# Patient Record
Sex: Female | Born: 1980 | Race: White | Hispanic: No | Marital: Married | Smoking: Former smoker
Health system: Southern US, Community
[De-identification: ages and names within clinical notes are randomized; demographics above are authoritative.]

## PROBLEM LIST (undated history)

## (undated) DIAGNOSIS — K219 Gastro-esophageal reflux disease without esophagitis: Secondary | ICD-10-CM

## (undated) DIAGNOSIS — R0989 Other specified symptoms and signs involving the circulatory and respiratory systems: Secondary | ICD-10-CM

## (undated) DIAGNOSIS — T7840XA Allergy, unspecified, initial encounter: Secondary | ICD-10-CM

## (undated) DIAGNOSIS — G43909 Migraine, unspecified, not intractable, without status migrainosus: Secondary | ICD-10-CM

## (undated) DIAGNOSIS — E282 Polycystic ovarian syndrome: Secondary | ICD-10-CM

## (undated) DIAGNOSIS — E119 Type 2 diabetes mellitus without complications: Secondary | ICD-10-CM

## (undated) DIAGNOSIS — J45909 Unspecified asthma, uncomplicated: Secondary | ICD-10-CM

## (undated) HISTORY — DX: Polycystic ovarian syndrome: E28.2

## (undated) HISTORY — DX: Gastro-esophageal reflux disease without esophagitis: K21.9

## (undated) HISTORY — DX: Other specified symptoms and signs involving the circulatory and respiratory systems: R09.89

## (undated) HISTORY — DX: Unspecified asthma, uncomplicated: J45.909

## (undated) HISTORY — DX: Allergy, unspecified, initial encounter: T78.40XA

## (undated) HISTORY — PX: CHOLECYSTECTOMY: SHX55

## (undated) HISTORY — DX: Migraine, unspecified, not intractable, without status migrainosus: G43.909

---
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
## 2000-09-28 HISTORY — PX: OVARIAN CYST REMOVAL: SHX89

## 2006-05-26 ENCOUNTER — Ambulatory Visit: Payer: Self-pay | Admitting: Unknown Physician Specialty

## 2006-07-29 ENCOUNTER — Emergency Department: Payer: Self-pay | Admitting: General Practice

## 2008-03-27 ENCOUNTER — Emergency Department: Payer: Self-pay | Admitting: Emergency Medicine

## 2008-03-28 ENCOUNTER — Other Ambulatory Visit: Payer: Self-pay

## 2008-08-26 ENCOUNTER — Emergency Department: Payer: Self-pay | Admitting: Internal Medicine

## 2012-08-08 ENCOUNTER — Ambulatory Visit: Payer: Self-pay | Admitting: Family Medicine

## 2012-09-04 ENCOUNTER — Emergency Department: Payer: Self-pay | Admitting: Emergency Medicine

## 2012-09-04 LAB — URINALYSIS, COMPLETE
Bilirubin,UR: NEGATIVE
Blood: NEGATIVE
Glucose,UR: NEGATIVE mg/dL (ref 0–75)
Ketone: NEGATIVE
Nitrite: NEGATIVE
Protein: NEGATIVE
RBC,UR: NONE SEEN /HPF (ref 0–5)
Specific Gravity: 1.003 (ref 1.003–1.030)

## 2012-09-06 LAB — URINE CULTURE

## 2012-09-27 ENCOUNTER — Ambulatory Visit: Payer: Self-pay | Admitting: Family Medicine

## 2012-10-20 ENCOUNTER — Ambulatory Visit: Payer: Self-pay

## 2012-10-27 ENCOUNTER — Ambulatory Visit: Payer: Self-pay | Admitting: Emergency Medicine

## 2012-11-07 ENCOUNTER — Ambulatory Visit: Payer: Self-pay | Admitting: Emergency Medicine

## 2012-11-07 LAB — HCG, QUANTITATIVE, PREGNANCY: Beta Hcg, Quant.: <1 m[IU]/mL — ABNORMAL LOW

## 2012-11-29 HISTORY — PX: UPPER GI ENDOSCOPY: SHX6162

## 2013-01-25 ENCOUNTER — Ambulatory Visit: Payer: Self-pay | Admitting: Oncology

## 2013-01-25 LAB — CBC CANCER CENTER
Basophil #: 0.1 x10 3/mm (ref 0.0–0.1)
Basophil %: 0.8 %
Eosinophil #: 0 x10 3/mm (ref 0.0–0.7)
HCT: 38.9 % (ref 35.0–47.0)
Lymphocyte #: 2.2 x10 3/mm (ref 1.0–3.6)
Lymphocyte %: 27.9 %
MCHC: 33.6 g/dL (ref 32.0–36.0)
MCV: 84 fL (ref 80–100)
Monocyte #: 0.5 x10 3/mm (ref 0.2–0.9)
Neutrophil #: 5.2 x10 3/mm (ref 1.4–6.5)
Platelet: 478 x10 3/mm — ABNORMAL HIGH (ref 150–440)
RDW: 12.6 % (ref 11.5–14.5)

## 2013-01-25 LAB — LACTATE DEHYDROGENASE: LDH: 146 U/L (ref 81–246)

## 2013-01-26 ENCOUNTER — Ambulatory Visit: Payer: Self-pay | Admitting: Oncology

## 2013-04-26 ENCOUNTER — Ambulatory Visit: Payer: Self-pay | Admitting: Oncology

## 2013-04-26 LAB — CBC CANCER CENTER
Basophil %: 0.5 %
Eosinophil %: 0.5 %
HGB: 13.6 g/dL (ref 12.0–16.0)
Lymphocyte #: 2.7 x10 3/mm (ref 1.0–3.6)
Lymphocyte %: 22 %
MCHC: 35.1 g/dL (ref 32.0–36.0)
MCV: 86 fL (ref 80–100)
Monocyte #: 0.8 x10 3/mm (ref 0.2–0.9)
Neutrophil #: 8.8 x10 3/mm — ABNORMAL HIGH (ref 1.4–6.5)
Neutrophil %: 70.9 %
RDW: 13.1 % (ref 11.5–14.5)

## 2013-04-28 ENCOUNTER — Ambulatory Visit: Payer: Self-pay | Admitting: Oncology

## 2013-05-18 ENCOUNTER — Ambulatory Visit (INDEPENDENT_AMBULATORY_CARE_PROVIDER_SITE_OTHER): Payer: BC Managed Care – PPO | Admitting: General Surgery

## 2013-05-18 ENCOUNTER — Encounter: Payer: Self-pay | Admitting: General Surgery

## 2013-05-18 VITALS — BP 98/58 | HR 92 | Resp 12 | Ht 67.0 in | Wt 187.0 lb

## 2013-05-18 DIAGNOSIS — K811 Chronic cholecystitis: Secondary | ICD-10-CM

## 2013-05-18 NOTE — Patient Instructions (Addendum)
Laparoscopic Cholecystectomy Laparoscopic cholecystectomy is surgery to remove the gallbladder. The gallbladder is located slightly to the right of center in the abdomen, behind the liver. It is a concentrating and storage sac for the bile produced in the liver. Bile aids in the digestion and absorption of fats. Gallbladder disease (cholecystitis) is an inflammation of your gallbladder. This condition is usually caused by a buildup of gallstones (cholelithiasis) in your gallbladder. Gallstones can block the flow of bile, resulting in inflammation and pain. In severe cases, emergency surgery may be required. When emergency surgery is not required, you will have time to prepare for the procedure. Laparoscopic surgery is an alternative to open surgery. Laparoscopic surgery usually has a shorter recovery time. Your common bile duct may also need to be examined and explored. Your caregiver will discuss this with you if he or she feels this should be done. If stones are found in the common bile duct, they may be removed. LET YOUR CAREGIVER KNOW ABOUT:  Allergies to food or medicine.  Medicines taken, including vitamins, herbs, eyedrops, over-the-counter medicines, and creams.  Use of steroids (by mouth or creams).  Previous problems with anesthetics or numbing medicines.  History of bleeding problems or blood clots.  Previous surgery.  Other health problems, including diabetes and kidney problems.  Possibility of pregnancy, if this applies. RISKS AND COMPLICATIONS All surgery is associated with risks. Some problems that may occur following this procedure include:  Infection.  Damage to the common bile duct, nerves, arteries, veins, or other internal organs such as the stomach or intestines.  Bleeding.  A stone may remain in the common bile duct. BEFORE THE PROCEDURE  Do not take aspirin for 3 days prior to surgery or blood thinners for 1 week prior to surgery.  Do not eat or drink  anything after midnight the night before surgery.  Let your caregiver know if you develop a cold or other infectious problem prior to surgery.  You should be present 60 minutes before the procedure or as directed. PROCEDURE  You will be given medicine that makes you sleep (general anesthetic). When you are asleep, your surgeon will make several small cuts (incisions) in your abdomen. One of these incisions is used to insert a small, lighted scope (laparoscope) into the abdomen. The laparoscope helps the surgeon see into your abdomen. Carbon dioxide gas will be pumped into your abdomen. The gas allows more room for the surgeon to perform your surgery. Other operating instruments are inserted through the other incisions. Laparoscopic procedures may not be appropriate when:  There is major scarring from previous surgery.  The gallbladder is extremely inflamed.  There are bleeding disorders or unexpected cirrhosis of the liver.  A pregnancy is near term.  Other conditions make the laparoscopic procedure impossible. If your surgeon feels it is not safe to continue with a laparoscopic procedure, he or she will perform an open abdominal procedure. In this case, the surgeon will make an incision to open the abdomen. This gives the surgeon a larger view and field to work within. This may allow the surgeon to perform procedures that sometimes cannot be performed with a laparoscope alone. Open surgery has a longer recovery time. AFTER THE PROCEDURE  You will be taken to the recovery area where a nurse will watch and check your progress.  You may be allowed to go home the same day.  Do not resume physical activities until directed by your caregiver.  You may resume a normal diet and   activities as directed. Document Released: 09/14/2005 Document Revised: 12/07/2011 Document Reviewed: 02/27/2011 [facility] Patient Information 2014 [place], [place].   Laparoscopic Cholecystectomy with Intraoperative  Cholangiogram. The procedure, including it's potential risks and complications (including but not limited to infection, bleeding, injury to intra-abdominal organs or bile ducts, bile leak, poor cosmetic result, sepsis and death) were discussed with the patient in detail. Non-operative options, including their inherent risks (acute calculous cholecystitis with possible choledocholithiasis or gallstone pancreatitis, with the risk of ascending cholangitis, sepsis, and death) were discussed as well. The patient expressed and understanding of what we discussed and wishes to proceed with laparoscopic cholecystectomy. The patient further understands that if it is technically not possible, or it is unsafe to proceed laparoscopically, that I will convert to an open cholecystectomy.

## 2013-05-18 NOTE — Progress Notes (Signed)
Patient ID: LILLIANNE EICK, female   DOB: 02-20-1981, 32 y.o.   MRN: [id number]  Chief Complaint  Patient presents with  . Other    chronic cholecystitis    HPI ALAENA STRADER is a 32 y.o. female who presents for an evaluation of the gallbladder referred by Owens Shark NP. States that for about 1 year on/off she has been having right abdominal pain and "gallbladder attacks".  States she has severe pain RUQ and to the back with nausea, vomiting and diarrhea with gas and pressure.  Seems to be triggered by all foods. Symptoms usually last for about one week.  States weight has been up and down.  Diagnosed with GERD during this time and Protonix was increased to twice a day.  She had a HIDA scan CT scan and ultrasound done.  Records from the GI service were reviewed. The case was discussed with Owens Shark, NP from GI. HPI  Past Medical History  Diagnosis Date  . GERD (gastroesophageal reflux disease)   . PCOS (polycystic ovarian syndrome)   . Allergy   . Sinus complaint     Past Surgical History  Procedure Laterality Date  . Ovarian cyst removal Bilateral 2002  . Upper gi endoscopy  11-29-12    Dr Bluford Kaufmann    Family History  Problem Relation Age of Onset  . Diabetes Mother   . Diabetes Father     Social History History  Substance Use Topics  . Smoking status: Former Smoker -- 12 years  . Smokeless tobacco: Not on file  . Alcohol Use: Yes    No Known Allergies  Current Outpatient Prescriptions  Medication Sig Dispense Refill  . acetaminophen (TYLENOL) 500 MG tablet Take 500 mg by mouth every 6 (six) hours as needed for pain.      Marland Kitchen albuterol (PROVENTIL HFA;VENTOLIN HFA) 108 (90 BASE) MCG/ACT inhaler Inhale 2 puffs into the lungs every 6 (six) hours as needed for wheezing.      Marland Kitchen aspirin 81 MG tablet Take 81 mg by mouth daily.      . cetirizine (ZYRTEC) 10 MG tablet Take 10 mg by mouth daily.      . citalopram (CELEXA) 20 MG tablet Take 20 mg by mouth daily.      .  diphenhydrAMINE (BENADRYL) 25 mg capsule Take 25 mg by mouth every 6 (six) hours as needed for itching.      . fluticasone (FLONASE) 50 MCG/ACT nasal spray Place 2 sprays into the nose daily.      Marland Kitchen levonorgestrel-ethinyl estradiol (NORDETTE) 0.15-30 MG-MCG tablet Take 1 tablet by mouth daily.      . Melatonin 3 MG TABS Take 1 tablet by mouth at bedtime.      . metFORMIN (GLUCOPHAGE) 500 MG tablet Take 500 mg by mouth 2 (two) times daily with a meal.      . montelukast (SINGULAIR) 10 MG tablet Take 10 mg by mouth at bedtime.      . ondansetron (ZOFRAN) 8 MG tablet Take 8 mg by mouth every 8 (eight) hours as needed for nausea.      . pantoprazole (PROTONIX) 20 MG tablet Take 20 mg by mouth 2 (two) times daily.      . phentermine 37.5 MG capsule Take 37.5 mg by mouth daily.      . Simethicone (GAS RELIEF PO) Take 1 capsule by mouth daily.      . SUMAtriptan (IMITREX) 100 MG tablet Take 100 mg by mouth daily as  needed for migraine.       No current facility-administered medications for this visit.    Review of Systems Review of Systems  Constitutional: Negative.   Respiratory: Negative.   Cardiovascular: Negative.     Blood pressure 98/58, pulse 92, resp. rate 12, height 5\' 7"  (1.702 m), weight 187 lb (84.823 kg), last menstrual period 05/03/2013.  Physical Exam Physical Exam  Constitutional: She is oriented to person, place, and time. She appears well-developed and well-nourished.  Eyes: Conjunctivae are normal.  Neck: Neck supple.  Cardiovascular: Normal rate and regular rhythm.   Pulmonary/Chest: Effort normal and breath sounds normal.  Abdominal: Bowel sounds are normal. There is tenderness in the right upper quadrant.  Lymphadenopathy:    She has no cervical adenopathy.  Neurological: She is alert and oriented to person, place, and time.  Skin: Skin is warm and dry.    Data Reviewed The patient's previous HIDA scan, abdominal ultrasound and CT scans were reviewed. The upper  endoscopy report was reviewed. Biopsies from her upper endoscopy showed evidence of active chronic inflammation of gastric mucosa.  Laboratory studies dated November 22, 2012 showed a hemoglobin A1c of 5.8 Loprox is upper limit of normal 5.6. Normal basic metabolic panel. Normal TSH. Gallbladder wall thickness 2.2 mm, upper limit of normal. No gallstones. The appendix ejection fraction 50% with reproduction of symptoms. CT scan of the abdomen and pelvis showed no acute abnormality.  Assessment    Chronic right upper quadrant pain, possible chronic cholecystitis.     Plan    The patient has no lower GI symptoms to suggest colonic spasm as the source of her pain. She has had a complete and thorough diagnostic workup with the only finding being reproduction of her symptoms with CCK injection and a slightly thickened gallbladder wall.  The possibility of relief of her symptoms with lateral cholecystectomy is estimated at 50-75%. A good deal of time was spent taken today to the patient that there is no guarantee that cholecystectomy will relieve all of her symptoms.  Special emphasis was placed on the risks of the procedure, not so much as to scare her away from elective surgery, to be sure that she was not grasping at a last straw.  Uncomfortable she is aware of the risks of the procedure and understands that no guarantee as been made regarding relief of symptoms.       Earline Mayotte 05/19/2013, 9:11 PM

## 2013-05-19 ENCOUNTER — Other Ambulatory Visit: Payer: Self-pay | Admitting: General Surgery

## 2013-05-19 ENCOUNTER — Encounter: Payer: Self-pay | Admitting: General Surgery

## 2013-05-19 DIAGNOSIS — K811 Chronic cholecystitis: Secondary | ICD-10-CM

## 2013-05-19 HISTORY — DX: Chronic cholecystitis: K81.1

## 2013-06-09 ENCOUNTER — Ambulatory Visit: Payer: Self-pay | Admitting: General Surgery

## 2013-06-09 DIAGNOSIS — K811 Chronic cholecystitis: Secondary | ICD-10-CM

## 2013-06-09 HISTORY — PX: CHOLECYSTECTOMY: SHX55

## 2013-06-12 ENCOUNTER — Encounter: Payer: Self-pay | Admitting: General Surgery

## 2013-06-12 LAB — PATHOLOGY REPORT

## 2013-06-13 ENCOUNTER — Encounter: Payer: Self-pay | Admitting: General Surgery

## 2013-06-14 ENCOUNTER — Encounter: Payer: Self-pay | Admitting: General Surgery

## 2013-06-21 ENCOUNTER — Ambulatory Visit: Payer: BC Managed Care – PPO | Admitting: General Surgery

## 2013-06-29 ENCOUNTER — Encounter: Payer: Self-pay | Admitting: General Surgery

## 2013-06-29 ENCOUNTER — Ambulatory Visit (INDEPENDENT_AMBULATORY_CARE_PROVIDER_SITE_OTHER): Payer: BC Managed Care – PPO | Admitting: General Surgery

## 2013-06-29 VITALS — BP 130/90 | HR 76 | Resp 16 | Ht 67.0 in | Wt 192.0 lb

## 2013-06-29 DIAGNOSIS — K811 Chronic cholecystitis: Secondary | ICD-10-CM

## 2013-06-29 NOTE — Progress Notes (Signed)
Patient ID: Ashley Kidd, female   DOB: 03-26-1981, 32 y.o.   MRN: [id number]  Chief Complaint  Patient presents with  . Routine Post Op    HPI Ashley Kidd is a 32 y.o. female.  Postoperative visit laparoscopy cholecystectomy done 06-09-13. States she is so glad she had surgery and feels so much better now and is able to eat. The adhesives irritated her skin some but it is better now.  HPI  Past Medical History  Diagnosis Date  . GERD (gastroesophageal reflux disease)   . PCOS (polycystic ovarian syndrome)   . Allergy   . Sinus complaint     Past Surgical History  Procedure Laterality Date  . Ovarian cyst removal Bilateral 2002  . Upper gi endoscopy  11-29-12    Dr Bluford Kaufmann  . Cholecystectomy  06-09-13    Family History  Problem Relation Age of Onset  . Diabetes Mother   . Diabetes Father     Social History History  Substance Use Topics  . Smoking status: Former Smoker -- 12 years  . Smokeless tobacco: Not on file  . Alcohol Use: Yes    No Known Allergies  Current Outpatient Prescriptions  Medication Sig Dispense Refill  . acetaminophen (TYLENOL) 500 MG tablet Take 500 mg by mouth every 6 (six) hours as needed for pain.      Marland Kitchen albuterol (PROVENTIL HFA;VENTOLIN HFA) 108 (90 BASE) MCG/ACT inhaler Inhale 2 puffs into the lungs every 6 (six) hours as needed for wheezing.      Marland Kitchen aspirin 81 MG tablet Take 81 mg by mouth daily.      . cetirizine (ZYRTEC) 10 MG tablet Take 10 mg by mouth daily.      . citalopram (CELEXA) 20 MG tablet Take 20 mg by mouth daily.      . diphenhydrAMINE (BENADRYL) 25 mg capsule Take 25 mg by mouth every 6 (six) hours as needed for itching.      . fluticasone (FLONASE) 50 MCG/ACT nasal spray Place 2 sprays into the nose daily.      Marland Kitchen levonorgestrel-ethinyl estradiol (NORDETTE) 0.15-30 MG-MCG tablet Take 1 tablet by mouth daily.      . Melatonin 3 MG TABS Take 1 tablet by mouth at bedtime.      . metFORMIN (GLUCOPHAGE) 500 MG tablet Take 500 mg by  mouth 2 (two) times daily with a meal.      . montelukast (SINGULAIR) 10 MG tablet Take 10 mg by mouth at bedtime.      . pantoprazole (PROTONIX) 20 MG tablet Take 20 mg by mouth 2 (two) times daily.      . phentermine 37.5 MG capsule Take 37.5 mg by mouth daily.      . SUMAtriptan (IMITREX) 100 MG tablet Take 100 mg by mouth daily as needed for migraine.       No current facility-administered medications for this visit.    Review of Systems Review of Systems  Blood pressure 130/90, pulse 76, resp. rate 16, height 5\' 7"  (1.702 m), weight 192 lb (87.091 kg), last menstrual period 06/29/2013.  Physical Exam Physical Exam  Constitutional: She is oriented to person, place, and time. She appears well-developed and well-nourished.  Pulmonary/Chest: Effort normal and breath sounds normal.  Abdominal: Soft. Normal appearance.  All incisions are healing well.  Neurological: She is alert and oriented to person, place, and time.  Skin: Skin is warm and dry.   The skin inferior to the medial lateral incision shows  a small less than 1 cm square area of depigmentation.  Data Reviewed Pathology showed chronic cholecystitis and cholelithiasis. (Previous ultrasound had not shown evidence of gallstones). Assessment    Doing well status post cholecystectomy.    Plan    Care with resumption of her exercise program was emphasized.       Earline Mayotte 06/29/2013, 12:17 PM

## 2013-06-29 NOTE — Patient Instructions (Signed)
The patient is aware to call back for any questions or concerns. Use one arm or one leg at a time for weight lifting.

## 2013-07-26 ENCOUNTER — Ambulatory Visit: Payer: Self-pay | Admitting: Oncology

## 2013-07-26 LAB — CBC CANCER CENTER
Basophil %: 0.6 %
Eosinophil #: 0 x10 3/mm (ref 0.0–0.7)
HCT: 41.5 % (ref 35.0–47.0)
Lymphocyte %: 29.4 %
MCH: 29.1 pg (ref 26.0–34.0)
MCHC: 33.4 g/dL (ref 32.0–36.0)
MCV: 87 fL (ref 80–100)
Neutrophil %: 62.3 %
RBC: 4.75 10*6/uL (ref 3.80–5.20)
WBC: 8 x10 3/mm (ref 3.6–11.0)

## 2013-07-29 ENCOUNTER — Ambulatory Visit: Payer: Self-pay | Admitting: Oncology

## 2013-10-25 ENCOUNTER — Ambulatory Visit: Payer: Self-pay | Admitting: Oncology

## 2013-10-25 LAB — CBC CANCER CENTER
Basophil #: 0.1 x10 3/mm (ref 0.0–0.1)
Basophil %: 0.6 %
EOS ABS: 0.1 x10 3/mm (ref 0.0–0.7)
EOS PCT: 0.7 %
HCT: 40.8 % (ref 35.0–47.0)
HGB: 13.8 g/dL (ref 12.0–16.0)
LYMPHS ABS: 2.7 x10 3/mm (ref 1.0–3.6)
LYMPHS PCT: 28.2 %
MCH: 29.2 pg (ref 26.0–34.0)
MCHC: 33.7 g/dL (ref 32.0–36.0)
MCV: 86 fL (ref 80–100)
MONO ABS: 0.6 x10 3/mm (ref 0.2–0.9)
MONOS PCT: 6.4 %
NEUTROS ABS: 6.2 x10 3/mm (ref 1.4–6.5)
NEUTROS PCT: 64.1 %
PLATELETS: 484 x10 3/mm — AB (ref 150–440)
RBC: 4.72 10*6/uL (ref 3.80–5.20)
RDW: 12.8 % (ref 11.5–14.5)
WBC: 9.7 x10 3/mm (ref 3.6–11.0)

## 2013-10-29 ENCOUNTER — Ambulatory Visit: Payer: Self-pay | Admitting: Oncology

## 2014-01-24 ENCOUNTER — Ambulatory Visit: Payer: Self-pay | Admitting: Oncology

## 2014-01-24 LAB — CBC CANCER CENTER
BASOS ABS: 0.1 x10 3/mm (ref 0.0–0.1)
Basophil %: 0.6 %
Eosinophil #: 0.1 x10 3/mm (ref 0.0–0.7)
Eosinophil %: 1.1 %
HCT: 41 % (ref 35.0–47.0)
HGB: 13.5 g/dL (ref 12.0–16.0)
Lymphocyte #: 2.7 x10 3/mm (ref 1.0–3.6)
Lymphocyte %: 28.2 %
MCH: 28.6 pg (ref 26.0–34.0)
MCHC: 33 g/dL (ref 32.0–36.0)
MCV: 87 fL (ref 80–100)
MONO ABS: 0.7 x10 3/mm (ref 0.2–0.9)
Monocyte %: 7.4 %
Neutrophil #: 6 x10 3/mm (ref 1.4–6.5)
Neutrophil %: 62.7 %
PLATELETS: 466 x10 3/mm — AB (ref 150–440)
RBC: 4.73 10*6/uL (ref 3.80–5.20)
RDW: 13.1 % (ref 11.5–14.5)
WBC: 9.5 x10 3/mm (ref 3.6–11.0)

## 2014-01-26 ENCOUNTER — Ambulatory Visit: Payer: Self-pay | Admitting: Oncology

## 2014-04-25 ENCOUNTER — Ambulatory Visit: Payer: Self-pay | Admitting: Oncology

## 2014-04-25 LAB — CBC CANCER CENTER
BASOS ABS: 0 x10 3/mm (ref 0.0–0.1)
Basophil %: 0.5 %
EOS ABS: 0.1 x10 3/mm (ref 0.0–0.7)
Eosinophil %: 0.9 %
HCT: 39.5 % (ref 35.0–47.0)
HGB: 13.3 g/dL (ref 12.0–16.0)
Lymphocyte #: 2 x10 3/mm (ref 1.0–3.6)
Lymphocyte %: 21.3 %
MCH: 28.9 pg (ref 26.0–34.0)
MCHC: 33.6 g/dL (ref 32.0–36.0)
MCV: 86 fL (ref 80–100)
Monocyte #: 0.5 x10 3/mm (ref 0.2–0.9)
Monocyte %: 5 %
NEUTROS PCT: 72.3 %
Neutrophil #: 6.8 x10 3/mm — ABNORMAL HIGH (ref 1.4–6.5)
PLATELETS: 475 x10 3/mm — AB (ref 150–440)
RBC: 4.6 10*6/uL (ref 3.80–5.20)
RDW: 13.3 % (ref 11.5–14.5)
WBC: 9.5 x10 3/mm (ref 3.6–11.0)

## 2014-04-28 ENCOUNTER — Ambulatory Visit: Payer: Self-pay | Admitting: Oncology

## 2014-07-30 ENCOUNTER — Encounter: Payer: Self-pay | Admitting: General Surgery

## 2014-11-23 ENCOUNTER — Ambulatory Visit: Payer: Self-pay

## 2015-01-03 ENCOUNTER — Ambulatory Visit: Payer: Self-pay | Admitting: Podiatry

## 2015-01-18 NOTE — Op Note (Signed)
PATIENT NAME:  Ashley Kidd, Ashley Kidd MR#:  [id number] DATE OF BIRTH:  Nov 08, 1980  DATE OF PROCEDURE:  06/09/2013  PREOPERATIVE DIAGNOSIS:  Chronic right upper quadrant abdominal pain.   POSTOPERATIVE DIAGNOSIS:  Chronic right upper quadrant abdominal pain.  OPERATIVE PROCEDURE:  Laparoscopic cholecystectomy with intraoperative cholangiogram.   SURGEON:  Hervey Ard, M.D.   ANESTHESIA:  General endotracheal under Dr. Myra Gianotti.   ESTIMATED BLOOD LOSS:  Minimal.   CLINICAL NOTE:  This 34 year old woman has had a significant history of postprandial pain confined to the right upper quadrant.  No particular dietary intolerance had been noted.  Upper endoscopy, CT and ultrasound were unremarkable.  While her gallbladder ejection fraction was 50%, there was reproduction of her symptoms with the CCK injection and she was felt to be a candidate for elective cholecystectomy.   OPERATIVE NOTE:  With the patient under adequate general anesthesia, the abdomen was prepped with ChloraPrep and draped.  A Veress needle was placed through a transumbilical incision after ensuring intra-abdominal location with the hanging drop test, the abdomen was insufflated with CO2 at 10 mmHg pressure.  Inspection after placement of a 10 mm step port showed no evidence of injury from initial port placement.  The anterior abdominal wall was unremarkable.  The patient was placed into reverse Trendelenburg position and rolled to the left.  An 11 mm Xcel port was placed in the epigastrium and two 5 mm step ports placed laterally.  The gallbladder was placed on a cephalad traction.  There was noted to be significant adhesions between the second portion of the duodenum, just past the duodenal bulb and the neck of the gallbladder.  This was carefully taken down with blunt and cautery dissection.  Inspection showed no evidence of a fistula between the duodenum and the gallbladder.  The area of the duodenum was re-examined at the end of the  procedure and was found to be normal.  The scar tissue around the neck of the gallbladder was cleared and fluoroscopic cholangiograms completed using 8 mL of one-half strength Conray 60.  This showed prompt filling of the right and left hepatic ducts and free flow into the distal common bile duct and into the duodenum.  No evidence of stones.  The cystic duct and multiple small branches of the cystic artery were clipped adjacent to the gallbladder.  It was then removed from the liver bed making use of hook cautery dissection.  It was delivered through the umbilical port site without incident.  Inspection from the epigastric site showed no evidence of injury from initial port placement.  The right upper quadrant was irrigated as a modest amount of bile had been spilled during aspiration of the moderately distended gallbladder at the beginning of the procedure.  Hemostasis was excellent.  The abdomen was then desufflated and ports removed under direct vision.  Skin incisions were closed with 4-0 Vicryl subcuticular sutures.  Benzoin, Steri-Strips, Telfa and Tegaderm dressings were applied.    The patient tolerated the procedure well and was taken to the recovery room in stable condition.    ____________________________ Robert Bellow, MD jwb:ea D: 06/09/2013 16:27:06 ET T: 06/09/2013 23:30:51 ET JOB#: [id number]  cc: Robert Bellow, MD, <Dictator> Payton Emerald, NP Outside Physician Guadalupe Maple, MD Robert Bellow, MD Santosha Jividen Amedeo Kinsman MD ELECTRONICALLY SIGNED 06/11/2013 10:20

## 2015-03-26 ENCOUNTER — Ambulatory Visit (INDEPENDENT_AMBULATORY_CARE_PROVIDER_SITE_OTHER): Payer: Managed Care, Other (non HMO) | Admitting: Family Medicine

## 2015-03-26 ENCOUNTER — Encounter: Payer: Self-pay | Admitting: Family Medicine

## 2015-03-26 VITALS — BP 137/89 | HR 123 | Temp 98.1°F | Wt 221.4 lb

## 2015-03-26 DIAGNOSIS — M545 Low back pain, unspecified: Secondary | ICD-10-CM | POA: Insufficient documentation

## 2015-03-26 MED ORDER — IBUPROFEN 600 MG PO TABS: 600.0000 mg | ORAL_TABLET | Freq: Three times a day (TID) | ORAL | Status: DC | PRN

## 2015-03-26 MED ORDER — CYCLOBENZAPRINE HCL 10 MG PO TABS
10.0000 mg | ORAL_TABLET | Freq: Three times a day (TID) | ORAL | Status: DC | PRN
Start: 2015-03-26 — End: 2015-09-03

## 2015-03-26 NOTE — Progress Notes (Signed)
BP 137/89 mmHg  Pulse 123  Temp(Src) 98.1 F (36.7 C) (Oral)  Wt 221 lb 6.4 oz (100.426 kg)  SpO2 98%  LMP 03/08/2015   Subjective:    Patient ID: Ashley Kidd, female    DOB: [birth year], 34 y.o.   MRN: [id number]  HPI: Ashley Kidd is a 34 y.o. female  Chief Complaint  Patient presents with  . Back Pain    Spine hurt toward middle of back   BACK PAIN Duration: 5 days Mechanism of injury: no trauma Location: middle of her back and bilateral Onset: sudden Severity: severe Quality: sharp and dull Frequency: constant Radiation: none Aggravating factors: bending and prolonged sitting Alleviating factors: nothing Status: stable Treatments attempted: rest, ice, heat, APAP, ibuprofen and aleve  Relief with NSAIDs?: no Nighttime pain:  no Paresthesias / decreased sensation:  no Bowel / bladder incontinence:  no Fevers:  no Dysuria / urinary frequency:  no  Relevant past medical, surgical, family and social history reviewed and updated as indicated. Interim medical history since our last visit reviewed. Allergies and medications reviewed and updated.  Review of Systems  HENT: Negative.   Respiratory: Negative.   Cardiovascular: Negative.   Musculoskeletal: Positive for myalgias, back pain and gait problem. Negative for joint swelling, arthralgias, neck pain and neck stiffness.  Neurological: Negative.   Psychiatric/Behavioral: Negative.     Per HPI unless specifically indicated above     Objective:    BP 137/89 mmHg  Pulse 123  Temp(Src) 98.1 F (36.7 C) (Oral)  Wt 221 lb 6.4 oz (100.426 kg)  SpO2 98%  LMP 03/08/2015  Wt Readings from Last 3 Encounters:  03/26/15 221 lb 6.4 oz (100.426 kg)  06/29/13 192 lb (87.091 kg)  05/18/13 187 lb (84.823 kg)    Physical Exam  Constitutional: She is oriented to person, place, and time. She appears well-developed and well-nourished. No distress.  HENT:  Head: Normocephalic and atraumatic.  Right Ear: Hearing  normal.  Left Ear: Hearing normal.  Nose: Nose normal.  Eyes: Conjunctivae and lids are normal. Right eye exhibits no discharge. Left eye exhibits no discharge. No scleral icterus.  Pulmonary/Chest: Effort normal. No respiratory distress.  Neurological: She is alert and oriented to person, place, and time.  Skin: Skin is warm, dry and intact. No rash noted. No erythema. No pallor.  Psychiatric: She has a normal mood and affect. Her speech is normal and behavior is normal. Judgment and thought content normal. Cognition and memory are normal.  Back Exam:    Inspection:  Normal spinal curvature.  No deformity, ecchymosis, erythema, or lesions paraspinal muscle spasm T10-L4 bilaterally R.>L    Palpation:     Midline spinal tenderness: no      Paralumbar tenderness: yesR>L     Parathoracic tenderness: yesR>L     Buttocks tenderness: no     Range of Motion:      Flexion: Fingers to Knees     Extension:Decreased     Lateral bending:Decreased    Rotation:Decreased    Neuro Exam:Lower extremity DTRs normal & symmetric.  Strength and sensation intact.    Special Tests:      Straight leg raise:negative     Assessment & Plan:   Problem List Items Addressed This Visit      Other   Low back pain - Primary    Patient seems to be in an acute exacerbation of her back pain. Information about stretching given to patient. Will start her on 600mg   of ibuprofen TID and cyclobenzaprine TID when she doesn't have to operate heavy machinery or be responsible for anything. Work on stretching. Don't sit for too long. Follow up in 1 week if not doing better.        Relevant Medications   ibuprofen (ADVIL,MOTRIN) 600 MG tablet   cyclobenzaprine (FLEXERIL) 10 MG tablet       Follow up plan: Return in about 1 week (around 04/02/2015).

## 2015-03-26 NOTE — Patient Instructions (Signed)
Back Exercises These exercises may help you when beginning to rehabilitate your injury. Your symptoms may resolve with or without further involvement from your physician, physical therapist or athletic trainer. While completing these exercises, remember:   Restoring tissue flexibility helps normal motion to return to the joints. This allows healthier, less painful movement and activity.  An effective stretch should be held for at least 30 seconds.  A stretch should never be painful. You should only feel a gentle lengthening or release in the stretched tissue. STRETCH - Extension, Prone on Elbows   Lie on your stomach on the floor, a bed will be too soft. Place your palms about shoulder width apart and at the height of your head.  Place your elbows under your shoulders. If this is too painful, stack pillows under your chest.  Allow your body to relax so that your hips drop lower and make contact more completely with the floor.  Hold this position for __________ seconds.  Slowly return to lying flat on the floor. Repeat __________ times. Complete this exercise __________ times per day.  RANGE OF MOTION - Extension, Prone Press Ups   Lie on your stomach on the floor, a bed will be too soft. Place your palms about shoulder width apart and at the height of your head.  Keeping your back as relaxed as possible, slowly straighten your elbows while keeping your hips on the floor. You may adjust the placement of your hands to maximize your comfort. As you gain motion, your hands will come more underneath your shoulders.  Hold this position __________ seconds.  Slowly return to lying flat on the floor. Repeat __________ times. Complete this exercise __________ times per day.  RANGE OF MOTION- Quadruped, Neutral Spine   Assume a hands and knees position on a firm surface. Keep your hands under your shoulders and your knees under your hips. You may place padding under your knees for  comfort.  Drop your head and point your tail bone toward the ground below you. This will round out your low back like an angry cat. Hold this position for __________ seconds.  Slowly lift your head and release your tail bone so that your back sags into a large arch, like an old horse.  Hold this position for __________ seconds.  Repeat this until you feel limber in your low back.  Now, find your "sweet spot." This will be the most comfortable position somewhere between the two previous positions. This is your neutral spine. Once you have found this position, tense your stomach muscles to support your low back.  Hold this position for __________ seconds. Repeat __________ times. Complete this exercise __________ times per day.  STRETCH - Flexion, Single Knee to Chest   Lie on a firm bed or floor with both legs extended in front of you.  Keeping one leg in contact with the floor, bring your opposite knee to your chest. Hold your leg in place by either grabbing behind your thigh or at your knee.  Pull until you feel a gentle stretch in your low back. Hold __________ seconds.  Slowly release your grasp and repeat the exercise with the opposite side. Repeat __________ times. Complete this exercise __________ times per day.  STRETCH - Hamstrings, Standing  Stand or sit and extend your right / left leg, placing your foot on a chair or foot stool  Keeping a slight arch in your low back and your hips straight forward.  Lead with your chest and   lean forward at the waist until you feel a gentle stretch in the back of your right / left knee or thigh. (When done correctly, this exercise requires leaning only a small distance.)  Hold this position for __________ seconds. Repeat __________ times. Complete this stretch __________ times per day. STRENGTHENING - Deep Abdominals, Pelvic Tilt   Lie on a firm bed or floor. Keeping your legs in front of you, bend your knees so they are both pointed  toward the ceiling and your feet are flat on the floor.  Tense your lower abdominal muscles to press your low back into the floor. This motion will rotate your pelvis so that your tail bone is scooping upwards rather than pointing at your feet or into the floor.  With a gentle tension and even breathing, hold this position for __________ seconds. Repeat __________ times. Complete this exercise __________ times per day.  STRENGTHENING - Abdominals, Crunches   Lie on a firm bed or floor. Keeping your legs in front of you, bend your knees so they are both pointed toward the ceiling and your feet are flat on the floor. Cross your arms over your chest.  Slightly tip your chin down without bending your neck.  Tense your abdominals and slowly lift your trunk high enough to just clear your shoulder blades. Lifting higher can put excessive stress on the low back and does not further strengthen your abdominal muscles.  Control your return to the starting position. Repeat __________ times. Complete this exercise __________ times per day.  STRENGTHENING - Quadruped, Opposite UE/LE Lift   Assume a hands and knees position on a firm surface. Keep your hands under your shoulders and your knees under your hips. You may place padding under your knees for comfort.  Find your neutral spine and gently tense your abdominal muscles so that you can maintain this position. Your shoulders and hips should form a rectangle that is parallel with the floor and is not twisted.  Keeping your trunk steady, lift your right hand no higher than your shoulder and then your left leg no higher than your hip. Make sure you are not holding your breath. Hold this position __________ seconds.  Continuing to keep your abdominal muscles tense and your back steady, slowly return to your starting position. Repeat with the opposite arm and leg. Repeat __________ times. Complete this exercise __________ times per day. Document Released:  10/02/2005 Document Revised: 12/07/2011 Document Reviewed: 12/27/2008 [facility] Patient Information 2015 [place], [place]. This information is not intended to replace advice given to you by your health care provider. Make sure you discuss any questions you have with your health care provider. Back Pain, Adult Low back pain is very common. About 1 in 5 people have back pain.The cause of low back pain is rarely dangerous. The pain often gets better over time.About half of people with a sudden onset of back pain feel better in just 2 weeks. About 8 in 10 people feel better by 6 weeks.  CAUSES Some common causes of back pain include:  Strain of the muscles or ligaments supporting the spine.  Wear and tear (degeneration) of the spinal discs.  Arthritis.  Direct injury to the back. DIAGNOSIS Most of the time, the direct cause of low back pain is not known.However, back pain can be treated effectively even when the exact cause of the pain is unknown.Answering your caregiver's questions about your overall health and symptoms is one of the most accurate ways to make sure  the cause of your pain is not dangerous. If your caregiver needs more information, he or she may order lab work or imaging tests (X-rays or MRIs).However, even if imaging tests show changes in your back, this usually does not require surgery. HOME CARE INSTRUCTIONS For many people, back pain returns.Since low back pain is rarely dangerous, it is often a condition that people can learn to [facility] their own.   Remain active. It is stressful on the back to sit or stand in one place. Do not sit, drive, or stand in one place for more than 30 minutes at a time. Take short walks on level surfaces as soon as pain allows.Try to increase the length of time you walk each day.  Do not stay in bed.Resting more than 1 or 2 days can delay your recovery.  Do not avoid exercise or work.Your body is made to move.It is not dangerous to be  active, even though your back may hurt.Your back will likely heal faster if you return to being active before your pain is gone.  Pay attention to your body when you bend and lift. Many people have less discomfortwhen lifting if they bend their knees, keep the load close to their bodies,and avoid twisting. Often, the most comfortable positions are those that put less stress on your recovering back.  Find a comfortable position to sleep. Use a firm mattress and lie on your side with your knees slightly bent. If you lie on your back, put a pillow under your knees.  Only take over-the-counter or prescription medicines as directed by your caregiver. Over-the-counter medicines to reduce pain and inflammation are often the most helpful.Your caregiver may prescribe muscle relaxant drugs.These medicines help dull your pain so you can more quickly return to your normal activities and healthy exercise.  Put ice on the injured area.  Put ice in a plastic bag.  Place a towel between your skin and the bag.  Leave the ice on for 15-20 minutes, 03-04 times a day for the first 2 to 3 days. After that, ice and heat may be alternated to reduce pain and spasms.  Ask your caregiver about trying back exercises and gentle massage. This may be of some benefit.  Avoid feeling anxious or stressed.Stress increases muscle tension and can worsen back pain.It is important to recognize when you are anxious or stressed and learn ways to manage it.Exercise is a great option. SEEK MEDICAL CARE IF:  You have pain that is not relieved with rest or medicine.  You have pain that does not improve in 1 week.  You have new symptoms.  You are generally not feeling well. SEEK IMMEDIATE MEDICAL CARE IF:   You have pain that radiates from your back into your legs.  You develop new bowel or bladder control problems.  You have unusual weakness or numbness in your arms or legs.  You develop nausea or vomiting.  You  develop abdominal pain.  You feel faint. Document Released: 09/14/2005 Document Revised: 03/15/2012 Document Reviewed: 01/16/2014 [facility] Patient Information 2015 [place], [place]. This information is not intended to replace advice given to you by your health care provider. Make sure you discuss any questions you have with your health care provider.

## 2015-03-26 NOTE — Assessment & Plan Note (Signed)
Patient seems to be in an acute exacerbation of her back pain. Information about stretching given to patient. Will start her on 600mg  of ibuprofen TID and cyclobenzaprine TID when she doesn't have to operate heavy machinery or be responsible for anything. Work on stretching. Don't sit for too long. Follow up in 1 week if not doing better.

## 2015-04-04 DIAGNOSIS — T7840XA Allergy, unspecified, initial encounter: Secondary | ICD-10-CM | POA: Insufficient documentation

## 2015-04-04 DIAGNOSIS — G43909 Migraine, unspecified, not intractable, without status migrainosus: Secondary | ICD-10-CM | POA: Insufficient documentation

## 2015-04-04 DIAGNOSIS — J45909 Unspecified asthma, uncomplicated: Secondary | ICD-10-CM | POA: Insufficient documentation

## 2015-04-05 ENCOUNTER — Encounter: Payer: Self-pay | Admitting: Family Medicine

## 2015-04-05 ENCOUNTER — Ambulatory Visit (INDEPENDENT_AMBULATORY_CARE_PROVIDER_SITE_OTHER): Payer: Managed Care, Other (non HMO) | Admitting: Family Medicine

## 2015-04-05 VITALS — BP 133/85 | HR 97 | Temp 98.5°F | Ht 66.7 in | Wt 223.6 lb

## 2015-04-05 DIAGNOSIS — M545 Low back pain, unspecified: Secondary | ICD-10-CM

## 2015-04-05 MED ORDER — IBUPROFEN 600 MG PO TABS: 600.0000 mg | ORAL_TABLET | Freq: Three times a day (TID) | ORAL | Status: DC | PRN

## 2015-04-05 NOTE — Progress Notes (Addendum)
BP 133/85 mmHg  Pulse 97  Temp(Src) 98.5 F (36.9 C)  Ht 5' 6.7" (1.694 m)  Wt 223 lb 9.6 oz (101.424 kg)  BMI 35.34 kg/m2  SpO2 97%  LMP 04/04/2015   Subjective:    Patient ID: Ashley Kidd, female    DOB: [birth year], 34 y.o.   MRN: [id number]  HPI: Ashley Kidd is a 34 y.o. female  Chief Complaint  Patient presents with  . Back Pain    pt was seen for back spasms and stated they have gotten better but they are still there   BACK PAIN- doing a bit better, but still in a lot of spasm. Can only take the cyclobenzaprine on nights that her husband is home because it makes her so sleepy she can't get up with the kids.  Duration: 2 weeks Mechanism of injury: unknown Location: R>L and low back Onset: sudden Severity: severe Quality: sharp, dull and pulling Frequency: constant Radiation: none Aggravating factors: lifting, movement, walking, laying and bending Alleviating factors: rest, ice, heat, NSAIDs and muscle relaxer Status: better Treatments attempted: rest, ice, heat and ibuprofen  Relief with NSAIDs?: moderate Nighttime pain:  no Paresthesias / decreased sensation:  no Bowel / bladder incontinence:  no Fevers:  no Dysuria / urinary frequency:  no  Relevant past medical, surgical, family and social history reviewed and updated as indicated. Interim medical history since our last visit reviewed. Allergies and medications reviewed and updated.  Review of Systems  Constitutional: Negative.   Respiratory: Negative.   Cardiovascular: Negative.   Gastrointestinal: Negative.   Musculoskeletal: Negative.   Psychiatric/Behavioral: Negative.     Per HPI unless specifically indicated above     Objective:    BP 133/85 mmHg  Pulse 97  Temp(Src) 98.5 F (36.9 C)  Ht 5' 6.7" (1.694 m)  Wt 223 lb 9.6 oz (101.424 kg)  BMI 35.34 kg/m2  SpO2 97%  LMP 04/04/2015  Wt Readings from Last 3 Encounters:  04/05/15 223 lb 9.6 oz (101.424 kg)  12/10/14 213 lb (96.616  kg)  03/26/15 221 lb 6.4 oz (100.426 kg)    Physical Exam  Constitutional: She is oriented to person, place, and time. She appears well-developed and well-nourished. No distress.  HENT:  Head: Normocephalic and atraumatic.  Right Ear: Hearing normal.  Left Ear: Hearing normal.  Nose: Nose normal.  Eyes: Conjunctivae and lids are normal. Right eye exhibits no discharge. Left eye exhibits no discharge. No scleral icterus.  Cardiovascular: Normal rate, regular rhythm and normal heart sounds.  Exam reveals no gallop and no friction rub.   No murmur heard. Pulmonary/Chest: Effort normal. No respiratory distress. She has no wheezes. She has no rales. She exhibits no tenderness.  Neurological: She is alert and oriented to person, place, and time.  Skin: Skin is intact. No rash noted.  Psychiatric: She has a normal mood and affect. Her speech is normal and behavior is normal. Judgment and thought content normal. Cognition and memory are normal.  Nursing note and vitals reviewed. Back Exam:    Inspection:  Normal spinal curvature.  No deformity, ecchymosis, erythema, or lesions, paraspinal spasm bilaterally L>R  Palpation:     Midline spinal tenderness: no   Paralumbar tenderness: yesL>R     Parathoracic tenderness: no     Buttocks tenderness: yesL>R     Range of Motion:      Flexion: Fingers to Knees     Extension:Decreased     Lateral bending:Decreased    Rotation:Decreased  Neuro Exam:Lower extremity DTRs normal & symmetric.  Strength and sensation intact.    Special Tests:      Straight leg raise:negative   Results for orders placed or performed in visit on 04/25/14  [facility]  Result Value Ref Range   WBC 9.5 3.6-11.0 x10 3/mm    RBC 4.60 3.80-5.20 x10 6/mm    HGB 13.3 12.0-16.0 g/dL   HCT 39.5 35.0-47.0 %   MCV 86 80-100 fL   MCH 28.9 26.0-34.0 pg   MCHC 33.6 32.0-36.0 g/dL   RDW 13.3 11.5-14.5 %   Platelet 475 (H) 150-440 x10 3/mm    Neutrophil % 72.3 %    Lymphocyte % 21.3 %   Monocyte % 5.0 %   Eosinophil % 0.9 %   Basophil % 0.5 %   Neutrophil # 6.8 (H) 1.4-6.5 x10 3/mm    Lymphocyte # 2.0 1.0-3.6 x10 3/mm    Monocyte # 0.5 0.2-0.9 x10 3/mm    Eosinophil # 0.1 0.0-0.7 x10 3/mm    Basophil # 0.0 0.0-0.1 x10 3/mm       Assessment & Plan:   Problem List Items Addressed This Visit      Other   Low back pain - Primary    Not significantly better. Can't tolerate increased muscle relaxer. Continue NSAID. Continue to monitor. Will send to PT. Referral to [facility] generated today and order given to patient. Follow up in 3 weeks or sooner if not getting better.       Relevant Medications   ibuprofen (ADVIL,MOTRIN) 600 MG tablet       Follow up plan: Return in about 3 weeks (around 04/26/2015).

## 2015-04-05 NOTE — Assessment & Plan Note (Signed)
Not significantly better. Can't tolerate increased muscle relaxer. Continue NSAID. Continue to monitor. Will send to PT. Referral to [facility] generated today and order given to patient. Follow up in 3 weeks or sooner if not getting better.

## 2015-04-29 ENCOUNTER — Ambulatory Visit: Payer: Managed Care, Other (non HMO) | Admitting: Family Medicine

## 2015-06-01 ENCOUNTER — Other Ambulatory Visit: Payer: Self-pay | Admitting: Unknown Physician Specialty

## 2015-08-06 ENCOUNTER — Ambulatory Visit: Payer: Managed Care, Other (non HMO) | Admitting: Unknown Physician Specialty

## 2015-09-03 ENCOUNTER — Emergency Department
Admission: EM | Admit: 2015-09-03 | Discharge: 2015-09-03 | Disposition: A | Discharge status: Home / Self Care | Payer: No Typology Code available for payment source | Attending: Emergency Medicine | Admitting: Emergency Medicine

## 2015-09-03 ENCOUNTER — Emergency Department: Payer: No Typology Code available for payment source

## 2015-09-03 ENCOUNTER — Encounter: Payer: Self-pay | Admitting: *Deleted

## 2015-09-03 DIAGNOSIS — Y9389 Activity, other specified: Secondary | ICD-10-CM | POA: Insufficient documentation

## 2015-09-03 DIAGNOSIS — S161XXA Strain of muscle, fascia and tendon at neck level, initial encounter: Secondary | ICD-10-CM | POA: Insufficient documentation

## 2015-09-03 DIAGNOSIS — S0990XA Unspecified injury of head, initial encounter: Secondary | ICD-10-CM | POA: Insufficient documentation

## 2015-09-03 DIAGNOSIS — Z87891 Personal history of nicotine dependence: Secondary | ICD-10-CM | POA: Insufficient documentation

## 2015-09-03 DIAGNOSIS — Z79899 Other long term (current) drug therapy: Secondary | ICD-10-CM | POA: Insufficient documentation

## 2015-09-03 DIAGNOSIS — S0093XA Contusion of unspecified part of head, initial encounter: Secondary | ICD-10-CM

## 2015-09-03 DIAGNOSIS — Y998 Other external cause status: Secondary | ICD-10-CM | POA: Insufficient documentation

## 2015-09-03 DIAGNOSIS — Z7982 Long term (current) use of aspirin: Secondary | ICD-10-CM | POA: Insufficient documentation

## 2015-09-03 DIAGNOSIS — Z7951 Long term (current) use of inhaled steroids: Secondary | ICD-10-CM | POA: Insufficient documentation

## 2015-09-03 DIAGNOSIS — Y92410 Unspecified street and highway as the place of occurrence of the external cause: Secondary | ICD-10-CM | POA: Insufficient documentation

## 2015-09-03 DIAGNOSIS — Z793 Long term (current) use of hormonal contraceptives: Secondary | ICD-10-CM | POA: Insufficient documentation

## 2015-09-03 DIAGNOSIS — S0083XA Contusion of other part of head, initial encounter: Secondary | ICD-10-CM | POA: Insufficient documentation

## 2015-09-03 DIAGNOSIS — Z7984 Long term (current) use of oral hypoglycemic drugs: Secondary | ICD-10-CM | POA: Insufficient documentation

## 2015-09-03 MED ORDER — CYCLOBENZAPRINE HCL 10 MG PO TABS: 10.0000 mg | ORAL_TABLET | Freq: Three times a day (TID) | ORAL | Status: DC | PRN

## 2015-09-03 MED ORDER — CYCLOBENZAPRINE HCL 10 MG PO TABS
10.0000 mg | ORAL_TABLET | Freq: Once | ORAL | Status: AC
  Administered 2015-09-03: 10 mg via ORAL
  Filled 2015-09-03: qty 1

## 2015-09-03 MED ORDER — IBUPROFEN 800 MG PO TABS
800.0000 mg | ORAL_TABLET | Freq: Once | ORAL | Status: AC
  Administered 2015-09-03: 800 mg via ORAL
  Filled 2015-09-03: qty 1

## 2015-09-03 MED ORDER — IBUPROFEN 800 MG PO TABS: 800.0000 mg | ORAL_TABLET | Freq: Three times a day (TID) | ORAL | Status: DC | PRN
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
## 2015-09-10 ENCOUNTER — Encounter: Payer: Self-pay | Admitting: Family Medicine

## 2015-09-10 ENCOUNTER — Ambulatory Visit (INDEPENDENT_AMBULATORY_CARE_PROVIDER_SITE_OTHER): Payer: Managed Care, Other (non HMO) | Admitting: Family Medicine

## 2015-09-10 VITALS — BP 126/88 | HR 108 | Temp 98.1°F | Ht 66.2 in | Wt 211.0 lb

## 2015-09-10 DIAGNOSIS — M9903 Segmental and somatic dysfunction of lumbar region: Secondary | ICD-10-CM

## 2015-09-10 DIAGNOSIS — M545 Low back pain, unspecified: Secondary | ICD-10-CM

## 2015-09-10 DIAGNOSIS — M9905 Segmental and somatic dysfunction of pelvic region: Secondary | ICD-10-CM | POA: Diagnosis not present

## 2015-09-10 DIAGNOSIS — M9908 Segmental and somatic dysfunction of rib cage: Secondary | ICD-10-CM

## 2015-09-10 DIAGNOSIS — M9904 Segmental and somatic dysfunction of sacral region: Secondary | ICD-10-CM

## 2015-09-10 DIAGNOSIS — M9902 Segmental and somatic dysfunction of thoracic region: Secondary | ICD-10-CM
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
## 2015-09-29 ENCOUNTER — Telehealth: Payer: Self-pay | Admitting: Family

## 2015-09-29 DIAGNOSIS — R05 Cough: Secondary | ICD-10-CM

## 2015-09-29 DIAGNOSIS — R059 Cough, unspecified: Secondary | ICD-10-CM

## 2015-09-29 MED ORDER — BENZONATATE 100 MG PO CAPS: 100.0000 mg | ORAL_CAPSULE | Freq: Three times a day (TID) | ORAL | Status: DC | PRN

## 2015-09-29 NOTE — Progress Notes (Signed)
We are sorry that you are not feeling well.  Here is how we plan to help!  Based on what you have shared with me it looks like you have upper respiratory tract inflammation that has resulted in a significant cough.  Inflammation and infection in the upper respiratory tract is commonly called bronchitis and has four common causes:  Allergies, Viral Infections, Acid Reflux and Bacterial Infections.  Allergies, viruses and acid reflux are treated by controlling symptoms or eliminating the cause. An example might be a cough caused by taking certain blood pressure medications. You stop the cough by changing the medication. Another example might be a cough caused by acid reflux. Controlling the reflux helps control the cough.  Based on your presentation I believe you most likely have A cough due to a virus.  This is called viral bronchitis and is best treated by rest, plenty of fluids and control of the cough.  You may use Ibuprofen or Tylenol as directed to help your symptoms.    In addition you may use A non-prescription cough medication called Mucinex DM: take 2 tablets every 12 hours. and A prescription cough medication called Tessalon Perles 100mg. You may take 1-2 capsules every 8 hours as needed for your cough.    HOME CARE . Only take medications as instructed by your medical team. . Complete the entire course of an antibiotic. . Drink plenty of fluids and get plenty of rest. . Avoid close contacts especially the very young and the elderly . Cover your mouth if you cough or cough into your sleeve. . Always remember to wash your hands . A steam or ultrasonic humidifier can help congestion.    GET HELP RIGHT AWAY IF: . You develop worsening fever. . You become short of breath . You cough up blood. . Your symptoms persist after you have completed your treatment plan MAKE SURE YOU   Understand these instructions.  Will watch your condition.  Will get help right away if you are not doing  well or get worse.  Your e-visit answers were reviewed by a board certified advanced clinical practitioner to complete your personal care plan.  Depending on the condition, your plan could have included both over the counter or prescription medications. If there is a problem please reply  once you have received a response from your provider. Your safety is important to us.  If you have drug allergies check your prescription carefully.    You can use MyChart to ask questions about today's visit, request a non-urgent call back, or ask for a work or school excuse for 24 hours related to this e-Visit. If it has been greater than 24 hours you will need to follow up with your provider, or enter a new e-Visit to address those concerns. You will get an e-mail in the next two days asking about your experience.  I hope that your e-visit has been valuable and will speed your recovery. Thank you for using e-visits.   

## 2015-10-28 ENCOUNTER — Telehealth: Payer: Self-pay

## 2015-10-28 ENCOUNTER — Ambulatory Visit (INDEPENDENT_AMBULATORY_CARE_PROVIDER_SITE_OTHER): Payer: Managed Care, Other (non HMO) | Admitting: Unknown Physician Specialty

## 2015-10-28 ENCOUNTER — Encounter: Payer: Self-pay | Admitting: Unknown Physician Specialty

## 2015-10-28 VITALS — BP 124/88 | HR 116 | Temp 98.3°F | Ht 66.7 in | Wt 214.2 lb

## 2015-10-28 DIAGNOSIS — R05 Cough: Secondary | ICD-10-CM

## 2015-10-28 DIAGNOSIS — E785 Hyperlipidemia, unspecified: Secondary | ICD-10-CM | POA: Diagnosis not present

## 2015-10-28 DIAGNOSIS — J069 Acute upper respiratory infection, unspecified: Secondary | ICD-10-CM

## 2015-10-28 DIAGNOSIS — K529 Noninfective gastroenteritis and colitis, unspecified: Secondary | ICD-10-CM | POA: Diagnosis not present

## 2015-10-28 DIAGNOSIS — R1031 Right lower quadrant pain: Secondary | ICD-10-CM | POA: Diagnosis not present

## 2015-10-28 DIAGNOSIS — R059 Cough, unspecified: Secondary | ICD-10-CM

## 2015-10-28 LAB — CBC WITH DIFFERENTIAL/PLATELET
HEMATOCRIT: 35.9 % (ref 34.0–46.6)
HEMOGLOBIN: 12.3 g/dL (ref 11.1–15.9)
LYMPHS ABS: 2.8 10*3/uL (ref 0.7–3.1)
Lymphs: 25 %
MCH: 28 pg (ref 26.6–33.0)
MCHC: 34.3 g/dL (ref 31.5–35.7)
MCV: 82 fL (ref 79–97)
MID (Absolute): 0.7 10*3/uL (ref 0.1–1.6)
MID: 7 %
NEUTROS ABS: 7.4 10*3/uL — AB (ref 1.4–7.0)
NEUTROS PCT: 68 %
Platelets: 521 10*3/uL — ABNORMAL HIGH (ref 150–379)
RBC: 4.39 x10E6/uL (ref 3.77–5.28)
RDW: 14.9 % (ref 12.3–15.4)
WBC: 10.9 10*3/uL — AB (ref 3.4–10.8)

## 2015-10-28 LAB — INFLUENZA A AND B
INFLUENZA A AG, EIA: NEGATIVE
INFLUENZA B AG, EIA: NEGATIVE

## 2015-10-28 LAB — PLEASE NOTE:

## 2015-10-28 MED ORDER — PROMETHAZINE HCL 25 MG PO TABS: 25.0000 mg | ORAL_TABLET | Freq: Four times a day (QID) | ORAL | Status: DC | PRN

## 2015-10-28 NOTE — Progress Notes (Signed)
BP 124/88 mmHg  Pulse 116  Temp(Src) 98.3 F (36.8 C)  Ht 5' 6.7" (1.694 m)  Wt 214 lb 3.2 oz (97.16 kg)  BMI 33.86 kg/m2  SpO2 97%  LMP 10/18/2015 (Exact Date)   Subjective:    Patient ID: Ashley Kidd, female    DOB: 08-Sep-1981, 35 y.o.   MRN: [id number]  HPI: Ashley Kidd is a 35 y.o. female  Chief Complaint  Patient presents with  . URI    pt states she has nausea, vomitting, diarrhea, nasla congestion, headache, sinus pressure, and a cough. States symptoms started Thursday.   URI  This is a new problem. Episode onset: started 4 days ago. The problem has been waxing and waning. Associated symptoms include abdominal pain, congestion, coughing, diarrhea, headaches, nausea, rhinorrhea, sinus pain, a sore throat and vomiting. Associated symptoms comments: Aches all over.    Relevant past medical, surgical, family and social history reviewed and updated as indicated. Interim medical history since our last visit reviewed. Allergies and medications reviewed and updated.  Review of Systems  HENT: Positive for congestion, rhinorrhea and sore throat.   Respiratory: Positive for cough.   Gastrointestinal: Positive for nausea, vomiting, abdominal pain and diarrhea.  Neurological: Positive for headaches.     Per HPI unless specifically indicated above     Objective:    BP 124/88 mmHg  Pulse 116  Temp(Src) 98.3 F (36.8 C)  Ht 5' 6.7" (1.694 m)  Wt 214 lb 3.2 oz (97.16 kg)  BMI 33.86 kg/m2  SpO2 97%  LMP 10/18/2015 (Exact Date)  Wt Readings from Last 3 Encounters:  10/28/15 214 lb 3.2 oz (97.16 kg)  09/10/15 211 lb (95.709 kg)  09/03/15 211 lb (95.709 kg)    Physical Exam  Constitutional: She is oriented to person, place, and time. She appears well-developed and well-nourished. No distress.  HENT:  Head: Normocephalic and atraumatic.  Right Ear: Tympanic membrane and ear canal normal.  Left Ear: Tympanic membrane and ear canal normal.  Nose: Rhinorrhea  present. Right sinus exhibits no maxillary sinus tenderness and no frontal sinus tenderness. Left sinus exhibits no maxillary sinus tenderness and no frontal sinus tenderness.  Mouth/Throat: Mucous membranes are normal. Posterior oropharyngeal erythema present.  Eyes: Conjunctivae and lids are normal. Right eye exhibits no discharge. Left eye exhibits no discharge. No scleral icterus.  Cardiovascular: Normal rate and regular rhythm.   Pulmonary/Chest: Effort normal and breath sounds normal. No respiratory distress.  Abdominal: Soft. Normal appearance. There is no splenomegaly or hepatomegaly. There is tenderness in the right lower quadrant. There is no guarding, no tenderness at McBurney's point and negative Murphy's sign.  CBC was normal  Musculoskeletal: Normal range of motion.  Neurological: She is alert and oriented to person, place, and time.  Skin: Skin is intact. No rash noted. No pallor.  Psychiatric: She has a normal mood and affect. Her behavior is normal. Judgment and thought content normal.    Results for orders placed or performed in visit on 04/25/14  [facility]  Result Value Ref Range   WBC 9.5 3.6-11.0 x10 3/mm    RBC 4.60 3.80-5.20 x10 6/mm    HGB 13.3 12.0-16.0 g/dL   HCT 39.5 35.0-47.0 %   MCV 86 80-100 fL   MCH 28.9 26.0-34.0 pg   MCHC 33.6 32.0-36.0 g/dL   RDW 13.3 11.5-14.5 %   Platelet 475 (H) 150-440 x10 3/mm    Neutrophil % 72.3 %   Lymphocyte % 21.3 %  Monocyte % 5.0 %   Eosinophil % 0.9 %   Basophil % 0.5 %   Neutrophil # 6.8 (H) 1.4-6.5 x10 3/mm    Lymphocyte # 2.0 1.0-3.6 x10 3/mm    Monocyte # 0.5 0.2-0.9 x10 3/mm    Eosinophil # 0.1 0.0-0.7 x10 3/mm    Basophil # 0.0 0.0-0.1 x10 3/mm       Assessment & Plan:   Problem List Items Addressed This Visit    None    Visit Diagnoses    Cough    -  Primary    Relevant Orders    Influenza a and b    Right lower quadrant abdominal pain        Relevant Orders    CBC With  Differential/Platelet    Hyperlipidemia        Upper respiratory infection        Gastroenteritis           Rx for Phenergan.  Drink fluids as much as possible.  Liberal use of Albuterol.    Follow up plan: Return if symptoms worsen or fail to improve.

## 2015-10-28 NOTE — Telephone Encounter (Signed)
Patient stated she got her flu shot at the pharmacy so I called them and they told me she got it 07/10/15.

## 2015-12-12 ENCOUNTER — Telehealth: Payer: No Typology Code available for payment source | Admitting: Physician Assistant

## 2015-12-12 DIAGNOSIS — B9689 Other specified bacterial agents as the cause of diseases classified elsewhere: Secondary | ICD-10-CM

## 2015-12-12 DIAGNOSIS — J208 Acute bronchitis due to other specified organisms: Principal | ICD-10-CM

## 2015-12-12 DIAGNOSIS — J00 Acute nasopharyngitis [common cold]: Secondary | ICD-10-CM

## 2015-12-12 MED ORDER — DOXYCYCLINE HYCLATE 100 MG PO CAPS: 100.0000 mg | ORAL_CAPSULE | Freq: Two times a day (BID) | ORAL | Status: DC

## 2015-12-12 NOTE — Progress Notes (Signed)
We are sorry that you are not feeling well.  Here is how we plan to help!  Based on what you have shared with me it looks like you have upper respiratory tract inflammation that has resulted in a significant cough.  Inflammation and infection in the upper respiratory tract is commonly called bronchitis and has four common causes:  Allergies, Viral Infections, Acid Reflux and Bacterial Infections.  Allergies, viruses and acid reflux are treated by controlling symptoms or eliminating the cause. An example might be a cough caused by taking certain blood pressure medications. You stop the cough by changing the medication. Another example might be a cough caused by acid reflux. Controlling the reflux helps control the cough.  Based on your presentation I believe you most likely have A cough due to bacteria.  When patients have a fever and a productive cough with a change in color or increased sputum production, we are concerned about bacterial bronchitis.  If left untreated it can progress to pneumonia.  If your symptoms do not improve with your treatment plan it is important that you contact your provider.   I hve prescribed Doxycycline 100 mg twice a day for 7 days    In addition you may use A prescription cough medication called Tessalon Perles 100mg . You may take 1-2 capsules every 8 hours as needed for your cough.      HOME CARE . Only take medications as instructed by your medical team. . Complete the entire course of an antibiotic. . Drink plenty of fluids and get plenty of rest. . Avoid close contacts especially the very young and the elderly . Cover your mouth if you cough or cough into your sleeve. . Always remember to wash your hands . A steam or ultrasonic humidifier can help congestion.    GET HELP RIGHT AWAY IF: . You develop worsening fever. . You become short of breath . You cough up blood. . Your symptoms persist after you have completed your treatment plan MAKE SURE YOU    Understand these instructions.  Will watch your condition.  Will get help right away if you are not doing well or get worse.  Your e-visit answers were reviewed by a board certified advanced clinical practitioner to complete your personal care plan.  Depending on the condition, your plan could have included both over the counter or prescription medications. If there is a problem please reply  once you have received a response from your provider. Your safety is important to [place].  If you have drug allergies check your prescription carefully.    You can use MyChart to ask questions about today's visit, request a non-urgent call back, or ask for a work or school excuse for 24 hours related to this e-Visit. If it has been greater than 24 hours you will need to follow up with your provider, or enter a new e-Visit to address those concerns. You will get an e-mail in the next two days asking about your experience.  I hope that your e-visit has been valuable and will speed your recovery. Thank you for using e-visits.

## 2015-12-19 ENCOUNTER — Encounter: Payer: Self-pay | Admitting: Family Medicine

## 2015-12-19 ENCOUNTER — Ambulatory Visit (INDEPENDENT_AMBULATORY_CARE_PROVIDER_SITE_OTHER): Payer: Managed Care, Other (non HMO) | Admitting: Family Medicine

## 2015-12-19 VITALS — BP 126/87 | HR 125 | Temp 97.5°F | Ht 66.8 in | Wt 209.0 lb

## 2015-12-19 DIAGNOSIS — R300 Dysuria: Secondary | ICD-10-CM

## 2015-12-19 DIAGNOSIS — N3001 Acute cystitis with hematuria: Secondary | ICD-10-CM

## 2015-12-19 LAB — UA/M W/RFLX CULTURE, ROUTINE
Bilirubin, UA: NEGATIVE
GLUCOSE, UA: NEGATIVE
KETONES UA: NEGATIVE
Leukocytes, UA: NEGATIVE
NITRITE UA: NEGATIVE
PH UA: 6.5 (ref 5.0–7.5)
Protein, UA: NEGATIVE
Specific Gravity, UA: 1.01 (ref 1.005–1.030)
UUROB: 0.2 mg/dL (ref 0.2–1.0)

## 2015-12-19 LAB — MICROSCOPIC EXAMINATION: WBC UA: NONE SEEN /HPF (ref 0–?)

## 2015-12-19 MED ORDER — CIPROFLOXACIN HCL 250 MG PO TABS: 250.0000 mg | ORAL_TABLET | Freq: Two times a day (BID) | ORAL | Status: DC

## 2015-12-19 NOTE — Progress Notes (Signed)
BP 126/87 mmHg  Pulse 125  Temp(Src) 97.5 F (36.4 C)  Ht 5' 6.8" (1.697 m)  Wt 209 lb (94.802 kg)  BMI 32.92 kg/m2  SpO2 99%  LMP 12/12/2015 (Exact Date)   Subjective:    Patient ID: Ashley Kidd, female    DOB: [birth year], 35 y.o.   MRN: [id number]  HPI: Ashley Kidd is a 35 y.o. female  Chief Complaint  Patient presents with  . Urinary Tract Infection    pain and burning, kidney pain   URINARY SYMPTOMS- on doxycycline for bronchitis, taking last dose tonight Duration: 2 days Dysuria: yes Urinary frequency: no, hard to get it out Urgency: no Small volume voids: yes Symptom severity: severe Urinary incontinence: no Foul odor: no Hematuria: no Abdominal pain: no Back pain: yes Suprapubic pain/pressure: no Flank pain: no Fever:  No, but sweats Vomiting: yes Relief with cranberry juice: no Relief with pyridium: no Status: worse Previous urinary tract infection: yes Recurrent urinary tract infection: no Sexual activity: monogomous History of sexually transmitted disease: no Vaginal discharge: no Treatments attempted: antibiotics, pyridium, cranberry and increasing fluids   Relevant past medical, surgical, family and social history reviewed and updated as indicated. Interim medical history since our last visit reviewed. Allergies and medications reviewed and updated.  Review of Systems  Constitutional: Negative.   Respiratory: Negative.   Cardiovascular: Negative.   Gastrointestinal: Positive for abdominal pain. Negative for nausea, vomiting, diarrhea, constipation, blood in stool, abdominal distention, anal bleeding and rectal pain.  Genitourinary: Positive for dysuria and frequency. Negative for urgency, hematuria, flank pain, decreased urine volume, vaginal bleeding, vaginal discharge, enuresis, difficulty urinating, genital sores, vaginal pain, menstrual problem, pelvic pain and dyspareunia.  Psychiatric/Behavioral: Negative.     Per HPI unless  specifically indicated above     Objective:    BP 126/87 mmHg  Pulse 125  Temp(Src) 97.5 F (36.4 C)  Ht 5' 6.8" (1.697 m)  Wt 209 lb (94.802 kg)  BMI 32.92 kg/m2  SpO2 99%  LMP 12/12/2015 (Exact Date)  Wt Readings from Last 3 Encounters:  12/19/15 209 lb (94.802 kg)  10/28/15 214 lb 3.2 oz (97.16 kg)  09/10/15 211 lb (95.709 kg)    Physical Exam  Constitutional: She is oriented to person, place, and time. She appears well-developed and well-nourished. No distress.  HENT:  Head: Normocephalic and atraumatic.  Right Ear: Hearing normal.  Left Ear: Hearing normal.  Nose: Nose normal.  Eyes: Conjunctivae and lids are normal. Right eye exhibits no discharge. Left eye exhibits no discharge. No scleral icterus.  Cardiovascular: Normal rate, regular rhythm, normal heart sounds and intact distal pulses.  Exam reveals no gallop and no friction rub.   No murmur heard. Pulmonary/Chest: Effort normal and breath sounds normal. No respiratory distress. She has no wheezes. She has no rales. She exhibits no tenderness.  Abdominal: Soft. Bowel sounds are normal. She exhibits no distension and no mass. There is tenderness in the suprapubic area. There is CVA tenderness. There is no rebound and no guarding.  Musculoskeletal: Normal range of motion.  Neurological: She is alert and oriented to person, place, and time.  Skin: Skin is warm, dry and intact. No rash noted. No erythema. No pallor.  Psychiatric: She has a normal mood and affect. Her speech is normal and behavior is normal. Judgment and thought content normal. Cognition and memory are normal.  Nursing note and vitals reviewed.   Results for orders placed or performed in visit on 12/19/15  Microscopic  Examination  Result Value Ref Range   WBC, UA None seen 0 -  5 /hpf   RBC, UA 0-2 0 -  2 /hpf   Epithelial Cells (non renal) 0-10 0 - 10 /hpf   Bacteria, UA Few None seen/Few  UA/M w/rflx Culture, Routine  Result Value Ref Range    Specific Gravity, UA 1.010 1.005 - 1.030   pH, UA 6.5 5.0 - 7.5   Color, UA Yellow Yellow   Appearance Ur Clear Clear   Leukocytes, UA Negative Negative   Protein, UA Negative Negative/Trace   Glucose, UA Negative Negative   Ketones, UA Negative Negative   RBC, UA Trace (A) Negative   Bilirubin, UA Negative Negative   Urobilinogen, Ur 0.2 0.2 - 1.0 mg/dL   Nitrite, UA Negative Negative   Microscopic Examination See below:       Assessment & Plan:   Problem List Items Addressed This Visit    None    Visit Diagnoses    Acute cystitis with hematuria    -  Primary    UA negative, but likely partially treated with doxy. Will treat with cipro. She will call if not getting better or getting worse.     Dysuria        UA negtive, but on abx.    Relevant Orders    UA/M w/rflx Culture, Routine (Completed)        Follow up plan: Return if symptoms worsen or fail to improve.

## 2015-12-22 ENCOUNTER — Emergency Department: Payer: Managed Care, Other (non HMO)

## 2015-12-22 ENCOUNTER — Emergency Department
Admission: EM | Admit: 2015-12-22 | Discharge: 2015-12-22 | Disposition: A | Discharge status: Home / Self Care | Payer: Managed Care, Other (non HMO) | Attending: Emergency Medicine | Admitting: Emergency Medicine

## 2015-12-22 ENCOUNTER — Encounter: Payer: Self-pay | Admitting: Emergency Medicine

## 2015-12-22 DIAGNOSIS — R6883 Chills (without fever): Secondary | ICD-10-CM | POA: Insufficient documentation

## 2015-12-22 DIAGNOSIS — Z87891 Personal history of nicotine dependence: Secondary | ICD-10-CM | POA: Insufficient documentation

## 2015-12-22 DIAGNOSIS — R112 Nausea with vomiting, unspecified: Secondary | ICD-10-CM | POA: Diagnosis not present

## 2015-12-22 DIAGNOSIS — Z3202 Encounter for pregnancy test, result negative: Secondary | ICD-10-CM | POA: Insufficient documentation

## 2015-12-22 DIAGNOSIS — R61 Generalized hyperhidrosis: Secondary | ICD-10-CM | POA: Diagnosis not present

## 2015-12-22 DIAGNOSIS — R197 Diarrhea, unspecified: Secondary | ICD-10-CM | POA: Diagnosis not present

## 2015-12-22 DIAGNOSIS — Z7984 Long term (current) use of oral hypoglycemic drugs: Secondary | ICD-10-CM | POA: Insufficient documentation

## 2015-12-22 DIAGNOSIS — Z87440 Personal history of urinary (tract) infections: Secondary | ICD-10-CM | POA: Diagnosis not present

## 2015-12-22 DIAGNOSIS — R109 Unspecified abdominal pain: Secondary | ICD-10-CM

## 2015-12-22 DIAGNOSIS — R10A0 Flank pain, unspecified side: Secondary | ICD-10-CM

## 2015-12-22 DIAGNOSIS — Z7951 Long term (current) use of inhaled steroids: Secondary | ICD-10-CM | POA: Insufficient documentation

## 2015-12-22 DIAGNOSIS — R1030 Lower abdominal pain, unspecified: Secondary | ICD-10-CM | POA: Insufficient documentation

## 2015-12-22 DIAGNOSIS — R1031 Right lower quadrant pain: Secondary | ICD-10-CM | POA: Diagnosis not present

## 2015-12-22 DIAGNOSIS — Z7982 Long term (current) use of aspirin: Secondary | ICD-10-CM | POA: Diagnosis not present

## 2015-12-22 DIAGNOSIS — Z793 Long term (current) use of hormonal contraceptives: Secondary | ICD-10-CM | POA: Diagnosis not present

## 2015-12-22 DIAGNOSIS — Z79899 Other long term (current) drug therapy: Secondary | ICD-10-CM | POA: Insufficient documentation

## 2015-12-22 DIAGNOSIS — R5383 Other fatigue: Secondary | ICD-10-CM | POA: Diagnosis not present

## 2015-12-22 DIAGNOSIS — R000 Tachycardia, unspecified: Secondary | ICD-10-CM | POA: Insufficient documentation

## 2015-12-22 LAB — BASIC METABOLIC PANEL
Anion gap: 10 (ref 5–15)
BUN: 8 mg/dL (ref 6–20)
CALCIUM: 8.6 mg/dL — AB (ref 8.9–10.3)
CO2: 19 mmol/L — AB (ref 22–32)
CREATININE: 0.73 mg/dL (ref 0.44–1.00)
Chloride: 104 mmol/L (ref 101–111)
GFR calc non Af Amer: >60 mL/min (ref >60)
GLUCOSE: 114 mg/dL — AB (ref 65–99)
Potassium: 3.3 mmol/L — ABNORMAL LOW (ref 3.5–5.1)
Sodium: 133 mmol/L — ABNORMAL LOW (ref 135–145)

## 2015-12-22 LAB — CBC
HCT: 38 % (ref 35.0–47.0)
Hemoglobin: 12.8 g/dL (ref 12.0–16.0)
MCH: 26.7 pg (ref 26.0–34.0)
MCHC: 33.6 g/dL (ref 32.0–36.0)
MCV: 79.4 fL — ABNORMAL LOW (ref 80.0–100.0)
PLATELETS: 449 10*3/uL — AB (ref 150–440)
RBC: 4.79 MIL/uL (ref 3.80–5.20)
RDW: 15.3 % — ABNORMAL HIGH (ref 11.5–14.5)
WBC: 8.4 10*3/uL (ref 3.6–11.0)

## 2015-12-22 LAB — URINALYSIS COMPLETE WITH MICROSCOPIC (ARMC ONLY)
BILIRUBIN URINE: NEGATIVE
Glucose, UA: NEGATIVE mg/dL
Hgb urine dipstick: NEGATIVE
KETONES UR: NEGATIVE mg/dL
Leukocytes, UA: NEGATIVE
Nitrite: NEGATIVE
Protein, ur: 30 mg/dL — AB
Specific Gravity, Urine: 1.031 — ABNORMAL HIGH (ref 1.005–1.030)
pH: 5 (ref 5.0–8.0)

## 2015-12-22 LAB — POCT PREGNANCY, URINE: Preg Test, Ur: NEGATIVE

## 2015-12-22 MED ORDER — ONDANSETRON HCL 4 MG/2ML IJ SOLN
4.0000 mg | Freq: Once | INTRAMUSCULAR | Status: AC
  Administered 2015-12-22: 4 mg via INTRAVENOUS
  Filled 2015-12-22: qty 2

## 2015-12-22 MED ORDER — METRONIDAZOLE 500 MG PO TABS: 500.0000 mg | ORAL_TABLET | Freq: Three times a day (TID) | ORAL | Status: AC

## 2015-12-22 MED ORDER — SODIUM CHLORIDE 0.9 % IV BOLUS (SEPSIS)
1000.0000 mL | Freq: Once | INTRAVENOUS | Status: AC
  Administered 2015-12-22: 1000 mL via INTRAVENOUS

## 2015-12-22 MED ORDER — OXYCODONE-ACETAMINOPHEN 5-325 MG PO TABS
1.0000 | ORAL_TABLET | ORAL | Status: DC | PRN
  Administered 2015-12-22: 1 via ORAL
  Filled 2015-12-22: qty 1

## 2015-12-22 MED ORDER — ONDANSETRON 4 MG PO TBDP: 4.0000 mg | ORAL_TABLET | Freq: Three times a day (TID) | ORAL | Status: DC | PRN

## 2015-12-22 NOTE — ED Notes (Signed)
Pt c/o vomiting last Wednesday.  Pt currently being treated for UTI.  Thinks she has kidney stone.  Denies hematuria.  Has right flank pain that radiates to right abdomen.

## 2015-12-22 NOTE — Discharge Instructions (Signed)
FAQs What is Clostridium difficile infection?  Clostridium difficile [pronounced Klo-STRID-ee-um dif-uh-SEEL], also known as "C. diff" [See-dif], is a germ that can cause diarrhea. Most cases of C. diff infection occur in patients taking antibiotics. The most common symptoms of a C. diff infection include:  Watery diarrhea  Fever  Loss of appetite  Nausea  Belly pain and tenderness Who is most likely to get C. diff infection? The elderly and people with certain medical problems have the greatest chance of getting C. diff. C. diff spores can live outside the human body for a very long time and may be found on things in the environment such as bed linens, bed rails, bathroom fixtures, and medical equipment. C. diff infection can spread from person-to-person on contaminated equipment and on the hands of doctors, nurses, other healthcare providers and visitors. Can C. diff infection be treated? Yes, there are antibiotics that can be used to treat C. diff. In some severe cases, a person might have to have surgery to remove the infected part of the intestines. This surgery is needed in only 1 or 2 out of every 100 persons with C. diff. What are some of the things that hospitals are doing to prevent C. diff infections? To prevent C. diff infections, doctors, nurses, and other healthcare providers:  Clean their hands with soap and water or an alcohol-based hand rub before and after caring for every patient. This can prevent C. diff and other germs from being passed from one patient to another on their hands.  Carefully clean hospital rooms and medical equipment that have been used for patients with C. diff.  Use Contact Precautions to prevent C. diff from spreading to other patients. Contact Precautions mean:  Whenever possible, patients with C. diff will have a single room or share a room only with someone else who also has C. diff.  Healthcare providers will put on gloves and wear a gown over  their clothing while taking care of patients with C. diff.  Visitors may also be asked to wear a gown and gloves.  When leaving the room, hospital providers and visitors remove their gown and gloves and clean their hands.  Patients on Contact Precautions are asked to stay in their hospital rooms as much as possible. They should not go to common areas, such as the gift shop or cafeteria. They can go to other areas of the hospital for treatments and tests.  Only give patients antibiotics when it is necessary. What can I do to help prevent C. diff infections?  Make sure that all doctors, nurses, and other healthcare providers clean their hands with soap and water or an alcohol-based hand rub before and after caring for you.  If you do not see your providers clean their hands, please ask them to do so.  Only take antibiotics as prescribed by your doctor.  Be sure to clean your own hands often, especially after using the bathroom and before eating. Can my friends and family get C. diff when they visit me? C. diff infection usually does not occur in persons who are not taking antibiotics. Visitors are not likely to get C. diff. Still, to make it safer for visitors, they should:  Clean their hands before they enter your room and as they leave your room  Ask the nurse if they need to wear protective gowns and gloves when they visit you. What do I need to do when I go home from the hospital? Once you are back  at home, you can return to your normal routine. Often, the diarrhea will be better or completely gone before you go home. This makes giving C. diff to other people much less likely. There are a few things you should do, however, to lower the chances of developing C. diff infection again or of spreading it to others.  If you are given a prescription to treat C. diff, take the medicine exactly as prescribed by your doctor and pharmacist. Do not take half-doses or stop before you run out.  Wash  your hands often, especially after going to the bathroom and before preparing food.  People who live with you should wash their hands often as well.  If you develop more diarrhea after you get home, tell your doctor immediately.  Your doctor may give you additional instructions. If you have questions, please ask your doctor or nurse. Developed and co-sponsored by [employer] for [place] [phone]); Infectious Diseases Society of [place] (IDSA); [place]; Association for Professionals in Infection Control and Epidemiology (APIC); Centers for Disease Control and Prevention (CDC); and [employer].   This information is not intended to replace advice given to you by your health care provider. Make sure you discuss any questions you have with your health care provider.   Document Released: 09/19/2013 Document Revised: 01/29/2015 Document Reviewed: 11/28/2014 Elsevier Interactive Patient Education 2016 [place].  Diarrhea Diarrhea is watery poop (stool). It can make you feel weak, tired, thirsty, or give you a dry mouth (signs of dehydration). Watery poop is a sign of another problem, most often an infection. It often lasts 2-3 days. It can last longer if it is a sign of something serious. Take care of yourself as told by your doctor. HOME CARE   Drink 1 cup (8 ounces) of fluid each time you have watery poop.  Do not drink the following fluids:  Those that contain simple sugars (fructose, glucose, galactose, lactose, sucrose, maltose).  Sports drinks.  Fruit juices.  Whole milk products.  Sodas.  Drinks with caffeine (coffee, tea, soda) or alcohol.  Oral rehydration solution may be used if the doctor says it is okay. You may make your own solution. Follow this recipe:   - teaspoon table salt.   teaspoon baking soda.   teaspoon salt substitute containing potassium chloride.  1 tablespoons sugar.  1 liter (34 ounces) of  water.  Avoid the following foods:  High fiber foods, such as raw fruits and vegetables.  Nuts, seeds, and whole grain breads and cereals.   Those that are sweetened with sugar alcohols (xylitol, sorbitol, mannitol).  Try eating the following foods:  Starchy foods, such as rice, toast, pasta, low-sugar cereal, oatmeal, baked potatoes, crackers, and bagels.  Bananas.  Applesauce.  Eat probiotic-rich foods, such as yogurt and milk products that are fermented.  Wash your hands well after each time you have watery poop.  Only take medicine as told by your doctor.  Take a warm bath to help lessen burning or pain from having watery poop. GET HELP RIGHT AWAY IF:   You cannot drink fluids without throwing up (vomiting).  You keep throwing up.  You have blood in your poop, or your poop looks black and tarry.  You do not pee (urinate) in 6-8 hours, or there is only a small amount of very dark pee.  You have belly (abdominal) pain that gets worse or stays in the same spot (localizes).  You are weak, dizzy,  confused, or light-headed.  You have a very bad headache.  Your watery poop gets worse or does not get better.  You have a fever or lasting symptoms for more than 2-3 days.  You have a fever and your symptoms suddenly get worse. MAKE SURE YOU:   Understand these instructions.  Will watch your condition.  Will get help right away if you are not doing well or get worse.   This information is not intended to replace advice given to you by your health care provider. Make sure you discuss any questions you have with your health care provider.   Document Released: 03/02/2008 Document Revised: 10/05/2014 Document Reviewed: 05/22/2012 Elsevier Interactive Patient Education 2016 Elsevier Inc.  Flank Pain Flank pain is pain in your side. The flank is the area of your side between your upper belly (abdomen) and your back. Pain in this area can be caused by many different  things. Ranger care and treatment will depend on the cause of your pain.  Rest as told by your doctor.  Drink enough fluids to keep your pee (urine) clear or pale yellow.  Only take medicine as told by your doctor.  Tell your doctor about any changes in your pain.  Follow up with your doctor. GET HELP RIGHT AWAY IF:   Your pain does not get better with medicine.   You have new symptoms or your symptoms get worse.  Your pain gets worse.   You have belly (abdominal) pain.   You are short of breath.   You always feel sick to your stomach (nauseous).   You keep throwing up (vomiting).   You have puffiness (swelling) in your belly.   You feel light-headed or you pass out (faint).   You have blood in your pee.  You have a fever or lasting symptoms for more than 2-3 days.  You have a fever and your symptoms suddenly get worse. MAKE SURE YOU:   Understand these instructions.  Will watch your condition.  Will get help right away if you are not doing well or get worse.   This information is not intended to replace advice given to you by your health care provider. Make sure you discuss any questions you have with your health care provider.   Document Released: 06/23/2008 Document Revised: 10/05/2014 Document Reviewed: 04/28/2012 Elsevier Interactive Patient Education 2016 [place] Choices to Help Relieve Diarrhea, Adult When you have diarrhea, the foods you eat and your eating habits are very important. Choosing the right foods and drinks can help relieve diarrhea. Also, because diarrhea can last up to 7 days, you need to replace lost fluids and electrolytes (such as sodium, potassium, and chloride) in order to help prevent dehydration.  WHAT GENERAL GUIDELINES DO I NEED TO FOLLOW?  Slowly drink 1 cup (8 oz) of fluid for each episode of diarrhea. If you are getting enough fluid, your urine will be clear or pale yellow.  Eat starchy foods.  Some good choices include white rice, white toast, pasta, low-fiber cereal, baked potatoes (without the skin), saltine crackers, and bagels.  Avoid large servings of any cooked vegetables.  Limit fruit to two servings per day. A serving is  cup or 1 small piece.  Choose foods with less than 2 g of fiber per serving.  Limit fats to less than 8 tsp (38 g) per day.  Avoid fried foods.  Eat foods that have probiotics in them. Probiotics can be found in certain  dairy products.  Avoid foods and beverages that may increase the speed at which food moves through the stomach and intestines (gastrointestinal tract). Things to avoid include:  High-fiber foods, such as dried fruit, raw fruits and vegetables, nuts, seeds, and whole grain foods.  Spicy foods and high-fat foods.  Foods and beverages sweetened with high-fructose corn syrup, honey, or sugar alcohols such as xylitol, sorbitol, and mannitol. WHAT FOODS ARE RECOMMENDED? Grains White rice. White, [place], or pita breads (fresh or toasted), including plain rolls, buns, or bagels. White pasta. Saltine, soda, or graham crackers. Pretzels. Low-fiber cereal. Cooked cereals made with water (such as cornmeal, farina, or cream cereals). Plain muffins. Matzo. Melba toast. Zwieback.  Vegetables Potatoes (without the skin). Strained tomato and vegetable juices. Most well-cooked and canned vegetables without seeds. Tender lettuce. Fruits Cooked or canned applesauce, apricots, cherries, fruit cocktail, grapefruit, peaches, pears, or plums. Fresh bananas, apples without skin, cherries, grapes, cantaloupe, grapefruit, peaches, oranges, or plums.  Meat and Other Protein Products Baked or boiled chicken. Eggs. Tofu. Fish. Seafood. Smooth peanut butter. Ground or well-cooked tender beef, ham, veal, lamb, pork, or poultry.  Dairy Plain yogurt, kefir, and unsweetened liquid yogurt. Lactose-free milk, buttermilk, or soy milk. Plain hard  cheese. Beverages Sport drinks. Clear broths. Diluted fruit juices (except prune). Regular, caffeine-free sodas such as ginger ale. Water. Decaffeinated teas. Oral rehydration solutions. Sugar-free beverages not sweetened with sugar alcohols. Other Bouillon, broth, or soups made from recommended foods.  The items listed above may not be a complete list of recommended foods or beverages. Contact your dietitian for more options. WHAT FOODS ARE NOT RECOMMENDED? Grains Whole grain, whole wheat, bran, or rye breads, rolls, pastas, crackers, and cereals. Wild or brown rice. Cereals that contain more than 2 g of fiber per serving. Corn tortillas or taco shells. Cooked or dry oatmeal. Granola. Popcorn. Vegetables Raw vegetables. Cabbage, broccoli, Brussels sprouts, artichokes, baked beans, beet greens, corn, kale, legumes, peas, sweet potatoes, and yams. Potato skins. Cooked spinach and cabbage. Fruits Dried fruit, including raisins and dates. Raw fruits. Stewed or dried prunes. Fresh apples with skin, apricots, mangoes, pears, raspberries, and strawberries.  Meat and Other Protein Products Chunky peanut butter. Nuts and seeds. Beans and lentils. Berniece Salines.  Dairy High-fat cheeses. Milk, chocolate milk, and beverages made with milk, such as milk shakes. Cream. Ice cream. Sweets and Desserts Sweet rolls, doughnuts, and sweet breads. Pancakes and waffles. Fats and Oils Butter. Cream sauces. Margarine. Salad oils. Plain salad dressings. Olives. Avocados.  Beverages Caffeinated beverages (such as coffee, tea, soda, or energy drinks). Alcoholic beverages. Fruit juices with pulp. Prune juice. Soft drinks sweetened with high-fructose corn syrup or sugar alcohols. Other Coconut. Hot sauce. Chili powder. Mayonnaise. Gravy. Cream-based or milk-based soups.  The items listed above may not be a complete list of foods and beverages to avoid. Contact your dietitian for more information. WHAT SHOULD I DO IF I BECOME  DEHYDRATED? Diarrhea can sometimes lead to dehydration. Signs of dehydration include dark urine and dry mouth and skin. If you think you are dehydrated, you should rehydrate with an oral rehydration solution. These solutions can be purchased at pharmacies, retail stores, or online.  Drink -1 cup (120-240 mL) of oral rehydration solution each time you have an episode of diarrhea. If drinking this amount makes your diarrhea worse, try drinking smaller amounts more often. For example, drink 1-3 tsp (5-15 mL) every 5-10 minutes.  A general rule for staying hydrated is to drink 1-2 L of fluid  per day. Talk to your health care provider about the specific amount you should be drinking each day. Drink enough fluids to keep your urine clear or pale yellow.   This information is not intended to replace advice given to you by your health care provider. Make sure you discuss any questions you have with your health care provider.   Document Released: 12/05/2003 Document Revised: 10/05/2014 Document Reviewed: 08/07/2013 Elsevier Interactive Patient Education [employer].

## 2015-12-22 NOTE — ED Provider Notes (Signed)
[facility] Emergency Department Provider Note  ____________________________________________  Time seen: Approximately 2:57 PM  I have reviewed the triage vital signs and the nursing notes.   HISTORY  Chief Complaint Flank Pain and Abdominal Pain    HPI Ashley Kidd is a 35 y.o. female , NAD, presents emergency Department with right flank pain that radiates to the right abdomen over the last 24 hours. Has also noted multiple episodes of foul-smelling watery diarrhea over the last 36 hours. Has had nausea and vomiting since Wednesday. Is currently on ciprofloxacin for treatment of urinary tract infection that was diagnosed by her primary care physician at [facility] family practice. Has taken this medicine in the past without side effects.  Was unable to take her dose of Cipro last night due to vomiting. Has not had any fevers but has had chills and sweats. No personal or family history of nephrolithiasis.   Past Medical History  Diagnosis Date  . GERD (gastroesophageal reflux disease)   . PCOS (polycystic ovarian syndrome)   . Sinus complaint   . Migraine   . Allergy   . Asthma     Patient Active Problem List   Diagnosis Date Noted  . Migraine   . Allergy   . Asthma   . Low back pain 03/26/2015  . Chronic cholecystitis without calculus 05/19/2013    Past Surgical History  Procedure Laterality Date  . Ovarian cyst removal Bilateral 2002  . Upper gi endoscopy  11-29-12    Dr Candace Cruise  . Cholecystectomy  06-09-13    Current Outpatient Rx  Name  Route  Sig  Dispense  Refill  . acetaminophen (TYLENOL) 500 MG tablet   Oral   Take 500 mg by mouth every 6 (six) hours as needed for pain.         Marland Kitchen albuterol (PROVENTIL HFA;VENTOLIN HFA) 108 (90 BASE) MCG/ACT inhaler   Inhalation   Inhale 2 puffs into the lungs every 6 (six) hours as needed for wheezing.         Marland Kitchen aspirin 81 MG tablet   Oral   Take 81 mg by mouth daily.         .  budesonide-formoterol (SYMBICORT) 160-4.5 MCG/ACT inhaler   Inhalation   Inhale 2 puffs into the lungs 2 (two) times daily.         . cetirizine (ZYRTEC) 10 MG tablet   Oral   Take 10 mg by mouth daily.         . citalopram (CELEXA) 20 MG tablet   Oral   Take 20 mg by mouth daily.         . diphenhydrAMINE (BENADRYL) 25 mg capsule   Oral   Take 25 mg by mouth every 6 (six) hours as needed for itching.         Marland Kitchen doxycycline (VIBRAMYCIN) 100 MG capsule   Oral   Take 1 capsule (100 mg total) by mouth 2 (two) times daily.   14 capsule   0   . fluticasone (FLONASE) 50 MCG/ACT nasal spray      SPRAY TWICE IN EACH NOSTRIL EVERY DAY   16 g   12   . ibuprofen (ADVIL,MOTRIN) 800 MG tablet   Oral   Take 1 tablet (800 mg total) by mouth every 8 (eight) hours as needed.   30 tablet   0   . levonorgestrel-ethinyl estradiol (NORDETTE) 0.15-30 MG-MCG tablet   Oral   Take 1 tablet by mouth daily.         Marland Kitchen  metFORMIN (GLUCOPHAGE) 1000 MG tablet   Oral   Take 1,000 mg by mouth 2 (two) times daily with a meal.         . metroNIDAZOLE (FLAGYL) 500 MG tablet   Oral   Take 1 tablet (500 mg total) by mouth 3 (three) times daily.   21 tablet   0   . omeprazole (PRILOSEC) 20 MG capsule   Oral   Take 20 mg by mouth daily.         . ondansetron (ZOFRAN ODT) 4 MG disintegrating tablet   Oral   Take 1 tablet (4 mg total) by mouth every 8 (eight) hours as needed for nausea or vomiting.   20 tablet   0   . phentermine (ADIPEX-P) 37.5 MG tablet      37.5 mg daily.      0   . promethazine (PHENERGAN) 25 MG tablet   Oral   Take 1 tablet (25 mg total) by mouth every 6 (six) hours as needed for nausea or vomiting.   30 tablet   0     Allergies Review of patient's allergies indicates no known allergies.  Family History  Problem Relation Age of Onset  . Diabetes Mother   . Asthma Mother   . Diabetes Father   . Heart attack Father   . Heart disease Father   .  Asthma Sister   . Bipolar disorder Sister   . Asthma Brother   . Bipolar disorder Brother   . Epilepsy Brother     Social History Social History  Substance Use Topics  . Smoking status: Former Smoker -- 12 years    Quit date: 08/24/2012  . Smokeless tobacco: Never Used  . Alcohol Use: No     Review of Systems  Constitutional: Positive chills, sweats, fatigue. No fevers.  Eyes: No visual changes.  ENT: No sore throat. Cardiovascular: No chest pain, palpitations, heart racing. Respiratory: No cough. No shortness of breath. No wheezing.  Gastrointestinal: Positive right lower abdominal pain. Positive nausea, vomiting. Positive watery diarrhea.  Genitourinary: Negative for dysuria, hematuria. No urinary hesitancy, urgency or increased frequency. Musculoskeletal: Positive for right flank pain. Negative for back pain.  Skin: Negative for rash, skin sores. Neurological: Negative for headaches, focal weakness or numbness. No tingling. 10-point ROS otherwise negative.  ____________________________________________   PHYSICAL EXAM:  VITAL SIGNS: ED Triage Vitals  Enc Vitals Group     BP 12/22/15 1429 123/82 mmHg     Pulse Rate 12/22/15 1429 120     Resp 12/22/15 1429 22     Temp 12/22/15 1429 98.6 F (37 C)     Temp Source 12/22/15 1429 Oral     SpO2 12/22/15 1429 97 %     Weight 12/22/15 1429 204 lb (92.534 kg)     Height 12/22/15 1429 5\' 7"  (1.702 m)     Head Cir --      Peak Flow --      Pain Score 12/22/15 1430 5     Pain Loc --      Pain Edu? --      Excl. in [place]? --     Constitutional: Alert and oriented. Ill appearing but in no acute distress. Eyes: Conjunctivae are normal. PERRL.  Head: Atraumatic. ENT:        Mouth/Throat: Mucous membranes are moist.  Neck: Supple with full range of motion. Hematological/Lymphatic/Immunilogical: No cervical lymphadenopathy. Cardiovascular: Tachycardic rate, regular rhythm. Normal S1 and S2 without murmurs, rubs, gallops.  Good peripheral circulation. Respiratory: Normal respiratory effort without tachypnea or retractions. Lungs CTAB with breath sounds noted throughout all lung fields. Gastrointestinal: Tenderness to deep palpation about the right flank and right lower quadrant. Some suprapubic tenderness to deep palpation. Soft and nontender in all other quadrants. No distention, guarding in all quadrants. No CVA tenderness. Musculoskeletal: No lower extremity tenderness nor edema.   Neurologic:  Normal speech and language. No gross focal neurologic deficits are appreciated.  Skin:  Skin is warm, dry and intact. No rash noted. Psychiatric: Mood and affect are normal. Speech and behavior are normal. Patient exhibits appropriate insight and judgement.   ____________________________________________   LABS (all labs ordered are listed, but only abnormal results are displayed)  Labs Reviewed  BASIC METABOLIC PANEL - Abnormal; Notable for the following:    Sodium 133 (*)    Potassium 3.3 (*)    CO2 19 (*)    Glucose, Bld 114 (*)    Calcium 8.6 (*)    All other components within normal limits  CBC - Abnormal; Notable for the following:    MCV 79.4 (*)    RDW 15.3 (*)    Platelets 449 (*)    All other components within normal limits  URINALYSIS COMPLETEWITH MICROSCOPIC ([facility] ONLY) - Abnormal; Notable for the following:    Color, Urine AMBER (*)    APPearance HAZY (*)    Specific Gravity, Urine 1.031 (*)    Protein, ur 30 (*)    Bacteria, UA RARE (*)    Squamous Epithelial / LPF 0-5 (*)    All other components within normal limits  URINE CULTURE  POC URINE PREG, ED  POCT PREGNANCY, URINE   ____________________________________________  EKG  None ____________________________________________  RADIOLOGY I have personally viewed and evaluated these images (plain radiographs) as part of my medical decision making, as well as reviewing the written report by the radiologist.  Ct Renal Stone  Study  12/22/2015  CLINICAL DATA:  Vomiting, right flank pain, UTI, possible kidney stone EXAM: CT ABDOMEN AND PELVIS WITHOUT CONTRAST TECHNIQUE: Multidetector CT imaging of the abdomen and pelvis was performed following the standard protocol without IV contrast. COMPARISON:  CT abdomen pelvis dated 11/07/2012 FINDINGS: Lower chest: 4 mm left lower lobe pulmonary nodule (series 4/ image 5), unchanged since 2014, benign. Hepatobiliary: Severe hepatic steatosis. Status post cholecystectomy. No intrahepatic or extrahepatic ductal dilatation. Pancreas: Within normal limits. Spleen: Within normal limits. Adrenals/Urinary Tract: Adrenal glands are within normal limits. Kidneys are within normal limits. No renal, ureteral, or bladder calculi.  No hydronephrosis. Bladder is within normal limits. Stomach/Bowel: Stomach is within normal limits. No evidence of bowel obstruction. Normal appendix (series 2/image 72). Vascular/Lymphatic: No evidence of abdominal aortic aneurysm. Retro aortic left renal vein. No suspicious abdominopelvic lymphadenopathy. Reproductive: Uterus is within normal limits. Left ovary is within normal limits. 2.7 cm simple right ovarian cyst/follicle (series 2/ image 34), physiologic Other: No abdominopelvic ascites. Musculoskeletal: Visualized osseous structures are within normal limits. IMPRESSION: No renal, ureteral, or bladder calculi.  No hydronephrosis. No evidence of bowel obstruction.  Normal appendix. Severe hepatic steatosis. Electronically Signed   By: Julian Hy M.D.   On: 12/22/2015 15:49    ____________________________________________    PROCEDURES  Procedure(s) performed: None    Medications  oxyCODONE-acetaminophen (PERCOCET/ROXICET) 5-325 MG per tablet 1 tablet (1 tablet Oral Given 12/22/15 1437)  sodium chloride 0.9 % bolus 1,000 mL (1,000 mLs Intravenous New Bag/Given 12/22/15 1546)  ondansetron (ZOFRAN) injection 4 mg (4 mg  Intravenous Given 12/22/15 1546)    ----------------------------------------- 4:17 PM on 12/22/2015 -----------------------------------------  Patient notes she is feeling somewhat better since being given pain medication and IV fluids. Pain is decreased. Denies any nausea or vomiting at this time. Has had some diarrhea since being in the emergency department. CT results were discussed with the patient.   ----------------------------------------- 5:14 PM on 12/22/2015 -----------------------------------------  Patient is now requesting food and drink and notes she feels much better. We are still waiting for a stool sample to test for C. Difficile.   ----------------------------------------- 5:36 PM on 12/22/2015 -----------------------------------------  Patient notes that she is currently feeling much better. She has been unable to have any bowel movement while in the emergency department. She states that normally Zofran causes her stomach. She has been able to eat potato chips without any nausea or vomiting. Vital signs have improved significantly with administration of IV fluids.  ____________________________________________   INITIAL IMPRESSION / ASSESSMENT AND PLAN / ED COURSE  Pertinent labs & imaging results that were available during my care of the patient were reviewed by me and considered in my medical decision making (see chart for details). Unfortunately patient was unable to produce stool sample to send for testing. I will be discharging the patient home on oral Flagyl to cover for possible Clostridium difficile infection following ciprofloxacin antibiotic. Patient will discontinue use of ciprofloxacin immediately. We will not start any additional antibiotics to cover for UTI at this time and will await urine culture results to start a new treatment. Patient is advised to follow-up with her primary care provider tomorrow for recheck to ensure improvement and stability of her symptoms.  Patient's diagnosis is  consistent with nausea with vomiting and infectious diarrhea. Patient will be discharged home with prescriptions for Flagyl, Zofran to take as directed.  Patient is given strict ED precautions to return to the ED for any worsening or new symptoms and verbalizes understanding of such.   ____________________________________________  FINAL CLINICAL IMPRESSION(S) / ED DIAGNOSES  Final diagnoses:  Diarrhea of presumed infectious origin  Non-intractable vomiting with nausea, unspecified vomiting type  Acute flank pain  History of UTI      NEW MEDICATIONS STARTED DURING THIS VISIT:  New Prescriptions   METRONIDAZOLE (FLAGYL) 500 MG TABLET    Take 1 tablet (500 mg total) by mouth 3 (three) times daily.   ONDANSETRON (ZOFRAN ODT) 4 MG DISINTEGRATING TABLET    Take 1 tablet (4 mg total) by mouth every 8 (eight) hours as needed for nausea or vomiting.         Braxton Feathers, PA-C 12/22/15 1745  Eula Listen, MD 12/24/15 1552

## 2015-12-23 ENCOUNTER — Ambulatory Visit (INDEPENDENT_AMBULATORY_CARE_PROVIDER_SITE_OTHER): Payer: Managed Care, Other (non HMO) | Admitting: Family Medicine

## 2015-12-23 ENCOUNTER — Encounter: Payer: Self-pay | Admitting: Family Medicine

## 2015-12-23 VITALS — BP 120/80 | HR 117 | Temp 97.8°F | Ht 66.5 in | Wt 210.0 lb

## 2015-12-23 DIAGNOSIS — A047 Enterocolitis due to Clostridium difficile: Secondary | ICD-10-CM

## 2015-12-23 DIAGNOSIS — A0472 Enterocolitis due to Clostridium difficile, not specified as recurrent: Secondary | ICD-10-CM

## 2015-12-23 NOTE — Progress Notes (Signed)
BP 120/80 mmHg  Pulse 117  Temp(Src) 97.8 F (36.6 C)  Ht 5' 6.5" (1.689 m)  Wt 210 lb (95.255 kg)  BMI 33.39 kg/m2  SpO2 98%  LMP 12/12/2015 (Exact Date)   Subjective:    Patient ID: Ashley Kidd, female    DOB: [birth year], 34 y.o.   MRN: [id number]  HPI: Ashley Kidd is a 35 y.o. female  Chief Complaint  Patient presents with  . C diff per ED     Relevant past medical, surgical, family and social history reviewed and updated as indicated. Interim medical history since our last visit reviewed. Allergies and medications reviewed and updated.  Review of Systems  Per HPI unless specifically indicated above     Objective:    BP 120/80 mmHg  Pulse 117  Temp(Src) 97.8 F (36.6 C)  Ht 5' 6.5" (1.689 m)  Wt 210 lb (95.255 kg)  BMI 33.39 kg/m2  SpO2 98%  LMP 12/12/2015 (Exact Date)  Wt Readings from Last 3 Encounters:  12/23/15 210 lb (95.255 kg)  12/22/15 204 lb (92.534 kg)  12/19/15 209 lb (94.802 kg)    Physical Exam  Results for orders placed or performed during the hospital encounter of 12/22/15  Urine culture  Result Value Ref Range   Specimen Description URINE, RANDOM    Special Requests ciprofloxacin Normal    Culture NO GROWTH < 24 HOURS    Report Status PENDING   Basic metabolic panel  Result Value Ref Range   Sodium 133 (L) 135 - 145 mmol/L   Potassium 3.3 (L) 3.5 - 5.1 mmol/L   Chloride 104 101 - 111 mmol/L   CO2 19 (L) 22 - 32 mmol/L   Glucose, Bld 114 (H) 65 - 99 mg/dL   BUN 8 6 - 20 mg/dL   Creatinine, Ser 0.73 0.44 - 1.00 mg/dL   Calcium 8.6 (L) 8.9 - 10.3 mg/dL   GFR calc non Af Amer >60 >60 mL/min   GFR calc Af Amer >60 >60 mL/min   Anion gap 10 5 - 15  CBC  Result Value Ref Range   WBC 8.4 3.6 - 11.0 K/uL   RBC 4.79 3.80 - 5.20 MIL/uL   Hemoglobin 12.8 12.0 - 16.0 g/dL   HCT 38.0 35.0 - 47.0 %   MCV 79.4 (L) 80.0 - 100.0 fL   MCH 26.7 26.0 - 34.0 pg   MCHC 33.6 32.0 - 36.0 g/dL   RDW 15.3 (H) 11.5 - 14.5 %   Platelets  449 (H) 150 - 440 K/uL  Urinalysis complete, with microscopic- may I&O cath if menses ([facility] only)  Result Value Ref Range   Color, Urine AMBER (A) YELLOW   APPearance HAZY (A) CLEAR   Glucose, UA NEGATIVE NEGATIVE mg/dL   Bilirubin Urine NEGATIVE NEGATIVE   Ketones, ur NEGATIVE NEGATIVE mg/dL   Specific Gravity, Urine 1.031 (H) 1.005 - 1.030   Hgb urine dipstick NEGATIVE NEGATIVE   pH 5.0 5.0 - 8.0   Protein, ur 30 (A) NEGATIVE mg/dL   Nitrite NEGATIVE NEGATIVE   Leukocytes, UA NEGATIVE NEGATIVE   RBC / HPF 0-5 0 - 5 RBC/hpf   WBC, UA 0-5 0 - 5 WBC/hpf   Bacteria, UA RARE (A) NONE SEEN   Squamous Epithelial / LPF 0-5 (A) NONE SEEN   Mucous PRESENT   Pregnancy, urine POC  Result Value Ref Range   Preg Test, Ur NEGATIVE NEGATIVE      Assessment & Plan:  Problem List Items Addressed This Visit    None       Follow up plan: No Follow-up on file.

## 2015-12-24 DIAGNOSIS — A0472 Enterocolitis due to Clostridium difficile, not specified as recurrent: Secondary | ICD-10-CM

## 2015-12-24 HISTORY — DX: Enterocolitis due to Clostridium difficile, not specified as recurrent: A04.72

## 2015-12-24 LAB — URINE CULTURE
Culture: NO GROWTH
Special Requests: NORMAL

## 2015-12-24 NOTE — Assessment & Plan Note (Signed)
Discussed care treatment prevention prevention of contagion discussed dietary restrictions use of antibiotics Recheck if problems worse

## 2016-04-01 ENCOUNTER — Encounter: Payer: Self-pay | Admitting: Family Medicine

## 2016-04-01 ENCOUNTER — Ambulatory Visit (INDEPENDENT_AMBULATORY_CARE_PROVIDER_SITE_OTHER): Payer: Managed Care, Other (non HMO) | Admitting: Family Medicine

## 2016-04-01 VITALS — BP 118/81 | HR 91 | Temp 97.9°F | Wt 203.0 lb

## 2016-04-01 DIAGNOSIS — M79671 Pain in right foot: Secondary | ICD-10-CM

## 2016-04-01 MED ORDER — PREDNISONE 20 MG PO TABS: 40.0000 mg | ORAL_TABLET | Freq: Every day | ORAL | Status: DC

## 2016-04-01 MED ORDER — HYDROXYZINE HCL 10 MG PO TABS: 10.0000 mg | ORAL_TABLET | Freq: Three times a day (TID) | ORAL | Status: DC | PRN

## 2016-04-01 NOTE — Patient Instructions (Signed)
Bee, Wasp, or Hornet Sting °Bees, wasps, and hornets are part of a family of insects that can sting people. These stings can cause pain and inflammation, but they are usually not serious. However, some people may have an allergic reaction to a sting. This can cause the symptoms to be more severe.  °SYMPTOMS  °Common symptoms of this condition include:  °· A red lump in the skin that sometimes has a tiny hole in the center. In some cases, a stinger may be in the center of the wound. °· Pain and itching at the sting site. °· Redness and swelling around the sting site. If you have an allergic reaction (localized allergic reaction), the swelling and redness may spread out from the sting site. In some cases, this reaction can continue to develop over the next 12-36 hours. °In rare cases, a person may have a severe allergic reaction (anaphylactic reaction) to a sting. Symptoms of an anaphylactic reaction may include:  °· Wheezing or difficulty breathing. °· Raised, itchy, red patches on the skin. °· Nausea or vomiting. °· Abdominal cramping. °· Diarrhea. °· Chest pain. °· Fainting. °· Redness of the face (flushing). °DIAGNOSIS  °This condition is usually diagnosed based on symptoms, medical history, and a physical exam. °TREATMENT  °Most stings can be treated with:  °· Icing to reduce swelling. °· Medicines (antihistamines) to treat itching or an allergic reaction. °· Medicines to help reduce pain. These may be medicines that you take by mouth, or medicated creams or lotions that you apply to your skin. °If you were stung by a bee, the stinger and a small sac of poison may be in the wound. This may be removed by brushing across it with a flat card, such as a credit card. Another method is to pinch the area and pull it out. These methods can help reduce the severity of the body's reaction to the sting.  °HOME CARE INSTRUCTIONS  °· Wash the sting site daily with soap and water as told by your health care provider. °· Apply  or take over-the-counter and prescription medicines only as told by your health care provider. °· If directed, apply ice to the sting area. °¨  Put ice in a plastic bag. °¨  Place a towel between your skin and the bag. °¨  Leave the ice on for 20 minutes, 2-3 times per day. °· Do not scratch the sting area. °· To lessen pain, try using a paste that is made of water and baking soda. Rub the paste on the sting area and leave it on for 5 minutes. °· If you had a severe allergic reaction to a sting, you may need: °¨  To wear a medical bracelet or necklace that lists the allergy. °¨  To learn when and how to use an anaphylaxis kit or epinephrine injection. Your family members may also need to learn this. °¨  To carry an anaphylaxis kit with you at all times. °SEEK MEDICAL CARE IF:  °· Your symptoms do not get better in 2-3 days. °· You have redness, swelling, or pain that spreads beyond the area of the sting. °· You have a fever. °SEEK IMMEDIATE MEDICAL CARE IF:  °You have symptoms of a severe allergic reaction. These include:  °· Wheezing or difficulty breathing. °· Chest pain. °· Light-headedness or fainting. °· Itchy, raised, red patches on the skin. °· Nausea or vomiting. °· Abdominal cramping. °· Diarrhea. °  °This information is not intended to replace advice given to you by your health care provider.   Make sure you discuss any questions you have with your health care provider. °  °Document Released: 09/14/2005 Document Revised: 06/05/2015 Document Reviewed: 01/30/2015 °Elsevier Interactive Patient Education ©2016 [employer] ° °

## 2016-04-01 NOTE — Progress Notes (Signed)
BP 118/81 mmHg  Pulse 91  Temp(Src) 97.9 F (36.6 C)  Wt 203 lb (92.08 kg)  SpO2 98%  LMP 03/02/2016 (Approximate)   Subjective:    Patient ID: Ashley Kidd, female    DOB: [birth year], 35 y.o.   MRN: [id number]  HPI: Ashley Kidd is a 35 y.o. female  Chief Complaint  Patient presents with  . Insect Bite    Was stung by something last night on her right foot. Had alot of swelling but the pain has been severe and it hurts to walk on it.   Patient presents with dull constant pain at 4/10 in right foot following a bee sting that happened last night. States the site is over her 5th MTP on right foot. Site has been red and swollen, and she is having significant pain with any weight bearing. Pain is now radiating up back of leg and into posterior thigh. Has been taking benadryl with no relief. Has never had this type of reaction to a sting before. Denies hx of gout or trauma to the area. Denies fever/chills or wounds to this area.   Relevant past medical, surgical, family and social history reviewed and updated as indicated. Interim medical history since our last visit reviewed. Allergies and medications reviewed and updated.  Review of Systems  Constitutional: Negative.   Respiratory: Negative.   Cardiovascular: Negative.   Gastrointestinal: Negative.   Musculoskeletal: Positive for joint swelling (right 5th MTP swelling), arthralgias (right foot pain) and gait problem (limping).  Skin: Negative for rash and wound.  Neurological: Negative.   Psychiatric/Behavioral: Negative.     Per HPI unless specifically indicated above     Objective:    BP 118/81 mmHg  Pulse 91  Temp(Src) 97.9 F (36.6 C)  Wt 203 lb (92.08 kg)  SpO2 98%  LMP 03/02/2016 (Approximate)  Wt Readings from Last 3 Encounters:  04/01/16 203 lb (92.08 kg)  12/23/15 210 lb (95.255 kg)  12/22/15 204 lb (92.534 kg)    Physical Exam  Constitutional: She is oriented to person, place, and time. She appears  well-developed and well-nourished.  HENT:  Head: Atraumatic.  Eyes: Conjunctivae are normal. No scleral icterus.  Neck: Normal range of motion. Neck supple.  Cardiovascular: Normal rate and intact distal pulses.   Pulmonary/Chest: Effort normal and breath sounds normal. No respiratory distress.  Musculoskeletal: Normal range of motion. She exhibits edema (mild edema over 5th MTP) and tenderness (TTP over 5th MTP).  Antalgic gait Full ROM in b/l lower extremities 5th MTP erythematous   Neurological: She is alert and oriented to person, place, and time.  Skin: Skin is warm and dry. No rash noted.  Psychiatric: She has a normal mood and affect. Her behavior is normal.  Nursing note and vitals reviewed.   Results for orders placed or performed during the hospital encounter of 12/22/15  Urine culture  Result Value Ref Range   Specimen Description URINE, RANDOM    Special Requests ciprofloxacin Normal    Culture NO GROWTH 2 DAYS    Report Status 12/24/2015 FINAL   Basic metabolic panel  Result Value Ref Range   Sodium 133 (L) 135 - 145 mmol/L   Potassium 3.3 (L) 3.5 - 5.1 mmol/L   Chloride 104 101 - 111 mmol/L   CO2 19 (L) 22 - 32 mmol/L   Glucose, Bld 114 (H) 65 - 99 mg/dL   BUN 8 6 - 20 mg/dL   Creatinine, Ser 0.73 0.44 - 1.00 mg/dL  Calcium 8.6 (L) 8.9 - 10.3 mg/dL   GFR calc non Af Amer >60 >60 mL/min   GFR calc Af Amer >60 >60 mL/min   Anion gap 10 5 - 15  CBC  Result Value Ref Range   WBC 8.4 3.6 - 11.0 K/uL   RBC 4.79 3.80 - 5.20 MIL/uL   Hemoglobin 12.8 12.0 - 16.0 g/dL   HCT 38.0 35.0 - 47.0 %   MCV 79.4 (L) 80.0 - 100.0 fL   MCH 26.7 26.0 - 34.0 pg   MCHC 33.6 32.0 - 36.0 g/dL   RDW 15.3 (H) 11.5 - 14.5 %   Platelets 449 (H) 150 - 440 K/uL  Urinalysis complete, with microscopic- may I&O cath if menses [facility] only)  Result Value Ref Range   Color, Urine AMBER (A) YELLOW   APPearance HAZY (A) CLEAR   Glucose, UA NEGATIVE NEGATIVE mg/dL   Bilirubin Urine  NEGATIVE NEGATIVE   Ketones, ur NEGATIVE NEGATIVE mg/dL   Specific Gravity, Urine 1.031 (H) 1.005 - 1.030   Hgb urine dipstick NEGATIVE NEGATIVE   pH 5.0 5.0 - 8.0   Protein, ur 30 (A) NEGATIVE mg/dL   Nitrite NEGATIVE NEGATIVE   Leukocytes, UA NEGATIVE NEGATIVE   RBC / HPF 0-5 0 - 5 RBC/hpf   WBC, UA 0-5 0 - 5 WBC/hpf   Bacteria, UA RARE (A) NONE SEEN   Squamous Epithelial / LPF 0-5 (A) NONE SEEN   Mucous PRESENT   Pregnancy, urine POC  Result Value Ref Range   Preg Test, Ur NEGATIVE NEGATIVE      Assessment & Plan:   Problem List Items Addressed This Visit    None    Visit Diagnoses    Right foot pain    -  Primary    Relevant Orders    Uric acid      Likely a local reaction from recent bee sting causing inflammation, but cannot rule out a new gout issue though my suspicion is low. Uric acid level drawn, prednisone and hydroxyzine given. Patient instructed to keep right foot elevated and use intermittent icing to help with pain and swelling. Recommended ibuprofen, but she states another provider advised her against taking NSAIDs due to some recent other medical issues. Patient to follow up if no improvement. She is agreeable to plan.   Follow up plan: No Follow-up on file.

## 2016-04-02 ENCOUNTER — Encounter: Payer: Self-pay | Admitting: Family Medicine

## 2016-04-02 LAB — URIC ACID: URIC ACID: 7 mg/dL (ref 2.5–7.1)

## 2016-04-10 ENCOUNTER — Encounter: Payer: Self-pay | Admitting: Emergency Medicine

## 2016-04-10 ENCOUNTER — Emergency Department
Admission: EM | Admit: 2016-04-10 | Discharge: 2016-04-11 | Disposition: A | Discharge status: Home / Self Care | Payer: Managed Care, Other (non HMO) | Attending: Emergency Medicine | Admitting: Emergency Medicine

## 2016-04-10 DIAGNOSIS — Z87891 Personal history of nicotine dependence: Secondary | ICD-10-CM | POA: Diagnosis not present

## 2016-04-10 DIAGNOSIS — R102 Pelvic and perineal pain: Secondary | ICD-10-CM

## 2016-04-10 DIAGNOSIS — Z7982 Long term (current) use of aspirin: Secondary | ICD-10-CM | POA: Diagnosis not present

## 2016-04-10 DIAGNOSIS — R1031 Right lower quadrant pain: Secondary | ICD-10-CM

## 2016-04-10 DIAGNOSIS — Z7984 Long term (current) use of oral hypoglycemic drugs: Secondary | ICD-10-CM | POA: Diagnosis not present

## 2016-04-10 DIAGNOSIS — J45909 Unspecified asthma, uncomplicated: Secondary | ICD-10-CM | POA: Diagnosis not present

## 2016-04-10 DIAGNOSIS — Z79899 Other long term (current) drug therapy: Secondary | ICD-10-CM | POA: Insufficient documentation

## 2016-04-10 DIAGNOSIS — N83201 Unspecified ovarian cyst, right side: Secondary | ICD-10-CM

## 2016-04-10 LAB — CBC
HEMATOCRIT: 34.7 % — AB (ref 35.0–47.0)
HEMOGLOBIN: 11.8 g/dL — AB (ref 12.0–16.0)
MCH: 26.1 pg (ref 26.0–34.0)
MCHC: 33.8 g/dL (ref 32.0–36.0)
MCV: 77.1 fL — ABNORMAL LOW (ref 80.0–100.0)
Platelets: 623 10*3/uL — ABNORMAL HIGH (ref 150–440)
RBC: 4.5 MIL/uL (ref 3.80–5.20)
RDW: 15 % — ABNORMAL HIGH (ref 11.5–14.5)
WBC: 15.8 10*3/uL — AB (ref 3.6–11.0)

## 2016-04-10 LAB — COMPREHENSIVE METABOLIC PANEL
ALK PHOS: 104 U/L (ref 38–126)
ALT: 23 U/L (ref 14–54)
ANION GAP: 8 (ref 5–15)
AST: 21 U/L (ref 15–41)
Albumin: 3.8 g/dL (ref 3.5–5.0)
BILIRUBIN TOTAL: 0.3 mg/dL (ref 0.3–1.2)
BUN: 7 mg/dL (ref 6–20)
CALCIUM: 8.6 mg/dL — AB (ref 8.9–10.3)
CO2: 22 mmol/L (ref 22–32)
Chloride: 107 mmol/L (ref 101–111)
Creatinine, Ser: 0.85 mg/dL (ref 0.44–1.00)
Glucose, Bld: 140 mg/dL — ABNORMAL HIGH (ref 65–99)
POTASSIUM: 3.5 mmol/L (ref 3.5–5.1)
Sodium: 137 mmol/L (ref 135–145)
TOTAL PROTEIN: 7.2 g/dL (ref 6.5–8.1)

## 2016-04-10 LAB — URINALYSIS COMPLETE WITH MICROSCOPIC (ARMC ONLY)
Bilirubin Urine: NEGATIVE
Glucose, UA: NEGATIVE mg/dL
HGB URINE DIPSTICK: NEGATIVE
KETONES UR: NEGATIVE mg/dL
Leukocytes, UA: NEGATIVE
NITRITE: NEGATIVE
PH: 6 (ref 5.0–8.0)
PROTEIN: NEGATIVE mg/dL
SPECIFIC GRAVITY, URINE: 1.005 (ref 1.005–1.030)

## 2016-04-10 LAB — LIPASE, BLOOD: Lipase: 22 U/L (ref 11–51)

## 2016-04-10 LAB — POC URINE PREG, ED: Preg Test, Ur: NEGATIVE

## 2016-04-10 MED ORDER — KETOROLAC TROMETHAMINE 30 MG/ML IJ SOLN
15.0000 mg | Freq: Once | INTRAMUSCULAR | Status: AC
  Administered 2016-04-10: 15 mg via INTRAVENOUS
  Filled 2016-04-10: qty 1

## 2016-04-10 NOTE — ED Notes (Signed)
Pt reports pain to her right lower abd in her pelvic region. Reports hx of ovarian cyst and reports this feels like one has ruptured.

## 2016-04-10 NOTE — ED Notes (Signed)
Pt states unable to give urine sample at this time.  Pt instructed to press call bell when able to give sample.  Pt verbalized understanding

## 2016-04-10 NOTE — ED Provider Notes (Signed)
[facility] Emergency Department Provider Note  ____________________________________________  Time seen: Approximately 11:04 PM  I have reviewed the triage vital signs and the nursing notes.   HISTORY  Chief Complaint Ovarian Cyst and Abdominal Pain    HPI Ashley Kidd is a 35 y.o. female who reports a history of PCOS and who reports she has had a cholecystectomy and surgery to remove ovarian cysts in the past.  She reports to the emergency department today for evaluation of right lower quadrant pain that started gradually about 6 hours ago and has steadily gotten worse.  She says it waxes and wanes in intensity but has remained present.  She describes it as a sharp and aching pain that is present "around my ovary".  She has had nausea but no vomiting.  She denies fever/chills, chest pain, shortness of breath, dysuria, vaginal bleeding, vaginal discharge.  She had some vaginal bleeding that she believes was her period last week for sure.  But she is on birth control so has very minimal and irregular periods.  She says this feels similar to prior issues she has had with ovarian cysts.She has had no changes in bowel habits.   Past Medical History  Diagnosis Date  . GERD (gastroesophageal reflux disease)   . PCOS (polycystic ovarian syndrome)   . Sinus complaint   . Migraine   . Allergy   . Asthma     Patient Active Problem List   Diagnosis Date Noted  . C. difficile diarrhea 12/24/2015  . Migraine   . Allergy   . Asthma   . Low back pain 03/26/2015  . Chronic cholecystitis without calculus 05/19/2013    Past Surgical History  Procedure Laterality Date  . Ovarian cyst removal Bilateral 2002  . Upper gi endoscopy  11-29-12    Dr Candace Cruise  . Cholecystectomy  06-09-13    Current Outpatient Rx  Name  Route  Sig  Dispense  Refill  . albuterol (PROVENTIL HFA;VENTOLIN HFA) 108 (90 BASE) MCG/ACT inhaler   Inhalation   Inhale 2 puffs into the lungs every  6 (six) hours as needed for wheezing.         . cetirizine (ZYRTEC) 10 MG tablet   Oral   Take 10 mg by mouth daily.         . citalopram (CELEXA) 20 MG tablet   Oral   Take 20 mg by mouth daily.         . fluticasone (FLONASE) 50 MCG/ACT nasal spray      SPRAY TWICE IN EACH NOSTRIL EVERY DAY   16 g   12   . levonorgestrel-ethinyl estradiol (NORDETTE) 0.15-30 MG-MCG tablet   Oral   Take 1 tablet by mouth daily.         . metFORMIN (GLUCOPHAGE) 1000 MG tablet   Oral   Take 1,000 mg by mouth 2 (two) times daily with a meal.         . omeprazole (PRILOSEC) 20 MG capsule   Oral   Take 20 mg by mouth daily.         . Phendimetrazine Tartrate 35 MG TABS   Oral   Take 35 mg by mouth 2 (two) times daily.      0   . topiramate (TOPAMAX) 25 MG tablet   Oral   Take 25 mg by mouth 2 (two) times daily.      0   . aspirin 81 MG tablet   Oral  Take 81 mg by mouth daily.         . budesonide-formoterol (SYMBICORT) 160-4.5 MCG/ACT inhaler   Inhalation   Inhale 2 puffs into the lungs 2 (two) times daily.         . diphenhydrAMINE (BENADRYL) 25 mg capsule   Oral   Take 25 mg by mouth every 6 (six) hours as needed for itching.         . docusate sodium (COLACE) 100 MG capsule      Take 1 tablet once or twice daily as needed for constipation while taking narcotic pain medicine   30 capsule   0   . HYDROcodone-acetaminophen (NORCO/VICODIN) 5-325 MG tablet   Oral   Take 1-2 tablets by mouth every 4 (four) hours as needed for moderate pain.   15 tablet   0   . hydrOXYzine (ATARAX/VISTARIL) 10 MG tablet   Oral   Take 1 tablet (10 mg total) by mouth 3 (three) times daily as needed.   30 tablet   0   . ondansetron (ZOFRAN ODT) 4 MG disintegrating tablet   Oral   Take 1 tablet (4 mg total) by mouth every 8 (eight) hours as needed for nausea or vomiting.   20 tablet   0   . predniSONE (DELTASONE) 20 MG tablet   Oral   Take 2 tablets (40 mg total) by  mouth daily with breakfast.   10 tablet   0   . promethazine (PHENERGAN) 25 MG tablet   Oral   Take 1 tablet (25 mg total) by mouth every 6 (six) hours as needed for nausea or vomiting.   30 tablet   0   . Vitamin D, Ergocalciferol, (DRISDOL) 50000 units CAPS capsule   Oral   Take 50,000 Units by mouth once a week.      3     Allergies Review of patient's allergies indicates no known allergies.  Family History  Problem Relation Age of Onset  . Diabetes Mother   . Asthma Mother   . Diabetes Father   . Heart attack Father   . Heart disease Father   . Asthma Sister   . Bipolar disorder Sister   . Asthma Brother   . Bipolar disorder Brother   . Epilepsy Brother     Social History Social History  Substance Use Topics  . Smoking status: Former Smoker -- 12 years    Quit date: 08/24/2012  . Smokeless tobacco: Never Used  . Alcohol Use: No    Review of Systems Constitutional: No fever/chills Eyes: No visual changes. ENT: No sore throat. Cardiovascular: Denies chest pain. Respiratory: Denies shortness of breath. Gastrointestinal: RLQ abdominal pain.  Nausea, no vomiting.  No diarrhea.  No constipation. Genitourinary: Negative for dysuria. Musculoskeletal: Negative for back pain. Skin: Negative for rash. Neurological: Negative for headaches, focal weakness or numbness.  10-point ROS otherwise negative.  ____________________________________________   PHYSICAL EXAM:  VITAL SIGNS: ED Triage Vitals  Enc Vitals Group     BP 04/10/16 2201 131/82 mmHg     Pulse Rate 04/10/16 2201 103     Resp 04/10/16 2201 20     Temp 04/10/16 2201 98.6 F (37 C)     Temp Source 04/10/16 2201 Oral     SpO2 04/10/16 2201 98 %     Weight 04/10/16 2201 200 lb (90.719 kg)     Height 04/10/16 2201 5\' 7"  (1.702 m)     Head Cir --  Peak Flow --      Pain Score 04/10/16 2205 6     Pain Loc --      Pain Edu? --      Excl. in [place]? --     Constitutional: Alert and oriented.  Well appearing , NAD but appears uncomfortable Eyes: Conjunctivae are normal. PERRL. EOMI. Head: Atraumatic. Nose: No congestion/rhinnorhea. Mouth/Throat: Mucous membranes are moist.  Oropharynx non-erythematous. Neck: No stridor.  No meningeal signs.   Cardiovascular: Normal rate, regular rhythm. Good peripheral circulation. Grossly normal heart sounds.   Respiratory: Normal respiratory effort.  No retractions. Lungs CTAB. Gastrointestinal: Soft with minimal TTP of RLQ.  No rebound nor guarding. Musculoskeletal: No lower extremity tenderness nor edema. No gross deformities of extremities. Neurologic:  Normal speech and language. No gross focal neurologic deficits are appreciated.  Skin:  Skin is warm, dry and intact. No rash noted. Psychiatric: Mood and affect are normal. Speech and behavior are normal.  ____________________________________________   LABS (all labs ordered are listed, but only abnormal results are displayed)  Labs Reviewed  COMPREHENSIVE METABOLIC PANEL - Abnormal; Notable for the following:    Glucose, Bld 140 (*)    Calcium 8.6 (*)    All other components within normal limits  CBC - Abnormal; Notable for the following:    WBC 15.8 (*)    Hemoglobin 11.8 (*)    HCT 34.7 (*)    MCV 77.1 (*)    RDW 15.0 (*)    Platelets 623 (*)    All other components within normal limits  URINALYSIS COMPLETEWITH MICROSCOPIC ([facility] ONLY) - Abnormal; Notable for the following:    Color, Urine STRAW (*)    APPearance CLEAR (*)    Bacteria, UA MANY (*)    Squamous Epithelial / LPF 0-5 (*)    All other components within normal limits  LIPASE, BLOOD  POC URINE PREG, ED   ____________________________________________  EKG  None ____________________________________________  RADIOLOGY   US Transvaginal Non-ob  04/11/2016  CLINICAL DATA:  35 year old female with right pelvic pain x6 hours. EXAM: TRANSABDOMINAL AND TRANSVAGINAL ULTRASOUND OF PELVIS DOPPLER ULTRASOUND OF  OVARIES TECHNIQUE: Both transabdominal and transvaginal ultrasound examinations of the pelvis were performed. Transabdominal technique was performed for global imaging of the pelvis including uterus, ovaries, adnexal regions, and pelvic cul-de-sac. It was necessary to proceed with endovaginal exam following the transabdominal exam to visualize the endometrium and the ovaries. Color and duplex Doppler ultrasound was utilized to evaluate blood flow to the ovaries. COMPARISON:  CT of the abdomen pelvis dated 12/22/2015 FINDINGS: Uterus Measurements: 7.0 x 3.3 x 4.0 cm. There is a 1.1 x 1.2 x 1.3 cm fibroid in the posterior right uterus. Endometrium Thickness: 4 mm.  No focal abnormality visualized. Right ovary Measurements: 5.7 x 4.1 x 4.0 cm. There is a 5.2 x 4.0 x 3.6 cm cyst in the right ovary. Left ovary Measurements: 2.5 x 2.0 x 2.2 cm. Normal appearance/no adnexal mass. Pulsed Doppler evaluation of both ovaries demonstrates normal low-resistance arterial and venous waveforms. Other findings No abnormal free fluid. IMPRESSION: Right right ovarian cyst otherwise unremarkable pelvic ultrasound. Annual follow-up with ultrasound recommended. Electronically Signed   By: Anner Crete M.D.   On: 04/11/2016 01:58   US Pelvis Complete  04/11/2016  CLINICAL DATA:  35 year old female with right pelvic pain x6 hours. EXAM: TRANSABDOMINAL AND TRANSVAGINAL ULTRASOUND OF PELVIS DOPPLER ULTRASOUND OF OVARIES TECHNIQUE: Both transabdominal and transvaginal ultrasound examinations of the pelvis were performed. Transabdominal technique was  performed for global imaging of the pelvis including uterus, ovaries, adnexal regions, and pelvic cul-de-sac. It was necessary to proceed with endovaginal exam following the transabdominal exam to visualize the endometrium and the ovaries. Color and duplex Doppler ultrasound was utilized to evaluate blood flow to the ovaries. COMPARISON:  CT of the abdomen pelvis dated 12/22/2015 FINDINGS:  Uterus Measurements: 7.0 x 3.3 x 4.0 cm. There is a 1.1 x 1.2 x 1.3 cm fibroid in the posterior right uterus. Endometrium Thickness: 4 mm.  No focal abnormality visualized. Right ovary Measurements: 5.7 x 4.1 x 4.0 cm. There is a 5.2 x 4.0 x 3.6 cm cyst in the right ovary. Left ovary Measurements: 2.5 x 2.0 x 2.2 cm. Normal appearance/no adnexal mass. Pulsed Doppler evaluation of both ovaries demonstrates normal low-resistance arterial and venous waveforms. Other findings No abnormal free fluid. IMPRESSION: Right right ovarian cyst otherwise unremarkable pelvic ultrasound. Annual follow-up with ultrasound recommended. Electronically Signed   By: Anner Crete M.D.   On: 04/11/2016 01:58   [place] Art/ven Flow Abd Pelv Doppler  04/11/2016  CLINICAL DATA:  35 year old female with right pelvic pain x6 hours. EXAM: TRANSABDOMINAL AND TRANSVAGINAL ULTRASOUND OF PELVIS DOPPLER ULTRASOUND OF OVARIES TECHNIQUE: Both transabdominal and transvaginal ultrasound examinations of the pelvis were performed. Transabdominal technique was performed for global imaging of the pelvis including uterus, ovaries, adnexal regions, and pelvic cul-de-sac. It was necessary to proceed with endovaginal exam following the transabdominal exam to visualize the endometrium and the ovaries. Color and duplex Doppler ultrasound was utilized to evaluate blood flow to the ovaries. COMPARISON:  CT of the abdomen pelvis dated 12/22/2015 FINDINGS: Uterus Measurements: 7.0 x 3.3 x 4.0 cm. There is a 1.1 x 1.2 x 1.3 cm fibroid in the posterior right uterus. Endometrium Thickness: 4 mm.  No focal abnormality visualized. Right ovary Measurements: 5.7 x 4.1 x 4.0 cm. There is a 5.2 x 4.0 x 3.6 cm cyst in the right ovary. Left ovary Measurements: 2.5 x 2.0 x 2.2 cm. Normal appearance/no adnexal mass. Pulsed Doppler evaluation of both ovaries demonstrates normal low-resistance arterial and venous waveforms. Other findings No abnormal free fluid. IMPRESSION: Right  right ovarian cyst otherwise unremarkable pelvic ultrasound. Annual follow-up with ultrasound recommended. Electronically Signed   By: Anner Crete M.D.   On: 04/11/2016 01:58    ____________________________________________   PROCEDURES  Procedure(s) performed:   Procedures   ____________________________________________   INITIAL IMPRESSION / ASSESSMENT AND PLAN / ED COURSE  Pertinent labs & imaging results that were available during my care of the patient were reviewed by me and considered in my medical decision making (see chart for details).  The patient's exam is reassuring.  She continued to give history while I was palpating her abdomen and did not react to the palpation but did report mild tenderness.  She has no tenderness specifically at McBurney's point I have very low suspicion for appendicitis.  Given her history I will evaluate with an ultrasound for ovarian cysts including checking Dopplers to rule out torsion.  I anticipate that we will be able to discharge her with pain control and follow-up as an outpatient with her GYN.  Her labs are reassuring although she does have a moderate leukocytosis.  She is in a monogamous relationship and her husband, a Warden/ranger is currently present.  I offered a pelvic exam, but she declines at this time, which I think is appropriate.  ----------------------------------------- 2:14 AM on 04/11/2016 -----------------------------------------  Large ovarian cyst on the right  but not ruptured with no pelvic free fluid and no evidence of torsion.  Patient states she felt better after Toradol.  I had a long discussion with her and she knows to come back if her symptoms worsen.    I reviewed the patient's prescription history over the last 12 months in the [place] Controlled Substances Database, and she has had no narcotics prescriptions filled within this time.  I will give her a prescription for a few pain pills to help her during the weekend  and she will follow up with her GYN. ____________________________________________  FINAL CLINICAL IMPRESSION(S) / ED DIAGNOSES  Final diagnoses:  Right ovarian cyst  RLQ abdominal pain     MEDICATIONS GIVEN DURING THIS VISIT:  Medications  ketorolac (TORADOL) 30 MG/ML injection 15 mg (15 mg Intravenous Given 04/10/16 2322)     NEW OUTPATIENT MEDICATIONS STARTED DURING THIS VISIT:  New Prescriptions   DOCUSATE SODIUM (COLACE) 100 MG CAPSULE    Take 1 tablet once or twice daily as needed for constipation while taking narcotic pain medicine   HYDROCODONE-ACETAMINOPHEN (NORCO/VICODIN) 5-325 MG TABLET    Take 1-2 tablets by mouth every 4 (four) hours as needed for moderate pain.      Note:  This document was prepared using Dragon voice recognition software and may include unintentional dictation errors.   Hinda Kehr, MD 04/11/16 [phone]

## 2016-04-11 ENCOUNTER — Emergency Department: Payer: Managed Care, Other (non HMO)

## 2016-04-11 MED ORDER — HYDROCODONE-ACETAMINOPHEN 5-325 MG PO TABS: 1.0000 | ORAL_TABLET | ORAL | Status: DC | PRN

## 2016-04-11 MED ORDER — DOCUSATE SODIUM 100 MG PO CAPS: ORAL_CAPSULE | ORAL | Status: DC

## 2016-04-11 NOTE — Discharge Instructions (Signed)
You have been seen in the Emergency Department (ED) for abdominal pain.  We believe your pain is mostly likely caused by a ruptured ovarian cyst, which is generally a benign but potentially painful process.  Please read through the included information and follow up as instructed above regarding todays emergent visit and the symptoms that are bothering you.  Take over-the-counter ibuprofen and Tylenol as needed for pain control (unless your doctor has told you in the past to avoid either of these medications).  Take Norco as prescribed for severe pain. Do not drink alcohol, drive or participate in any other potentially dangerous activities while taking this medication as it may make you sleepy. Do not take this medication with any other sedating medications, either prescription or over-the-counter. If you were prescribed Percocet or Vicodin, do not take these with acetaminophen (Tylenol) as it is already contained within these medications.   This medication is an opiate (or narcotic) pain medication and can be habit forming.  Use it as little as possible to achieve adequate pain control.  Do not use or use it with extreme caution if you have a history of opiate abuse or dependence.  If you are on a pain contract with your primary care doctor or a pain specialist, be sure to let them know you were prescribed this medication today from the [facility] Emergency Department.  This medication is intended for your use only - do not give any to anyone else and keep it in a secure place where nobody else, especially children, have access to it.  It will also cause or worsen constipation, so you may want to consider taking an over-the-counter stool softener while you are taking this medication.   Return to the ED if your abdominal pain worsens or fails to improve, you develop bloody vomiting, bloody diarrhea, you are unable to tolerate fluids due to vomiting, fever greater than 101, or other symptoms that  concern you.  Ovarian Cyst    An ovarian cyst is a fluid-filled sac that forms on an ovary. The ovaries are small organs that produce eggs in women. Various types of cysts can form on the ovaries. Most are not cancerous. Many do not cause problems, and they often go away on their own. Some may cause symptoms and require treatment. Common types of ovarian cysts include:  Functional cysts--These cysts may occur every month during the menstrual cycle. This is normal. The cysts usually go away with the next menstrual cycle if the woman does not get pregnant. Usually, there are no symptoms with a functional cyst.  Endometrioma cysts--These cysts form from the tissue that lines the uterus. They are also called "chocolate cysts" because they become filled with blood that turns brown. This type of cyst can cause pain in the lower abdomen during intercourse and with your menstrual period.  Cystadenoma cysts--This type develops from the cells on the outside of the ovary. These cysts can get very big and cause lower abdomen pain and pain with intercourse. This type of cyst can twist on itself, cut off its blood supply, and cause severe pain. It can also easily rupture and cause a lot of pain.  Dermoid cysts--This type of cyst is sometimes found in both ovaries. These cysts may contain different kinds of body tissue, such as skin, teeth, hair, or cartilage. They usually do not cause symptoms unless they get very big.  Theca lutein cysts--These cysts occur when too much of a certain hormone (human chorionic gonadotropin) is  produced and overstimulates the ovaries to produce an egg. This is most common after procedures used to assist with the conception of a baby (in vitro fertilization). CAUSES  Fertility drugs can cause a condition in which multiple large cysts are formed on the ovaries. This is called ovarian hyperstimulation syndrome.  A condition called polycystic ovary syndrome can cause hormonal imbalances  that can lead to nonfunctional ovarian cysts. SIGNS AND SYMPTOMS  Many ovarian cysts do not cause symptoms. If symptoms are present, they may include:  Pelvic pain or pressure.  Pain in the lower abdomen.  Pain during sexual intercourse.  Increasing girth (swelling) of the abdomen.  Abnormal menstrual periods.  Increasing pain with menstrual periods.  Stopping having menstrual periods without being pregnant. DIAGNOSIS  These cysts are commonly found during a routine or annual pelvic exam. Tests may be ordered to find out more about the cyst. These tests may include:  Ultrasound.  X-ray of the pelvis.  CT scan.  MRI.  Blood tests. TREATMENT  Many ovarian cysts go away on their own without treatment. Your health care provider may want to check your cyst regularly for 2-3 months to see if it changes. For women in menopause, it is particularly important to monitor a cyst closely because of the higher rate of ovarian cancer in menopausal women. When treatment is needed, it may include any of the following:  A procedure to drain the cyst (aspiration). This may be done using a long needle and ultrasound. It can also be done through a laparoscopic procedure. This involves using a thin, lighted tube with a tiny camera on the end (laparoscope) inserted through a small incision.  Surgery to remove the whole cyst. This may be done using laparoscopic surgery or an open surgery involving a larger incision in the lower abdomen.  Hormone treatment or birth control pills. These methods are sometimes used to help dissolve a cyst. HOME CARE INSTRUCTIONS  Only take over-the-counter or prescription medicines as directed by your health care provider.  Follow up with your health care provider as directed.  Get regular pelvic exams and Pap tests. SEEK MEDICAL CARE IF:  Your periods are late, irregular, or painful, or they stop.  Your pelvic pain or abdominal pain does not go away.  Your abdomen becomes larger  or swollen.  You have pressure on your bladder or trouble emptying your bladder completely.  You have pain during sexual intercourse.  You have feelings of fullness, pressure, or discomfort in your stomach.  You lose weight for no apparent reason.  You feel generally ill.  You become constipated.  You lose your appetite.  You develop acne.  You have an increase in body and facial hair.  You are gaining weight, without changing your exercise and eating habits.  You think you are pregnant. SEEK IMMEDIATE MEDICAL CARE IF:  You have increasing abdominal pain.  You feel sick to your stomach (nauseous), and you throw up (vomit).  You develop a fever that comes on suddenly.  You have abdominal pain during a bowel movement.  Your menstrual periods become heavier than usual. MAKE SURE YOU:  Understand these instructions.  Will watch your condition.  Will get help right away if you are not doing well or get worse. This information is not intended to replace advice given to you by your health care provider. Make sure you discuss any questions you have with your health care provider.  Document Released: 09/14/2005 Document Revised: 09/19/2013 Document Reviewed: 05/22/2013  Chartered certified accountant Patient Education [employer].

## 2016-05-25 ENCOUNTER — Ambulatory Visit: Payer: Managed Care, Other (non HMO) | Admitting: Family Medicine

## 2016-05-29 ENCOUNTER — Encounter: Payer: Self-pay | Admitting: Family Medicine

## 2016-05-29 ENCOUNTER — Ambulatory Visit (INDEPENDENT_AMBULATORY_CARE_PROVIDER_SITE_OTHER): Payer: Managed Care, Other (non HMO) | Admitting: Family Medicine

## 2016-05-29 VITALS — BP 122/85 | HR 106 | Temp 98.5°F | Wt 200.0 lb

## 2016-05-29 DIAGNOSIS — S91331D Puncture wound without foreign body, right foot, subsequent encounter: Secondary | ICD-10-CM | POA: Diagnosis not present

## 2016-05-29 NOTE — Patient Instructions (Signed)
Follow up as needed

## 2016-05-29 NOTE — Progress Notes (Signed)
   BP 122/85   Pulse (!) 106   Temp 98.5 F (36.9 C)   Wt 200 lb (90.7 kg)   LMP 05/28/2016 (Exact Date)   SpO2 99%   BMI 31.32 kg/m    Subjective:    Patient ID: Ashley Kidd, female    DOB: [birth year], 35 y.o.   MRN: [id number]  HPI: Ashley Kidd is a 35 y.o. female  Chief Complaint  Patient presents with  . Follow-up    [place] her right foot. Still on antibiotic. No longer painful, no redness, no swelling.   Patient presents for ER f/u from getting several large splinters removed from right [place]. Still completing abx. Healing well, no redness, swelling, or purulent drainage. Denies fever or chills. Tetenus was refreshed at ER visit.   Relevant past medical, surgical, family and social history reviewed and updated as indicated. Interim medical history since our last visit reviewed. Allergies and medications reviewed and updated.  Review of Systems  Constitutional: Negative.   HENT: Negative.   Respiratory: Negative.   Gastrointestinal: Negative.   Genitourinary: Negative.   Musculoskeletal: Negative.   Skin: Positive for wound (right foot).  Neurological: Negative.   Psychiatric/Behavioral: Negative.     Per HPI unless specifically indicated above     Objective:    BP 122/85   Pulse (!) 106   Temp 98.5 F (36.9 C)   Wt 200 lb (90.7 kg)   LMP 05/28/2016 (Exact Date)   SpO2 99%   BMI 31.32 kg/m   Wt Readings from Last 3 Encounters:  05/29/16 200 lb (90.7 kg)  04/10/16 200 lb (90.7 kg)  04/01/16 203 lb (92.1 kg)    Physical Exam  Constitutional: She is oriented to person, place, and time. She appears well-developed and well-nourished. No distress.  HENT:  Head: Atraumatic.  Eyes: Conjunctivae are normal. No scleral icterus.  Neck: Neck supple.  Cardiovascular: Normal rate, regular rhythm and normal heart sounds.   Pulmonary/Chest: Effort normal. No respiratory  distress.  Musculoskeletal: Normal range of motion. She exhibits no edema or tenderness.  Neurological: She is alert and oriented to person, place, and time.  Skin: Skin is warm and dry.  2 linear wounds on bottom of right foot, healing very well with no surrounding erythema or drainage present at site.   Psychiatric: She has a normal mood and affect. Her behavior is normal.  Nursing note and vitals reviewed.     Assessment & Plan:   Problem List Items Addressed This Visit    None    Visit Diagnoses    Penetrating foot wound, right, subsequent encounter    -  Primary   Healing well with no complications noted. Continue good wound care and complete full course of abx. Follow up if worsening or changing symptoms.        Follow up plan: Return if symptoms worsen or fail to improve.

## 2016-06-02 ENCOUNTER — Other Ambulatory Visit: Payer: Self-pay | Admitting: Unknown Physician Specialty

## 2016-06-16 ENCOUNTER — Encounter: Payer: Self-pay | Admitting: Family Medicine

## 2016-06-16 ENCOUNTER — Ambulatory Visit (INDEPENDENT_AMBULATORY_CARE_PROVIDER_SITE_OTHER): Payer: Managed Care, Other (non HMO) | Admitting: Family Medicine

## 2016-06-16 VITALS — BP 112/77 | HR 122 | Temp 97.6°F | Wt 199.0 lb

## 2016-06-16 DIAGNOSIS — J4541 Moderate persistent asthma with (acute) exacerbation: Secondary | ICD-10-CM | POA: Diagnosis not present

## 2016-06-16 MED ORDER — ALBUTEROL SULFATE (2.5 MG/3ML) 0.083% IN NEBU
2.5000 mg | INHALATION_SOLUTION | RESPIRATORY_TRACT | Status: AC | PRN
  Administered 2016-06-16: 2.5 mg via RESPIRATORY_TRACT

## 2016-06-16 MED ORDER — ALBUTEROL SULFATE HFA 108 (90 BASE) MCG/ACT IN AERS: 2.0000 | INHALATION_SPRAY | Freq: Four times a day (QID) | RESPIRATORY_TRACT | 12 refills | Status: DC | PRN

## 2016-06-16 MED ORDER — TRIAMCINOLONE ACETONIDE 40 MG/ML IJ SUSP
40.0000 mg | Freq: Once | INTRAMUSCULAR | Status: AC
  Administered 2016-06-16: 40 mg via INTRAMUSCULAR

## 2016-06-16 NOTE — Assessment & Plan Note (Signed)
IM Kenalog and albuterol nebulizer given in office with good symptomatic improvement. On exam, air movement greatly improved and no wheezing. Refilled home albuterol. Continue symbicort as maintenance.

## 2016-06-16 NOTE — Progress Notes (Signed)
BP 112/77   Pulse (!) 122   Temp 97.6 F (36.4 C)   Wt 199 lb (90.3 kg)   LMP 05/28/2016 (Exact Date)   SpO2 99%   BMI 31.17 kg/m    Subjective:    Patient ID: Ashley Kidd, female    DOB: 04/05/1981, 35 y.o.   MRN: [id number]  HPI: Ashley Kidd is a 35 y.o. female  Chief Complaint  Patient presents with  . Asthma    having an asthma attack this morning, cleaning lady sprayed lysol. last used albuterol inhaler an hour ago   Patient presents with asthma exacerbation that started earlier this morning at work. States the cleaning lady sprayed a lot of lysol in her office and it triggered severe chest tightness an SOB. Has improved some with use of her albuterol inhaler, but still feeling very tight in chest. Denies CP.    Relevant past medical, surgical, family and social history reviewed and updated as indicated. Interim medical history since our last visit reviewed. Allergies and medications reviewed and updated.  Review of Systems  Constitutional: Negative.   HENT: Negative.   Eyes: Negative.   Respiratory: Positive for chest tightness, shortness of breath and wheezing.   Cardiovascular: Negative.   Gastrointestinal: Negative.   Genitourinary: Negative.   Musculoskeletal: Negative.   Skin: Negative.   Neurological: Negative.   Psychiatric/Behavioral: The patient is nervous/anxious.     Per HPI unless specifically indicated above     Objective:    BP 112/77   Pulse (!) 122   Temp 97.6 F (36.4 C)   Wt 199 lb (90.3 kg)   LMP 05/28/2016 (Exact Date)   SpO2 99%   BMI 31.17 kg/m   Wt Readings from Last 3 Encounters:  06/16/16 199 lb (90.3 kg)  05/29/16 200 lb (90.7 kg)  04/10/16 200 lb (90.7 kg)    Physical Exam  Constitutional: She is oriented to person, place, and time. She appears well-developed and well-nourished.  HENT:  Head: Atraumatic.  Eyes: Conjunctivae are normal. No scleral icterus.  Neck: Normal range of motion. Neck supple.    Cardiovascular: Normal heart sounds.   Tachycardic  Pulmonary/Chest: She has no wheezes.  Mildly tachypneic  Mildly decreased airflow with breaths  Musculoskeletal: Normal range of motion.  Lymphadenopathy:    She has no cervical adenopathy.  Neurological: She is alert and oriented to person, place, and time.  Skin: Skin is warm and dry.  Psychiatric: Thought content normal.  Very anxious, breathless when talking  Nursing note and vitals reviewed.     Assessment & Plan:   Problem List Items Addressed This Visit      Respiratory   Asthma - Primary    IM Kenalog and albuterol nebulizer given in office with good symptomatic improvement. On exam, air movement greatly improved and no wheezing. Refilled home albuterol. Continue symbicort as maintenance.       Relevant Medications   albuterol (PROVENTIL HFA;VENTOLIN HFA) 108 (90 Base) MCG/ACT inhaler   triamcinolone acetonide (KENALOG-40) injection 40 mg (Completed)   albuterol (PROVENTIL) (2.5 MG/3ML) 0.083% nebulizer solution 2.5 mg (Completed)   Other Relevant Orders   PR NONINVASV OXYGEN SATUR;SINGLE    Other Visit Diagnoses   None.   Patient overall feeling much better, but still tachycardic on recheck. Advised her to keep a close eye on symptoms, strict return precautions given regarding CP, SOB, palpitations or any other worsening or changing symptoms.   Follow up plan: Return if symptoms  worsen or fail to improve.

## 2016-06-16 NOTE — Patient Instructions (Signed)
Follow up as needed

## 2016-08-04 ENCOUNTER — Encounter: Payer: Self-pay | Admitting: Family Medicine

## 2016-08-04 ENCOUNTER — Ambulatory Visit
Admission: RE | Admit: 2016-08-04 | Discharge: 2016-08-04 | Disposition: A | Discharge status: Home / Self Care | Payer: Managed Care, Other (non HMO) | Source: Ambulatory Visit | Attending: Family Medicine | Admitting: Family Medicine

## 2016-08-04 ENCOUNTER — Ambulatory Visit: Admit: 2016-08-04 | Payer: Self-pay | Source: Ambulatory Visit

## 2016-08-04 ENCOUNTER — Ambulatory Visit (INDEPENDENT_AMBULATORY_CARE_PROVIDER_SITE_OTHER): Payer: Managed Care, Other (non HMO) | Admitting: Family Medicine

## 2016-08-04 VITALS — BP 124/85 | HR 103 | Temp 98.8°F | Wt 198.6 lb

## 2016-08-04 DIAGNOSIS — M25571 Pain in right ankle and joints of right foot: Secondary | ICD-10-CM

## 2016-08-04 DIAGNOSIS — M7989 Other specified soft tissue disorders: Secondary | ICD-10-CM | POA: Diagnosis not present

## 2016-08-04 NOTE — Patient Instructions (Signed)
Follow up as needed

## 2016-08-04 NOTE — Progress Notes (Signed)
   BP 124/85 (BP Location: Right Arm, Patient Position: Sitting, Cuff Size: Large)   Pulse (!) 103   Temp 98.8 F (37.1 C)   Wt 198 lb 9.6 oz (90.1 kg)   SpO2 98%   BMI 31.11 kg/m    Subjective:    Patient ID: Ashley Kidd, female    DOB: July 06, 1981, 35 y.o.   MRN: [id number]  HPI: Ashley Kidd is a 35 y.o. female  Chief Complaint  Patient presents with  . Ankle Pain    wallking through office and tripped over a box pt states that her ankle went under her she heard a pop and it started swelling. Pins and neeeldes in foot hurts from shin to toes.    Patient presents with right ankle pain from a fall this morning while at work. States she tripped over a box and rolled her ankle. She heard a loud pop and had immediate pain, bruising, and swelling. Very painful to bear weight so had been keeping it propped up with ice since it happened. Has not yet taken anything for pain.   Relevant past medical, surgical, family and social history reviewed and updated as indicated. Interim medical history since our last visit reviewed. Allergies and medications reviewed and updated.  Review of Systems  HENT: Negative.   Eyes: Negative.   Respiratory: Negative.   Cardiovascular: Negative.   Gastrointestinal: Negative.   Genitourinary: Negative.   Musculoskeletal: Positive for arthralgias and joint swelling.  Skin: Negative.   Neurological: Positive for weakness and numbness.  Psychiatric/Behavioral: Negative.     Per HPI unless specifically indicated above     Objective:    BP 124/85 (BP Location: Right Arm, Patient Position: Sitting, Cuff Size: Large)   Pulse (!) 103   Temp 98.8 F (37.1 C)   Wt 198 lb 9.6 oz (90.1 kg)   SpO2 98%   BMI 31.11 kg/m   Wt Readings from Last 3 Encounters:  08/04/16 198 lb 9.6 oz (90.1 kg)  06/16/16 199 lb (90.3 kg)  05/29/16 200 lb (90.7 kg)    Physical Exam  Constitutional: She is oriented to person, place, and time. She appears  well-developed and well-nourished. No distress.  HENT:  Head: Atraumatic.  Eyes: Conjunctivae are normal. No scleral icterus.  Neck: Normal range of motion. Neck supple.  Cardiovascular: Normal rate and intact distal pulses.   Pulmonary/Chest: Effort normal and breath sounds normal. No respiratory distress.  Musculoskeletal:  Right lateral ankle bruised, with focal swelling over TFL ROM stiff and very painful in right ankle   Neurological: She is alert and oriented to person, place, and time.  Right LE neurovascularly intact  Skin: Skin is warm and dry.  Psychiatric: She has a normal mood and affect. Her behavior is normal.  Nursing note and vitals reviewed.      Assessment & Plan:   Problem List Items Addressed This Visit    None    Visit Diagnoses    Acute right ankle pain    -  Primary   Will obtain right ankle x-ray to r/o fx. RICE, tylenol first day or so then can switch to ibuprofen. Await results   Relevant Orders   DG Ankle Complete Right       Follow up plan: Return if symptoms worsen or fail to improve.

## 2016-08-11 ENCOUNTER — Encounter: Payer: Self-pay | Admitting: Unknown Physician Specialty

## 2016-08-11 ENCOUNTER — Ambulatory Visit (INDEPENDENT_AMBULATORY_CARE_PROVIDER_SITE_OTHER): Payer: Managed Care, Other (non HMO) | Admitting: Unknown Physician Specialty

## 2016-08-11 ENCOUNTER — Telehealth: Payer: Self-pay | Admitting: Unknown Physician Specialty

## 2016-08-11 VITALS — BP 121/86 | HR 121 | Temp 97.3°F | Ht 67.0 in | Wt 198.8 lb

## 2016-08-11 DIAGNOSIS — J00 Acute nasopharyngitis [common cold]: Secondary | ICD-10-CM

## 2016-08-11 DIAGNOSIS — R059 Cough, unspecified: Secondary | ICD-10-CM

## 2016-08-11 DIAGNOSIS — R05 Cough: Secondary | ICD-10-CM | POA: Diagnosis not present

## 2016-08-11 LAB — VERITOR FLU A/B WAIVED
INFLUENZA B: NEGATIVE
Influenza A: NEGATIVE

## 2016-08-11 NOTE — Progress Notes (Signed)
BP 121/86 (BP Location: Left Arm, Patient Position: Sitting, Cuff Size: Large)   Pulse (!) 121   Temp 97.3 F (36.3 C)   Ht 5\' 7"  (1.702 m)   Wt 198 lb 12.8 oz (90.2 kg)   LMP 07/27/2016 (Approximate)   SpO2 94%   BMI 31.14 kg/m    Subjective:    Patient ID: Ashley Kidd, female    DOB: 10-26-1980, 35 y.o.   MRN: [id number]  HPI: Ashley Kidd is a 35 y.o. female  Chief Complaint  Patient presents with  . URI    pt states she has a headache, chills, cough, congestion, fever, sore throat, ear ache, nausea, vomitting, diarrhea, and body aches. States symptoms started yesterdat morning.    URI   This is a new problem. The current episode started yesterday. The problem has been gradually worsening. Maximum temperature: around 100. The fever has been present for less than 1 day. Associated symptoms include coughing, diarrhea, ear pain, headaches, nausea, a sore throat and vomiting. Associated symptoms comments: myalgias. She has tried decongestant for the symptoms. The treatment provided no relief.    Relevant past medical, surgical, family and social history reviewed and updated as indicated. Interim medical history since our last visit reviewed. Allergies and medications reviewed and updated.  Review of Systems  HENT: Positive for ear pain and sore throat.   Respiratory: Positive for cough.   Gastrointestinal: Positive for diarrhea, nausea and vomiting.  Neurological: Positive for headaches.    Per HPI unless specifically indicated above     Objective:    BP 121/86 (BP Location: Left Arm, Patient Position: Sitting, Cuff Size: Large)   Pulse (!) 121   Temp 97.3 F (36.3 C)   Ht 5\' 7"  (1.702 m)   Wt 198 lb 12.8 oz (90.2 kg)   LMP 07/27/2016 (Approximate)   SpO2 94%   BMI 31.14 kg/m   Wt Readings from Last 3 Encounters:  08/11/16 198 lb 12.8 oz (90.2 kg)  08/04/16 198 lb 9.6 oz (90.1 kg)  06/16/16 199 lb (90.3 kg)    Physical Exam  Constitutional: She is  oriented to person, place, and time. She appears well-developed and well-nourished. No distress.  HENT:  Head: Normocephalic and atraumatic.  Right Ear: Tympanic membrane and ear canal normal.  Left Ear: Tympanic membrane and ear canal normal.  Nose: Rhinorrhea present. Right sinus exhibits no maxillary sinus tenderness and no frontal sinus tenderness. Left sinus exhibits no maxillary sinus tenderness and no frontal sinus tenderness.  Mouth/Throat: Mucous membranes are normal. Posterior oropharyngeal erythema present.  Eyes: Conjunctivae and lids are normal. Right eye exhibits no discharge. Left eye exhibits no discharge. No scleral icterus.  Cardiovascular: Normal rate and regular rhythm.   Pulmonary/Chest: Effort normal and breath sounds normal. No respiratory distress.  Abdominal: Normal appearance. There is no splenomegaly or hepatomegaly.  Musculoskeletal: Normal range of motion.  Neurological: She is alert and oriented to person, place, and time.  Skin: Skin is intact. No rash noted. No pallor.  Psychiatric: She has a normal mood and affect. Her behavior is normal. Judgment and thought content normal.    Results for orders placed or performed during the hospital encounter of 04/10/16  Lipase, blood  Result Value Ref Range   Lipase 22 11 - 51 U/L  Comprehensive metabolic panel  Result Value Ref Range   Sodium 137 135 - 145 mmol/L   Potassium 3.5 3.5 - 5.1 mmol/L   Chloride 107 101 -  111 mmol/L   CO2 22 22 - 32 mmol/L   Glucose, Bld 140 (H) 65 - 99 mg/dL   BUN 7 6 - 20 mg/dL   Creatinine, Ser 0.85 0.44 - 1.00 mg/dL   Calcium 8.6 (L) 8.9 - 10.3 mg/dL   Total Protein 7.2 6.5 - 8.1 g/dL   Albumin 3.8 3.5 - 5.0 g/dL   AST 21 15 - 41 U/L   ALT 23 14 - 54 U/L   Alkaline Phosphatase 104 38 - 126 U/L   Total Bilirubin 0.3 0.3 - 1.2 mg/dL   GFR calc non Af Amer >60 >60 mL/min   GFR calc Af Amer >60 >60 mL/min   Anion gap 8 5 - 15  CBC  Result Value Ref Range   WBC 15.8 (H) 3.6 -  11.0 K/uL   RBC 4.50 3.80 - 5.20 MIL/uL   Hemoglobin 11.8 (L) 12.0 - 16.0 g/dL   HCT 34.7 (L) 35.0 - 47.0 %   MCV 77.1 (L) 80.0 - 100.0 fL   MCH 26.1 26.0 - 34.0 pg   MCHC 33.8 32.0 - 36.0 g/dL   RDW 15.0 (H) 11.5 - 14.5 %   Platelets 623 (H) 150 - 440 K/uL  Urinalysis complete, with microscopic  Result Value Ref Range   Color, Urine STRAW (A) YELLOW   APPearance CLEAR (A) CLEAR   Glucose, UA NEGATIVE NEGATIVE mg/dL   Bilirubin Urine NEGATIVE NEGATIVE   Ketones, ur NEGATIVE NEGATIVE mg/dL   Specific Gravity, Urine 1.005 1.005 - 1.030   Hgb urine dipstick NEGATIVE NEGATIVE   pH 6.0 5.0 - 8.0   Protein, ur NEGATIVE NEGATIVE mg/dL   Nitrite NEGATIVE NEGATIVE   Leukocytes, UA NEGATIVE NEGATIVE   RBC / HPF 0-5 0 - 5 RBC/hpf   WBC, UA 0-5 0 - 5 WBC/hpf   Bacteria, UA MANY (A) NONE SEEN   Squamous Epithelial / LPF 0-5 (A) NONE SEEN   Mucous PRESENT   POC urine preg, ED  Result Value Ref Range   Preg Test, Ur Negative Negative      Assessment & Plan:   Problem List Items Addressed This Visit    None    Visit Diagnoses    Cough    -  Primary   Relevant Orders   Veritor Flu A/B Waived   Acute nasopharyngitis           Follow up plan: Return if symptoms worsen or fail to improve.

## 2016-08-11 NOTE — Telephone Encounter (Signed)
Routing to provider. Is it OK to type a letter?

## 2016-10-11 ENCOUNTER — Telehealth: Payer: Managed Care, Other (non HMO) | Admitting: Family

## 2016-10-11 DIAGNOSIS — R112 Nausea with vomiting, unspecified: Secondary | ICD-10-CM

## 2016-10-11 MED ORDER — PROMETHAZINE HCL 25 MG PO TABS: 25.0000 mg | ORAL_TABLET | Freq: Two times a day (BID) | ORAL | 0 refills | Status: DC | PRN

## 2016-10-11 NOTE — Progress Notes (Signed)
We are sorry that you are not feeling well. Here is how we plan to help!  Based on what you have shared with me it looks like you have a Virus that is irritating your GI tract.  Vomiting is the forceful emptying of a portion of the stomach's content through the mouth.  Although nausea and vomiting can make you feel miserable, it's important to remember that these are not diseases, but rather symptoms of an underlying illness.  When we treat short term symptoms, we always caution that any symptoms that persist should be fully evaluated in a medical office.  I have prescribed a medication that will help alleviate your symptoms and allow you to stay hydrated:  Promethazine 25 mg take 1 tablet twice daily as needed for nausea/vomiting  For your congestion, you may also add Mucinex, 1 tab 600mg  by mouth, twice daily.   HOME CARE:  Drink clear liquids.  This is very important! Dehydration (the lack of fluid) can lead to a serious complication.  Start off with 1 tablespoon every 5 minutes for 8 hours.  You may begin eating bland foods after 8 hours without vomiting.  Start with saltine crackers, white bread, rice, mashed potatoes, applesauce.  After 48 hours on a bland diet, you may resume a normal diet.  Try to go to sleep.  Sleep often empties the stomach and relieves the need to vomit.  GET HELP RIGHT AWAY IF:   Your symptoms do not improve or worsen within 2 days after treatment.  You have a fever for over 3 days.  You cannot keep down fluids after trying the medication.  MAKE SURE YOU:   Understand these instructions.  Will watch your condition.  Will get help right away if you are not doing well or get worse.   Thank you for choosing an e-visit. Your e-visit answers were reviewed by a board certified advanced clinical practitioner to complete your personal care plan. Depending upon the condition, your plan could have included both over the counter or prescription  medications. Please review your pharmacy choice. Be sure that the pharmacy you have chosen is open so that you can pick up your prescription now.  If there is a problem you may message your provider in [place] to have the prescription routed to another pharmacy. Your safety is important to [place]. If you have drug allergies check your prescription carefully.  For the next 24 hours, you can use MyChart to ask questions about today's visit, request a non-urgent call back, or ask for a work or school excuse from your e-visit provider. You will get an e-mail in the next two days asking about your experience. I hope that your e-visit has been valuable and will speed your recovery.

## 2016-10-12 ENCOUNTER — Encounter: Payer: Self-pay | Admitting: Family Medicine

## 2016-10-12 ENCOUNTER — Ambulatory Visit (INDEPENDENT_AMBULATORY_CARE_PROVIDER_SITE_OTHER): Payer: Managed Care, Other (non HMO) | Admitting: Family Medicine

## 2016-10-12 VITALS — BP 119/82 | HR 96 | Temp 98.3°F | Wt 203.0 lb

## 2016-10-12 DIAGNOSIS — R112 Nausea with vomiting, unspecified: Secondary | ICD-10-CM

## 2016-10-12 DIAGNOSIS — R197 Diarrhea, unspecified: Secondary | ICD-10-CM

## 2016-10-12 LAB — UA/M W/RFLX CULTURE, ROUTINE
Bilirubin, UA: NEGATIVE
Glucose, UA: NEGATIVE
Ketones, UA: NEGATIVE
Leukocytes, UA: NEGATIVE
Nitrite, UA: NEGATIVE
Protein, UA: NEGATIVE
RBC, UA: NEGATIVE
Specific Gravity, UA: 1.01 (ref 1.005–1.030)
Urobilinogen, Ur: 0.2 mg/dL (ref 0.2–1.0)
pH, UA: 6 (ref 5.0–7.5)

## 2016-10-12 MED ORDER — CYCLOBENZAPRINE HCL 10 MG PO TABS: 10.0000 mg | ORAL_TABLET | Freq: Three times a day (TID) | ORAL | 0 refills | Status: DC | PRN

## 2016-10-12 NOTE — Patient Instructions (Signed)
Follow up as needed

## 2016-10-12 NOTE — Progress Notes (Signed)
   BP 119/82   Pulse 96   Temp 98.3 F (36.8 C)   Wt 203 lb (92.1 kg)   LMP 09/21/2016 (Approximate)   SpO2 99%   BMI 31.79 kg/m    Subjective:    Patient ID: Ashley Kidd, female    DOB: [birth year], 36 y.o.   MRN: [id number]  HPI: Ashley Kidd is a 36 y.o. female  Chief Complaint  Patient presents with  . Urinary Tract Infection    last week had some nausea and back pain. This morning difficulty urinating, some burning, no odor, no increased frequency or urgency.    Nearly a week history of N/V/D, and back pain. States this morning started having difficulty urinating and some dysuria. Denies fever, chills, abdominal pain, hematuria, or frequency. Did an e-visit yesterday and was told she has a viral stomach bug. Got phenergan at e-visit but hasn't picked it up yet. Has been holding down liquids fine throughout, states she started this morning really pushing fluids once she started having the urinary sxs.   Relevant past medical, surgical, family and social history reviewed and updated as indicated. Interim medical history since our last visit reviewed. Allergies and medications reviewed and updated.  Review of Systems  HENT: Negative.   Eyes: Negative.   Respiratory: Negative.   Cardiovascular: Negative.   Gastrointestinal: Positive for diarrhea, nausea and vomiting. Negative for abdominal pain.  Genitourinary: Positive for difficulty urinating.  Musculoskeletal: Positive for back pain.  Neurological: Negative.   Psychiatric/Behavioral: Negative.     Per HPI unless specifically indicated above     Objective:    BP 119/82   Pulse 96   Temp 98.3 F (36.8 C)   Wt 203 lb (92.1 kg)   LMP 09/21/2016 (Approximate)   SpO2 99%   BMI 31.79 kg/m   Wt Readings from Last 3 Encounters:  10/12/16 203 lb (92.1 kg)  08/11/16 198 lb 12.8 oz (90.2 kg)  08/04/16 198 lb 9.6 oz (90.1 kg)    Physical Exam  Constitutional: She is oriented to person, place, and time. She  appears well-developed and well-nourished. No distress.  HENT:  Head: Atraumatic.  Eyes: Conjunctivae are normal. Pupils are equal, round, and reactive to light.  Neck: Normal range of motion. Neck supple.  Cardiovascular: Normal rate.   Pulmonary/Chest: Effort normal and breath sounds normal. No respiratory distress.  Abdominal: Soft. Bowel sounds are normal. She exhibits no distension. There is no tenderness.  Musculoskeletal: Normal range of motion. She exhibits tenderness (mild ttp over left mid-back).  Neurological: She is alert and oriented to person, place, and time.  Skin: Skin is warm and dry.  Psychiatric: She has a normal mood and affect. Her behavior is normal.  Nursing note and vitals reviewed.     Assessment & Plan:   Problem List Items Addressed This Visit    None    Visit Diagnoses    Nausea vomiting and diarrhea    -  Primary   Discussed starting phenergan and imodium, flexeril sent for prn use for back pain. Ibuprofen prn. Rest, push fluids. Monitor closely for changes   Relevant Orders   UA/M w/rflx Culture, Routine (STAT)    U/A negative. Strict return precautions given. If still having urinary sxs in a few days, will send new specimen for testing. Suspect dehydration from stomach virus causing her urinary symptoms.    Follow up plan: Return if symptoms worsen or fail to improve.

## 2016-10-25 ENCOUNTER — Ambulatory Visit
Admission: EM | Admit: 2016-10-25 | Discharge: 2016-10-25 | Disposition: A | Discharge status: Home / Self Care | Payer: Managed Care, Other (non HMO) | Attending: Family Medicine | Admitting: Family Medicine

## 2016-10-25 DIAGNOSIS — J40 Bronchitis, not specified as acute or chronic: Secondary | ICD-10-CM

## 2016-10-25 MED ORDER — BENZONATATE 100 MG PO CAPS: 100.0000 mg | ORAL_CAPSULE | Freq: Three times a day (TID) | ORAL | 0 refills | Status: DC | PRN

## 2016-10-25 MED ORDER — AZITHROMYCIN 250 MG PO TABS: ORAL_TABLET | ORAL | 0 refills | Status: DC

## 2016-10-25 MED ORDER — HYDROCOD POLST-CPM POLST ER 10-8 MG/5ML PO SUER: 5.0000 mL | Freq: Every evening | ORAL | 0 refills | Status: DC | PRN

## 2016-10-25 NOTE — ED Provider Notes (Signed)
[facility] ____________________________________________  Time seen: Approximately 3:00 PM  I have reviewed the triage vital signs and the nursing notes.   HISTORY  Chief Complaint Cough   HPI Ashley Kidd is a 36 y.o. female presents for complaints of 1 week of cough and congestion. Patient reports some nasal congestion, but reports primary cough. Reports occasional sore throat but denies consistent sore throat. States cough is occasionally productive first thing in the morning of greenish mucus, but reports otherwise dry hacking cough. Patient reports occasional wheeze noted. Patient reports she does have a history of asthma with intermittent wheezing when sick, that feels similar. Reports home albuterol inhaler resolved wheezing. Denies known fevers. Reports multiple sick contacts around her. Patient states cough intermittently wakes her up from sleep. Reports feels sore from coughing all over. Denies chest pain or chest pain with breathing or shortness of breath. Reports continues to drink fluids well, slight decrease in appetite. Reports symptoms of an unresolving over-the-counter cough and congestion medications.  Denies chest pain, chest pain with deep breath, shortness of breath, abdominal pain, dysuria, extremity pain, extremity swelling or rash. Denies recent sickness or immobilization. Denies recent antibiotic use.   Patient's last menstrual period was 10/18/2016. Denies pregnancy Kathrine Haddock, NP: PCP   Past Medical History:  Diagnosis Date  . Allergy   . Asthma   . GERD (gastroesophageal reflux disease)   . Migraine   . PCOS (polycystic ovarian syndrome)   . Sinus complaint     Patient Active Problem List   Diagnosis Date Noted  . C. difficile diarrhea 12/24/2015  . Migraine   . Allergy   . Asthma   . Low back pain 03/26/2015  . Chronic cholecystitis without calculus 05/19/2013    Past Surgical History:  Procedure Laterality Date  .  CHOLECYSTECTOMY  06-09-13  . OVARIAN CYST REMOVAL Bilateral 2002  . UPPER GI ENDOSCOPY  11-29-12   Dr Candace Cruise     No current facility-administered medications for this encounter.   Current Outpatient Prescriptions:  .  albuterol (PROVENTIL HFA;VENTOLIN HFA) 108 (90 Base) MCG/ACT inhaler, Inhale 2 puffs into the lungs every 6 (six) hours as needed for wheezing., Disp: 1 Inhaler, Rfl: 12 .  azithromycin (ZITHROMAX Z-PAK) 250 MG tablet, Take 2 tablets (500 mg) on  Day 1,  followed by 1 tablet (250 mg) once daily on Days 2 through 5., Disp: 6 each, Rfl: 0 .  benzonatate (TESSALON PERLES) 100 MG capsule, Take 1 capsule (100 mg total) by mouth 3 (three) times daily as needed for cough., Disp: 15 capsule, Rfl: 0 .  budesonide-formoterol (SYMBICORT) 160-4.5 MCG/ACT inhaler, Inhale 2 puffs into the lungs 2 (two) times daily., Disp: , Rfl:  .  cetirizine (ZYRTEC) 10 MG tablet, Take 10 mg by mouth daily., Disp: , Rfl:  .  chlorpheniramine-HYDROcodone (TUSSIONEX PENNKINETIC ER) 10-8 MG/5ML SUER, Take 5 mLs by mouth at bedtime as needed for cough. do not drive or operate machinery while taking as can cause drowsiness., Disp: 75 mL, Rfl: 0 .  citalopram (CELEXA) 20 MG tablet, Take 20 mg by mouth daily., Disp: , Rfl:  .  cyclobenzaprine (FLEXERIL) 10 MG tablet, Take 1 tablet (10 mg total) by mouth 3 (three) times daily as needed for muscle spasms., Disp: 30 tablet, Rfl: 0 .  fluticasone (FLONASE) 50 MCG/ACT nasal spray, SPRAY TWICE IN EACH NOSTRIL EVERY DAY, Disp: 16 g, Rfl: 11 .  metFORMIN (GLUCOPHAGE) 500 MG tablet, Take 2 tablets twice daily, Disp: , Rfl:  .  Vitamin D, Ergocalciferol, (DRISDOL) 50000 units CAPS capsule, Take 50,000 Units by mouth once a week., Disp: , Rfl: 3 .  ZOVIA 1/35E, 28, 1-35 MG-MCG tablet, Take by mouth daily., Disp: , Rfl:   Allergies Bee venom  Family History  Problem Relation Age of Onset  . Diabetes Mother   . Asthma Mother   . Diabetes Father   . Heart attack Father   .  Heart disease Father   . Asthma Sister   . Bipolar disorder Sister   . Asthma Brother   . Bipolar disorder Brother   . Epilepsy Brother     Social History Social History  Substance Use Topics  . Smoking status: Former Smoker    Years: 12.00    Quit date: 08/24/2012  . Smokeless tobacco: Never Used  . Alcohol use No    Review of Systems Constitutional: No fever/chills Eyes: No visual changes. ENT:As above. Cardiovascular: Denies chest pain. Respiratory: Denies shortness of breath. Gastrointestinal: No abdominal pain.  No nausea, no vomiting.  No diarrhea.  No constipation. Genitourinary: Negative for dysuria. Musculoskeletal: Negative for back pain. Skin: Negative for rash. Neurological: Negative for headaches, focal weakness or numbness.  10-point ROS otherwise negative.  ____________________________________________   PHYSICAL EXAM:  VITAL SIGNS: ED Triage Vitals  Enc Vitals Group     BP 10/25/16 1343 124/75     Pulse Rate 10/25/16 1343 90     Resp 10/25/16 1343 18     Temp 10/25/16 1343 98.1 F (36.7 C)     Temp Source 10/25/16 1343 Oral     SpO2 10/25/16 1343 100 %     Weight 10/25/16 1343 203 lb (92.1 kg)     Height 10/25/16 1343 5\' 7"  (1.702 m)     Head Circumference --      Peak Flow --      Pain Score 10/25/16 1346 3     Pain Loc --      Pain Edu? --      Excl. in [place]? --     Constitutional: Alert and oriented. Well appearing and in no acute distress. Eyes: Conjunctivae are normal. PERRL. EOMI. Head: Atraumatic. No sinus tenderness to palpation. No swelling. No erythema.  Ears: no erythema, normal TMs bilaterally.   Nose:Nasal congestion with clear rhinorrhea  Mouth/Throat: Mucous membranes are moist. No pharyngeal erythema. No tonsillar swelling or exudate.  Neck: No stridor.  No cervical spine tenderness to palpation. Hematological/Lymphatic/Immunilogical: No cervical lymphadenopathy. Cardiovascular: Normal rate, regular rhythm. Grossly normal  heart sounds.  Good peripheral circulation. Respiratory: Normal respiratory effort.  No retractions. No wheezes, rales or rhonchi. Good air movement. Dry intermittent cough noted in room with mild bronchospasm wheeze. Speaks in complete sentences. Gastrointestinal: Soft and nontender. No CVA tenderness. Musculoskeletal: Ambulatory with steady gait. No cervical, thoracic or lumbar tenderness to palpation. No lower extremity tenderness or swelling bilaterally. Bilateral pedal pulses equal and easily palpated. Neurologic:  Normal speech and language. No gait instability. Skin:  Skin appears warm, dry and intact. No rash noted. Psychiatric: Mood and affect are normal. Speech and behavior are normal. ___________________________________________   LABS (all labs ordered are listed, but only abnormal results are displayed)  Labs Reviewed - No data to display ____________________________________________   PROCEDURES Procedures   INITIAL IMPRESSION / ASSESSMENT AND PLAN / ED COURSE  Pertinent labs & imaging results that were available during my care of the patient were reviewed by me and considered in my medical decision making (see chart for  details).  Well-appearing patient. No acute distress. Suspect bronchitis. Discussed with patient evaluation treatment options. Reports has taken azithromycin in past and tolerated well. Will treat patient with oral azithromycin, when necessary Tessalon Perles and when necessary Tussionex. Continue home albuterol inhaler as needed. Discussed strict follow-up and return parameters.Discussed indication, risks and benefits of medications with patient.  Discussed follow up with Primary care physician this week. Discussed follow up and return parameters including no resolution or any worsening concerns. Patient verbalized understanding and agreed to plan.   ____________________________________________   FINAL CLINICAL IMPRESSION(S) / ED DIAGNOSES  Final  diagnoses:  Bronchitis     Discharge Medication List as of 10/25/2016  3:21 PM    START taking these medications   Details  azithromycin (ZITHROMAX Z-PAK) 250 MG tablet Take 2 tablets (500 mg) on  Day 1,  followed by 1 tablet (250 mg) once daily on Days 2 through 5., Normal    benzonatate (TESSALON PERLES) 100 MG capsule Take 1 capsule (100 mg total) by mouth 3 (three) times daily as needed for cough., Starting Sun 10/25/2016, Normal    chlorpheniramine-HYDROcodone (TUSSIONEX PENNKINETIC ER) 10-8 MG/5ML SUER Take 5 mLs by mouth at bedtime as needed for cough. do not drive or operate machinery while taking as can cause drowsiness., Starting Sun 10/25/2016, Print        Note: This dictation was prepared with Dragon dictation along with smaller phrase technology. Any transcriptional errors that result from this process are unintentional.        Marylene Land, NP 10/25/16 [place], NP 10/25/16 1717

## 2016-10-25 NOTE — ED Triage Notes (Signed)
Pt with one week of cough of green phlegm, worse in the mornings. Denies fever. Chest is sore from coughing. Throat and ears are sore. Pain 3/10

## 2016-10-25 NOTE — Discharge Instructions (Signed)
Take medication as prescribed. Use home albuterol as needed for wheezing. Rest. Drink plenty of fluids.   Follow up with your primary care physician this week as needed. Return to Urgent care for new or worsening concerns.

## 2016-11-10 ENCOUNTER — Encounter: Payer: Self-pay | Admitting: Unknown Physician Specialty

## 2016-11-10 ENCOUNTER — Ambulatory Visit (INDEPENDENT_AMBULATORY_CARE_PROVIDER_SITE_OTHER): Payer: Managed Care, Other (non HMO) | Admitting: Unknown Physician Specialty

## 2016-11-10 VITALS — BP 139/81 | HR 96 | Temp 98.6°F | Wt 203.6 lb

## 2016-11-10 DIAGNOSIS — J029 Acute pharyngitis, unspecified: Secondary | ICD-10-CM | POA: Diagnosis not present

## 2016-11-10 DIAGNOSIS — J069 Acute upper respiratory infection, unspecified: Secondary | ICD-10-CM | POA: Diagnosis not present

## 2016-11-10 DIAGNOSIS — B9789 Other viral agents as the cause of diseases classified elsewhere: Secondary | ICD-10-CM

## 2016-11-10 NOTE — Progress Notes (Signed)
   BP 139/81 (BP Location: Left Arm, Patient Position: Sitting, Cuff Size: Normal)   Pulse 96   Temp 98.6 F (37 C)   Wt 203 lb 9.6 oz (92.4 kg)   LMP 10/18/2016   SpO2 97%   BMI 31.89 kg/m    Subjective:    Patient ID: Ashley Kidd, female    DOB: 1980/10/17, 36 y.o.   MRN: [id number]  HPI: Ashley Kidd is a 36 y.o. female  Chief Complaint  Patient presents with  . URI    pt states she has had a sore throat, facial pain, and ear pain. States symptoms started yesterday.     Relevant past medical, surgical, family and social history reviewed and updated as indicated. Interim medical history since our last visit reviewed. Allergies and medications reviewed and updated.  Review of Systems  Per HPI unless specifically indicated above     Objective:    BP 139/81 (BP Location: Left Arm, Patient Position: Sitting, Cuff Size: Normal)   Pulse 96   Temp 98.6 F (37 C)   Wt 203 lb 9.6 oz (92.4 kg)   LMP 10/18/2016   SpO2 97%   BMI 31.89 kg/m   Wt Readings from Last 3 Encounters:  11/10/16 203 lb 9.6 oz (92.4 kg)  10/25/16 203 lb (92.1 kg)  10/12/16 203 lb (92.1 kg)    Physical Exam  Constitutional: She is oriented to person, place, and time. She appears well-developed and well-nourished. No distress.  HENT:  Head: Normocephalic and atraumatic.  Right Ear: Tympanic membrane and ear canal normal.  Left Ear: Tympanic membrane and ear canal normal.  Nose: Rhinorrhea present. Right sinus exhibits no maxillary sinus tenderness and no frontal sinus tenderness. Left sinus exhibits no maxillary sinus tenderness and no frontal sinus tenderness.  Mouth/Throat: Mucous membranes are normal. Posterior oropharyngeal erythema present.  Eyes: Conjunctivae and lids are normal. Right eye exhibits no discharge. Left eye exhibits no discharge. No scleral icterus.  Cardiovascular: Normal rate and regular rhythm.   Pulmonary/Chest: Effort normal and breath sounds normal. No respiratory  distress.  Abdominal: Normal appearance. There is no splenomegaly or hepatomegaly.  Musculoskeletal: Normal range of motion.  Neurological: She is alert and oriented to person, place, and time.  Skin: Skin is intact. No rash noted. No pallor.  Psychiatric: She has a normal mood and affect. Her behavior is normal. Judgment and thought content normal.       Assessment & Plan:   Problem List Items Addressed This Visit    None    Visit Diagnoses    Sore throat    -  Primary   Relevant Orders   Rapid strep screen (not at [facility])   Veritor Flu A/B Waived   Viral upper respiratory tract infection       Supportive care       Follow up plan: Return if symptoms worsen or fail to improve.

## 2016-11-11 LAB — VERITOR FLU A/B WAIVED
Influenza A: NEGATIVE
Influenza B: NEGATIVE

## 2016-11-12 LAB — CULTURE, GROUP A STREP: STREP A CULTURE: NEGATIVE

## 2016-11-12 LAB — RAPID STREP SCREEN (MED CTR MEBANE ONLY): Strep Gp A Ag, IA W/Reflex: NEGATIVE

## 2017-04-19 ENCOUNTER — Encounter: Payer: Self-pay | Admitting: Unknown Physician Specialty

## 2017-04-19 ENCOUNTER — Ambulatory Visit (INDEPENDENT_AMBULATORY_CARE_PROVIDER_SITE_OTHER): Payer: Managed Care, Other (non HMO) | Admitting: Unknown Physician Specialty

## 2017-04-19 VITALS — BP 124/84 | HR 98 | Temp 98.0°F | Wt 212.4 lb

## 2017-04-19 DIAGNOSIS — L509 Urticaria, unspecified: Secondary | ICD-10-CM

## 2017-04-19 MED ORDER — BETAMETHASONE DIPROPIONATE AUG 0.05 % EX CREA: TOPICAL_CREAM | Freq: Two times a day (BID) | CUTANEOUS | 0 refills | Status: DC

## 2017-04-19 MED ORDER — PREDNISONE 10 MG PO TABS: ORAL_TABLET | ORAL | 0 refills | Status: DC

## 2017-04-19 NOTE — Progress Notes (Signed)
BP 124/84   Pulse 98   Temp 98 F (36.7 C)   Wt 212 lb 6.4 oz (96.3 kg)   LMP 03/29/2017 (Approximate)   SpO2 97%   BMI 33.27 kg/m    Subjective:    Patient ID: Ashley Kidd, female    DOB: 10-24-1980, 36 y.o.   MRN: [id number]  HPI: Ashley Kidd is a 36 y.o. female  Chief Complaint  Patient presents with  . Rash    pt states that she woke up with a rash around her neck this morning, states it does not itch but is painful   Rash  This is a new problem. The current episode started today. The problem has been gradually worsening since onset. The affected locations include the neck. The rash is characterized by burning. She was exposed to nothing (Did have to work all weekend). Associated symptoms include fatigue. Pertinent negatives include no anorexia, congestion, cough, diarrhea, eye pain, fever, joint pain, nail changes, rhinorrhea, shortness of breath, sore throat or vomiting. Past treatments include nothing.    Relevant past medical, surgical, family and social history reviewed and updated as indicated. Interim medical history since our last visit reviewed. Allergies and medications reviewed and updated.  Review of Systems  Constitutional: Positive for fatigue. Negative for fever.  HENT: Negative for congestion, rhinorrhea and sore throat.   Eyes: Negative for pain.  Respiratory: Negative for cough and shortness of breath.   Gastrointestinal: Negative for anorexia, diarrhea and vomiting.  Musculoskeletal: Negative for joint pain.  Skin: Positive for rash. Negative for nail changes.    Per HPI unless specifically indicated above     Objective:    BP 124/84   Pulse 98   Temp 98 F (36.7 C)   Wt 212 lb 6.4 oz (96.3 kg)   LMP 03/29/2017 (Approximate)   SpO2 97%   BMI 33.27 kg/m   Wt Readings from Last 3 Encounters:  04/19/17 212 lb 6.4 oz (96.3 kg)  11/10/16 203 lb 9.6 oz (92.4 kg)  10/25/16 203 lb (92.1 kg)    Physical Exam  Constitutional: She is  oriented to person, place, and time. She appears well-developed and well-nourished. No distress.  HENT:  Head: Normocephalic and atraumatic.  Eyes: Conjunctivae and lids are normal. Right eye exhibits no discharge. Left eye exhibits no discharge. No scleral icterus.  Neck: Normal range of motion. Neck supple. No JVD present. Carotid bruit is not present.  Cardiovascular: Normal rate, regular rhythm and normal heart sounds.   Pulmonary/Chest: Effort normal and breath sounds normal.  Abdominal: Normal appearance. There is no splenomegaly or hepatomegaly.  Musculoskeletal: Normal range of motion.  Neurological: She is alert and oriented to person, place, and time.  Skin: Skin is warm, dry and intact. No pallor.  Erythemetous and inflamed rash around neck.    Psychiatric: She has a normal mood and affect. Her behavior is normal. Judgment and thought content normal.    Results for orders placed or performed in visit on 11/10/16  Rapid strep screen (not at [facility])  Result Value Ref Range   Strep Gp A Ag, IA W/Reflex Negative Negative  Veritor Flu A/B Waived  Result Value Ref Range   Influenza A Negative Negative   Influenza B Negative Negative  Culture, Group A Strep  Result Value Ref Range   Strep A Culture Negative       Assessment & Plan:   Problem List Items Addressed This Visit    None  Visit Diagnoses    Urticaria    -  Primary   Continue Zyrtec.  Add diprolene cream to rash. Prednisone taper       Follow up plan: Return if symptoms worsen or fail to improve.

## 2017-06-15 ENCOUNTER — Other Ambulatory Visit: Payer: Self-pay | Admitting: Family Medicine

## 2017-06-15 NOTE — Telephone Encounter (Signed)
Routing to provider. No follow up on file. 

## 2017-08-07 ENCOUNTER — Telehealth: Payer: No Typology Code available for payment source | Admitting: Nurse Practitioner

## 2017-08-07 DIAGNOSIS — R05 Cough: Secondary | ICD-10-CM

## 2017-08-07 DIAGNOSIS — R059 Cough, unspecified: Secondary | ICD-10-CM

## 2017-08-07 MED ORDER — AZITHROMYCIN 250 MG PO TABS: ORAL_TABLET | ORAL | 0 refills | Status: DC

## 2017-08-07 MED ORDER — BENZONATATE 100 MG PO CAPS: 100.0000 mg | ORAL_CAPSULE | Freq: Three times a day (TID) | ORAL | 0 refills | Status: DC | PRN

## 2017-08-07 MED ORDER — PREDNISONE 10 MG (21) PO TBPK: ORAL_TABLET | ORAL | 0 refills | Status: DC

## 2017-08-07 NOTE — Progress Notes (Signed)
We are sorry that you are not feeling well.  Here is how we plan to help!  Based on your presentation I believe you most likely have A cough due to bacteria.  When patients have a fever and a productive cough with a change in color or increased sputum production, we are concerned about bacterial bronchitis.  If left untreated it can progress to pneumonia.  If your symptoms do not improve with your treatment plan it is important that you contact your provider.   I have prescribed Azithromyin 250 mg: two tablets now and then one tablet daily for 4 additonal days    In addition you may use A prescription cough medication called Tessalon Perles 100mg . You may take 1-2 capsules every 8 hours as needed for your cough. Cannot rx any stronger cough meds in an evisit.  Sterapred 10 mg dosepak    * If you continue with sob on exertion need to see PCP  From your responses in the eVisit questionnaire you describe inflammation in the upper respiratory tract which is causing a significant cough.  This is commonly called Bronchitis and has four common causes:    Allergies  Viral Infections  Acid Reflux  Bacterial Infection Allergies, viruses and acid reflux are treated by controlling symptoms or eliminating the cause. An example might be a cough caused by taking certain blood pressure medications. You stop the cough by changing the medication. Another example might be a cough caused by acid reflux. Controlling the reflux helps control the cough.  USE OF BRONCHODILATOR ("RESCUE") INHALERS: There is a risk from using your bronchodilator too frequently.  The risk is that over-reliance on a medication which only relaxes the muscles surrounding the breathing tubes can reduce the effectiveness of medications prescribed to reduce swelling and congestion of the tubes themselves.  Although you feel brief relief from the bronchodilator inhaler, your asthma may actually be worsening with the tubes becoming more swollen  and filled with mucus.  This can delay other crucial treatments, such as oral steroid medications. If you need to use a bronchodilator inhaler daily, several times per day, you should discuss this with your provider.  There are probably better treatments that could be used to keep your asthma under control.     HOME CARE . Only take medications as instructed by your medical team. . Complete the entire course of an antibiotic. . Drink plenty of fluids and get plenty of rest. . Avoid close contacts especially the very young and the elderly . Cover your mouth if you cough or cough into your sleeve. . Always remember to wash your hands . A steam or ultrasonic humidifier can help congestion.   GET HELP RIGHT AWAY IF: . You develop worsening fever. . You become short of breath . You cough up blood. . Your symptoms persist after you have completed your treatment plan MAKE SURE YOU   Understand these instructions.  Will watch your condition.  Will get help right away if you are not doing well or get worse.  Your e-visit answers were reviewed by a board certified advanced clinical practitioner to complete your personal care plan.  Depending on the condition, your plan could have included both over the counter or prescription medications. If there is a problem please reply  once you have received a response from your provider. Your safety is important to [place].  If you have drug allergies check your prescription carefully.    You can use MyChart to ask  questions about today's visit, request a non-urgent call back, or ask for a work or school excuse for 24 hours related to this e-Visit. If it has been greater than 24 hours you will need to follow up with your provider, or enter a new e-Visit to address those concerns. You will get an e-mail in the next two days asking about your experience.  I hope that your e-visit has been valuable and will speed your recovery. Thank you for using e-visits.

## 2017-08-20 ENCOUNTER — Other Ambulatory Visit: Payer: Self-pay | Admitting: Family Medicine

## 2017-08-23 ENCOUNTER — Ambulatory Visit
Admission: EM | Admit: 2017-08-23 | Discharge: 2017-08-23 | Disposition: A | Discharge status: Home / Self Care | Payer: Managed Care, Other (non HMO) | Attending: Family Medicine | Admitting: Family Medicine

## 2017-08-23 ENCOUNTER — Encounter: Payer: Self-pay | Admitting: *Deleted

## 2017-08-23 DIAGNOSIS — R112 Nausea with vomiting, unspecified: Secondary | ICD-10-CM

## 2017-08-23 DIAGNOSIS — R197 Diarrhea, unspecified: Secondary | ICD-10-CM

## 2017-08-23 MED ORDER — ONDANSETRON 8 MG PO TBDP: 8.0000 mg | ORAL_TABLET | Freq: Two times a day (BID) | ORAL | 0 refills | Status: DC

## 2017-08-23 NOTE — Discharge Instructions (Signed)
Drink Plenty of fluids

## 2017-08-23 NOTE — ED Triage Notes (Signed)
N/V/D x4 days.

## 2017-08-23 NOTE — ED Provider Notes (Signed)
[facility]    CSN: [id number] Arrival date & time: 08/23/17  1603     History   Chief Complaint Chief Complaint  Patient presents with  . Nausea  . Emesis  . Diarrhea    HPI 36 year old female who presents with 4 day history of nausea vomiting and diarrhea.  Says nausea and vomiting are her worst symptoms at the present time.  There is no blood or mucus in the vomitus nor in the diarrhea.  He is having 5 episodes of vomiting daily.  Worse in the evening.  Started after Thanksgiving dinner.  Nobody else has been sick with the same symptoms.  Patient does have a history of recurrent episodes of nausea vomiting and diarrhea as referenced in her medical records.  States that she is taking in fluids and is eating light meals of crackers etc.  Had no fever or chills.  No abdominal Pain except for gas type pains.           Ashley Kidd is a 36 y.o. female.   HPI  Past Medical History:  Diagnosis Date  . Allergy   . Asthma   . Diabetes mellitus without complication ([place])   . GERD (gastroesophageal reflux disease)   . Migraine   . PCOS (polycystic ovarian syndrome)   . Sinus complaint     Patient Active Problem List   Diagnosis Date Noted  . C. difficile diarrhea 12/24/2015  . Migraine   . Allergy   . Asthma   . Low back pain 03/26/2015  . Chronic cholecystitis without calculus 05/19/2013    Past Surgical History:  Procedure Laterality Date  . CHOLECYSTECTOMY  06-09-13  . OVARIAN CYST REMOVAL Bilateral 2002  . UPPER GI ENDOSCOPY  11-29-12   Dr Candace Cruise    OB History    Gravida Para Term Preterm AB Living   0 0 0 0 0 0   SAB TAB Ectopic Multiple Live Births   0 0 0 0        Obstetric Comments   1st Menstrual Cycle: 13 1st Pregnancy:  NA       Home Medications    Prior to Admission medications   Medication Sig Start Date End Date Taking? Authorizing Provider  aspirin 81 MG tablet Take 81 mg by mouth daily.   Yes [provider]    cetirizine (ZYRTEC) 10 MG tablet Take 10 mg by mouth daily.   Yes [provider]  citalopram (CELEXA) 20 MG tablet Take 20 mg by mouth daily.   Yes [provider]  fluticasone (FLONASE) 50 MCG/ACT nasal spray SPRAY TWICE IN EACH NOSTRIL EVERY DAY. 06/16/17  Yes Volney American, PA-C  metFORMIN (GLUCOPHAGE) 500 MG tablet Take 2 tablets twice daily 07/13/16  Yes [provider]  omeprazole (PRILOSEC) 20 MG capsule Take 20 mg by mouth daily.   Yes [provider]  VENTOLIN HFA 108 (90 Base) MCG/ACT inhaler INHALE 2 PUFFS INTO THE LUNGS EVERY 6 HOURS AS NEEDED FOR WHEEZING 08/23/17  Yes Volney American, PA-C  ZOVIA 1/35E, 28, 1-35 MG-MCG tablet Take by mouth daily. 05/03/16  Yes [provider]  diphenhydrAMINE (BENADRYL) 25 mg capsule Take 25 mg by mouth every 6 (six) hours as needed for itching.    [provider]  ondansetron (ZOFRAN ODT) 8 MG disintegrating tablet Take 1 tablet (8 mg total) by mouth 2 (two) times daily. 08/23/17   Lorin Picket, PA-C    Family History  Family History  Problem Relation Age of Onset  . Diabetes Mother   . Asthma Mother   . Diabetes Father   . Heart attack Father   . Heart disease Father   . Asthma Sister   . Bipolar disorder Sister   . Asthma Brother   . Bipolar disorder Brother   . Epilepsy Brother     Social History Social History   Tobacco Use  . Smoking status: Former Smoker    Years: 12.00    Last attempt to quit: 08/24/2012    Years since quitting: 5.0  . Smokeless tobacco: Never Used  Substance Use Topics  . Alcohol use: No    Alcohol/week: 0.0 oz  . Drug use: No     Allergies   Bee venom   Review of Systems Review of Systems  Constitutional: Positive for activity change and appetite change. Negative for chills, fatigue and fever.  Gastrointestinal: Positive for diarrhea, nausea and vomiting. Negative for abdominal distention, abdominal pain, anal bleeding,  blood in stool and constipation.  All other systems reviewed and are negative.    Physical Exam Triage Vital Signs ED Triage Vitals  Enc Vitals Group     BP 08/23/17 1614 (!) 145/92     Pulse Rate 08/23/17 1614 88     Resp 08/23/17 1614 16     Temp 08/23/17 1614 98.7 F (37.1 C)     Temp Source 08/23/17 1614 Oral     SpO2 08/23/17 1614 100 %     Weight 08/23/17 1616 208 lb (94.3 kg)     Height 08/23/17 1616 5\' 7"  (1.702 m)     Head Circumference --      Peak Flow --      Pain Score --      Pain Loc --      Pain Edu? --      Excl. in [place]? --    No data found.  Updated Vital Signs BP (!) 145/92 (BP Location: Left Arm)   Pulse 88   Temp 98.7 F (37.1 C) (Oral)   Resp 16   Ht 5\' 7"  (1.702 m)   Wt 208 lb (94.3 kg)   SpO2 100%   BMI 32.58 kg/m   Visual Acuity Right Eye Distance:   Left Eye Distance:   Bilateral Distance:    Right Eye Near:   Left Eye Near:    Bilateral Near:     Physical Exam  Constitutional: She is oriented to person, place, and time. She appears well-developed and well-nourished. No distress.  HENT:  Head: Normocephalic.  Right Ear: External ear normal.  Left Ear: External ear normal.  Nose: Nose normal.  Mouth/Throat: Oropharynx is clear and moist. No oropharyngeal exudate.  Eyes: Pupils are equal, round, and reactive to light. Right eye exhibits no discharge. Left eye exhibits no discharge.  Neck: Normal range of motion.  Pulmonary/Chest: Effort normal and breath sounds normal.  Abdominal: Soft. Bowel sounds are normal. She exhibits no distension. There is no tenderness. There is no rebound and no guarding.  Musculoskeletal: Normal range of motion.  Neurological: She is alert and oriented to person, place, and time.  Skin: Skin is warm and dry. She is not diaphoretic.  Psychiatric: She has a normal mood and affect. Her behavior is normal. Judgment and thought content normal.  Nursing note and vitals reviewed.    UC Treatments / Results   Labs (all labs ordered are listed, but only abnormal results are displayed) Labs  Reviewed - No data to display  EKG  EKG Interpretation None       Radiology No results found.  Procedures Procedures (including critical care time)  Medications Ordered in UC Medications - No data to display   Initial Impression / Assessment and Plan / UC Course  I have reviewed the triage vital signs and the nursing notes.  Pertinent labs & imaging results that were available during my care of the patient were reviewed by me and considered in my medical decision making (see chart for details).     Plan: 1. Test/x-ray results and diagnosis reviewed with patient 2. rx as per orders; risks, benefits, potential side effects reviewed with patient 3. Recommend supportive treatment with continued hydration.  Zofran for the nausea.  She is not improving in several days she should follow-up with her primary care physician return to our clinic. 4. F/u prn if symptoms worsen or don't improve   Final Clinical Impressions(s) / UC Diagnoses   Final diagnoses:  Nausea vomiting and diarrhea    ED Discharge Orders        Ordered    ondansetron (ZOFRAN ODT) 8 MG disintegrating tablet  2 times daily     08/23/17 1634       Controlled Substance Prescriptions [place] Controlled Substance Registry consulted? Not Applicable   Lorin Picket, PA-C 08/23/17 1704

## 2017-09-19 ENCOUNTER — Telehealth: Payer: Managed Care, Other (non HMO) | Admitting: Physician Assistant

## 2017-09-19 DIAGNOSIS — J019 Acute sinusitis, unspecified: Secondary | ICD-10-CM

## 2017-09-19 DIAGNOSIS — B9689 Other specified bacterial agents as the cause of diseases classified elsewhere: Secondary | ICD-10-CM

## 2017-09-19 MED ORDER — AMOXICILLIN-POT CLAVULANATE 875-125 MG PO TABS: 1.0000 | ORAL_TABLET | Freq: Two times a day (BID) | ORAL | 0 refills | Status: DC

## 2017-09-19 NOTE — Progress Notes (Signed)

## 2017-10-26 ENCOUNTER — Encounter: Payer: Self-pay | Admitting: Emergency Medicine

## 2017-10-26 ENCOUNTER — Other Ambulatory Visit: Payer: Self-pay

## 2017-10-26 ENCOUNTER — Ambulatory Visit
Admission: EM | Admit: 2017-10-26 | Discharge: 2017-10-26 | Disposition: A | Discharge status: Home / Self Care | Payer: Managed Care, Other (non HMO) | Attending: Emergency Medicine | Admitting: Emergency Medicine

## 2017-10-26 DIAGNOSIS — M26621 Arthralgia of right temporomandibular joint: Secondary | ICD-10-CM | POA: Diagnosis not present

## 2017-10-26 DIAGNOSIS — G43009 Migraine without aura, not intractable, without status migrainosus: Secondary | ICD-10-CM

## 2017-10-26 MED ORDER — BUTALBITAL-APAP-CAFFEINE 50-325-40 MG PO TABS: 1.0000 | ORAL_TABLET | ORAL | 0 refills | Status: DC | PRN

## 2017-10-26 MED ORDER — IBUPROFEN 600 MG PO TABS: 600.0000 mg | ORAL_TABLET | Freq: Four times a day (QID) | ORAL | 0 refills | Status: DC | PRN

## 2017-10-26 MED ORDER — SUMATRIPTAN SUCCINATE 100 MG PO TABS: 100.0000 mg | ORAL_TABLET | ORAL | 0 refills | Status: DC | PRN

## 2017-10-26 MED ORDER — SUMATRIPTAN SUCCINATE 6 MG/0.5ML ~~LOC~~ SOLN
6.0000 mg | Freq: Once | SUBCUTANEOUS | Status: AC
  Administered 2017-10-26: 6 mg via SUBCUTANEOUS

## 2017-10-26 MED ORDER — KETOROLAC TROMETHAMINE 60 MG/2ML IM SOLN
30.0000 mg | Freq: Once | INTRAMUSCULAR | Status: AC
  Administered 2017-10-26: 30 mg via INTRAMUSCULAR

## 2017-10-26 MED ORDER — CYCLOBENZAPRINE HCL 10 MG PO TABS: 10.0000 mg | ORAL_TABLET | Freq: Every day | ORAL | 0 refills | Status: DC

## 2017-10-26 MED ORDER — ACETAMINOPHEN 500 MG PO TABS
1000.0000 mg | ORAL_TABLET | Freq: Once | ORAL | Status: AC
  Administered 2017-10-26: 1000 mg via ORAL

## 2017-10-26 MED ORDER — DEXAMETHASONE SODIUM PHOSPHATE 10 MG/ML IJ SOLN
10.0000 mg | Freq: Once | INTRAMUSCULAR | Status: AC
  Administered 2017-10-26: 10 mg via INTRAMUSCULAR

## 2017-10-26 NOTE — ED Triage Notes (Signed)
Patient c/o headache for the past 3 days.  Patient states that she is out of her Imitrex.

## 2017-10-26 NOTE — ED Provider Notes (Signed)
HPI  SUBJECTIVE:  Ashley Kidd is a 37 y.o. female who reports gradual onset frontal right-sided headache starting 3 days ago.  States it was intermittent, lasting hours and has now become constant today.  She also reports right jaw pain.  She reports nausea, intermittent photophobia, phonophobia and intermittent right ear pain.  No change in hearing, otorrhea. No fevers, vomiting, visual changes, nasal congestion, sinus pain/pressure, ear pain,  purulent nasal d/c, dental pain,  dysarthria, focal weakness, facial droop, discoordination. No body aches, neck stiffness, rash. No mental status changes, seizures, syncope.  Pt is not pregnant. No h/o HIV. No sudden onset, did not occur during exertion. States is similar to previous migraines- but lasting longer. Has tried IBU 600 mg, ice pack, warm compress. Last dose IBU w/in 6-8 hrs evaluation. Sx slightly better with IBU, worse with loud noise, bright lights, tensing up. Past medical history of borderline DM, migraines, TMJ arthralgia, GERD, thrombocytosis, PCOS, sinusutis. States that this feels like a migraine, but also thinks that it might be her TMJ.  States that she grinds her teeth at night, has a device at home to help prevent this.  States it does not feel like previous sinus infections. No imitrex at home. Past Medical history negative for aneurysm, HIV, glaucoma, stroke, atrial fibrillation or temporal arteritis. Pt is on baby ASA daily, otherwise not on any antiplatelets/anticoagulants. FH positive strokes MGM 40's, PGM in  70's .  LMP: 2 weeks. Denies possibility of being pregnant PMD: Kathrine Haddock, NP     Past Medical History:  Diagnosis Date  . Allergy   . Asthma   . Diabetes mellitus without complication ([place])   . GERD (gastroesophageal reflux disease)   . Migraine   . PCOS (polycystic ovarian syndrome)   . Sinus complaint     Past Surgical History:  Procedure Laterality Date  . CHOLECYSTECTOMY  06-09-13  . OVARIAN CYST  REMOVAL Bilateral 2002  . UPPER GI ENDOSCOPY  11-29-12   Dr Candace Cruise    Family History  Problem Relation Age of Onset  . Diabetes Mother   . Asthma Mother   . Diabetes Father   . Heart attack Father   . Heart disease Father   . Asthma Sister   . Bipolar disorder Sister   . Asthma Brother   . Bipolar disorder Brother   . Epilepsy Brother     Social History   Tobacco Use  . Smoking status: Former Smoker    Years: 12.00    Last attempt to quit: 08/24/2012    Years since quitting: 5.1  . Smokeless tobacco: Never Used  Substance Use Topics  . Alcohol use: No    Alcohol/week: 0.0 oz  . Drug use: No    No current facility-administered medications for this encounter.   Current Outpatient Medications:  .  aspirin 81 MG tablet, Take 81 mg by mouth daily., Disp: , Rfl:  .  cetirizine (ZYRTEC) 10 MG tablet, Take 10 mg by mouth daily., Disp: , Rfl:  .  citalopram (CELEXA) 20 MG tablet, Take 20 mg by mouth daily., Disp: , Rfl:  .  fluticasone (FLONASE) 50 MCG/ACT nasal spray, SPRAY TWICE IN EACH NOSTRIL EVERY DAY., Disp: 16 g, Rfl: 6 .  omeprazole (PRILOSEC) 20 MG capsule, Take 20 mg by mouth daily., Disp: , Rfl:  .  ondansetron (ZOFRAN ODT) 8 MG disintegrating tablet, Take 1 tablet (8 mg total) by mouth 2 (two) times daily., Disp: 6 tablet, Rfl: 0 .  VENTOLIN HFA 108 (90 Base) MCG/ACT inhaler, INHALE 2 PUFFS INTO THE LUNGS EVERY 6 HOURS AS NEEDED FOR WHEEZING, Disp: 18 g, Rfl: 0 .  ZOVIA 1/35E, 28, 1-35 MG-MCG tablet, Take by mouth daily., Disp: , Rfl:  .  butalbital-acetaminophen-caffeine (FIORICET, ESGIC) 50-325-40 MG tablet, Take 1-2 tablets by mouth every 4 (four) hours as needed for headache. Max 6 caps/day, Disp: 20 tablet, Rfl: 0 .  cyclobenzaprine (FLEXERIL) 10 MG tablet, Take 1 tablet (10 mg total) by mouth at bedtime., Disp: 20 tablet, Rfl: 0 .  diphenhydrAMINE (BENADRYL) 25 mg capsule, Take 25 mg by mouth every 6 (six) hours as needed for itching., Disp: , Rfl:  .  ibuprofen  (ADVIL,MOTRIN) 600 MG tablet, Take 1 tablet (600 mg total) by mouth every 6 (six) hours as needed., Disp: 30 tablet, Rfl: 0 .  metFORMIN (GLUCOPHAGE) 500 MG tablet, Take 2 tablets twice daily, Disp: , Rfl:  .  SUMAtriptan (IMITREX) 100 MG tablet, Take 1 tablet (100 mg total) by mouth every 2 (two) hours as needed for migraine. One tab at onset and may repeat x1 in 1 hour. Max 200 mg/24 hours, Disp: 10 tablet, Rfl: 0  Allergies  Allergen Reactions  . Bee Venom Hives     ROS  As noted in HPI.   Physical Exam  BP (!) 141/82 (BP Location: Left Arm)   Pulse 86   Temp 98.2 F (36.8 C) (Oral)   Resp 14   Ht 5\' 7"  (1.702 m)   Wt 208 lb (94.3 kg)   LMP 10/12/2017 (Approximate)   SpO2 98%   BMI 32.58 kg/m   Constitutional: Well developed, well nourished, no acute distress Eyes: PERRL, EOMI, conjunctiva normal bilaterally. Fundoscopic normal b/l. HENT: Normocephalic, atraumatic,mucus membranes moist, normal dentition.  TM normal b/l. + R TMJ tenderness, no crepitus. Normal dentition. No nasal congestion, mild R frontal sinus tenderness. . No temporal artery tenderness.  Neck: no cervical LN - trapezial muscle tenderness. No meningismus Respiratory: normal inspiratory effort Cardiovascular: Normal rate GI:  nondistended skin: No rash, skin intact Musculoskeletal: No edema, no tenderness, no deformities Neurologic: Alert & oriented x 3, CN II-XII intact, romberg neg, finger-> nose, heel-> shin equal b/l, Romberg neg, tandem gait steady Psychiatric: Speech and behavior appropriate   ED Course   Medications  ketorolac (TORADOL) injection 30 mg (30 mg Intramuscular Given 10/26/17 1921)  dexamethasone (DECADRON) injection 10 mg (10 mg Intramuscular Given 10/26/17 1919)  SUMAtriptan (IMITREX) injection 6 mg (6 mg Subcutaneous Given 10/26/17 1918)  acetaminophen (TYLENOL) tablet 1,000 mg (1,000 mg Oral Given 10/26/17 1917)    No orders of the defined types were placed in this  encounter.  No results found for this or any previous visit (from the past 24 hour(s)). No results found.   ED Clinical Impression  Migraine without aura and without status migrainosus, not intractable  Arthralgia of right temporomandibular joint  ED Assessment/Plan  Presentation consistent with a migraine triggered by TMJ.  Pt describing typical pain, no sudden onset. Doubt SAH, ICH or space occupying lesion. Pt without fevers/chills, Pt has no meningeal sx, no nuchal rigidity. Doubt meningitis. Pt with normal neuro exam, no evidence of CVA/TIA.  Pt BP not elevated significantly, doubt hypertensive emergency. No evidence of temporal artery tenderness, no evidence of glaucoma or other ocular pathology. Will give headache cocktail ( dexamethasone 10 IM, Imitrex 6 mg Imboden,toradol 30  IM), Tylenol 1000 mg, and reassess.  Dates that Imitrex has worked well for her in  the past.  No history of chest pain or elevated blood pressure with it.  She denies nausea or vomiting right now.  [place] controlled substances registry for this patient consulted and feel the risk/benefit ratio today is favorable for proceeding with this prescription for a controlled substance.  Last opiate prescription in January 2018.  Pt much improved after medications.  Headache has almost completely resolved and states that her jaw does not hurt anymore.  Pt with continued non-focal neuro exam.  Will d/c home with Flexeril, ibuprofen 600 mg with 1 g of Tylenol, 100 mg of Imitrex,  fioricet, and have pt F/U with PCP. Discussed MDM, plan for follow up, signs and sx that should prompt return to ER. Pt agrees with plan  Meds ordered this encounter  Medications  . ketorolac (TORADOL) injection 30 mg  . dexamethasone (DECADRON) injection 10 mg  . SUMAtriptan (IMITREX) injection 6 mg  . acetaminophen (TYLENOL) tablet 1,000 mg  . ibuprofen (ADVIL,MOTRIN) 600 MG tablet    Sig: Take 1 tablet (600 mg total) by mouth every 6  (six) hours as needed.    Dispense:  30 tablet    Refill:  0  . butalbital-acetaminophen-caffeine (FIORICET, ESGIC) 50-325-40 MG tablet    Sig: Take 1-2 tablets by mouth every 4 (four) hours as needed for headache. Max 6 caps/day    Dispense:  20 tablet    Refill:  0  . SUMAtriptan (IMITREX) 100 MG tablet    Sig: Take 1 tablet (100 mg total) by mouth every 2 (two) hours as needed for migraine. One tab at onset and may repeat x1 in 1 hour. Max 200 mg/24 hours    Dispense:  10 tablet    Refill:  0  . cyclobenzaprine (FLEXERIL) 10 MG tablet    Sig: Take 1 tablet (10 mg total) by mouth at bedtime.    Dispense:  20 tablet    Refill:  0    *This clinic note was created using [employer]. Therefore, there may be occasional mistakes despite careful proofreading.  ?   Melynda Ripple, MD 10/26/17 2012

## 2017-10-26 NOTE — Discharge Instructions (Signed)
600 mg ibuprofen with 1 g of Tylenol 3-4 times a day as needed for pain.  Or take the Fioricet instead.  Take the Imitrex 1 tablet at the onset of headache and another tab 2 hours later.  Do not exceed 200 mg in a 24-hour period.  The Flexeril will help keep you from grinding or teeth at night.  Try a soft diet for the next several days.  Follow-up with your primary care physician as needed, go immediately to the ER if you get worse, for the signs and symptoms we discussed or for other concerns.

## 2017-12-08 ENCOUNTER — Telehealth: Payer: Managed Care, Other (non HMO) | Admitting: Nurse Practitioner

## 2017-12-08 DIAGNOSIS — J0100 Acute maxillary sinusitis, unspecified: Secondary | ICD-10-CM

## 2017-12-08 MED ORDER — AMOXICILLIN-POT CLAVULANATE 875-125 MG PO TABS: 1.0000 | ORAL_TABLET | Freq: Two times a day (BID) | ORAL | 0 refills | Status: DC

## 2017-12-08 NOTE — Progress Notes (Signed)

## 2018-01-06 ENCOUNTER — Encounter: Payer: Self-pay | Admitting: Family Medicine

## 2018-01-06 ENCOUNTER — Ambulatory Visit: Payer: Managed Care, Other (non HMO) | Admitting: Family Medicine

## 2018-01-06 VITALS — BP 128/90 | HR 112 | Temp 97.4°F | Wt 225.2 lb

## 2018-01-06 DIAGNOSIS — R197 Diarrhea, unspecified: Secondary | ICD-10-CM

## 2018-01-06 DIAGNOSIS — R110 Nausea: Secondary | ICD-10-CM

## 2018-01-06 DIAGNOSIS — R1031 Right lower quadrant pain: Secondary | ICD-10-CM | POA: Diagnosis not present

## 2018-01-06 LAB — CBC WITH DIFFERENTIAL/PLATELET
Hematocrit: 37.2 % (ref 34.0–46.6)
Hemoglobin: 12.2 g/dL (ref 11.1–15.9)
Lymphocytes Absolute: 2.7 10*3/uL (ref 0.7–3.1)
Lymphs: 26 %
MCH: 25.5 pg — ABNORMAL LOW (ref 26.6–33.0)
MCHC: 32.8 g/dL (ref 31.5–35.7)
MCV: 78 fL — AB (ref 79–97)
MID (ABSOLUTE): 0.7 10*3/uL (ref 0.1–1.6)
MID: 7 %
NEUTROS ABS: 7 10*3/uL (ref 1.4–7.0)
Neutrophils: 67 %
PLATELETS: 582 10*3/uL — AB (ref 150–379)
RBC: 4.79 x10E6/uL (ref 3.77–5.28)
RDW: 16.9 % — ABNORMAL HIGH (ref 12.3–15.4)
WBC: 10.4 10*3/uL (ref 3.4–10.8)

## 2018-01-06 LAB — UA/M W/RFLX CULTURE, ROUTINE
Bilirubin, UA: NEGATIVE
Glucose, UA: NEGATIVE
Ketones, UA: NEGATIVE
LEUKOCYTES UA: NEGATIVE
Nitrite, UA: NEGATIVE
PROTEIN UA: NEGATIVE
RBC UA: NEGATIVE
Urobilinogen, Ur: 0.2 mg/dL (ref 0.2–1.0)
pH, UA: 6.5 (ref 5.0–7.5)

## 2018-01-06 MED ORDER — ONDANSETRON 4 MG PO TBDP: 4.0000 mg | ORAL_TABLET | Freq: Three times a day (TID) | ORAL | 0 refills | Status: DC | PRN

## 2018-01-06 NOTE — Progress Notes (Signed)
BP 128/90 (BP Location: Left Arm, Patient Position: Sitting, Cuff Size: Normal)   Pulse (!) 112   Temp (!) 97.4 F (36.3 C) (Oral)   Wt 225 lb 4 oz (102.2 kg)   LMP 01/01/2018   SpO2 97%   BMI 35.28 kg/m    Subjective:    Patient ID: [name], female    DOB: 1980/12/05, 37 y.o.   MRN: [id number]  HPI: NEVELYN MELLOTT is a 37 y.o. female  Chief Complaint  Patient presents with  . Right side pain    nausea, gas, pressure. Sharp stabbing pain on right side that began 1 week ago.   Nausea, diarrhea, bloating, and sharp stabbing right lower abdominal belly pain close to umbilicus, worsening over the past few days. Some intermittent vomiting. Tolerating PO for the most part. Trying OTC pain relievers with no relief. Denies urinary sxs, fevers, bloody stools or vomit, recent travel, sick contacts. Hx of cholecystectomy. LMP was last week.   Relevant past medical, surgical, family and social history reviewed and updated as indicated. Interim medical history since our last visit reviewed. Allergies and medications reviewed and updated.  Review of Systems  Per HPI unless specifically indicated above     Objective:    BP 128/90 (BP Location: Left Arm, Patient Position: Sitting, Cuff Size: Normal)   Pulse (!) 112   Temp (!) 97.4 F (36.3 C) (Oral)   Wt 225 lb 4 oz (102.2 kg)   LMP 01/01/2018   SpO2 97%   BMI 35.28 kg/m   Wt Readings from Last 3 Encounters:  01/08/18 225 lb (102.1 kg)  01/06/18 225 lb 4 oz (102.2 kg)  10/26/17 208 lb (94.3 kg)    Physical Exam  Constitutional: She is oriented to person, place, and time. She appears well-developed and well-nourished. No distress.  HENT:  Head: Atraumatic.  Eyes: EOM are normal.  Neck: Normal range of motion. Neck supple.  Cardiovascular: Normal heart sounds.  Tachycardic  Pulmonary/Chest: Effort normal and breath sounds normal. No respiratory distress.  Abdominal: Soft. Bowel sounds are normal. She exhibits no  distension and no mass. There is tenderness (Peri-umbilical and RLQ). There is guarding (minimal). There is no rebound. No hernia.  - [place] Minimally ttp at [place] point  Musculoskeletal: Normal range of motion.  Neurological: She is alert and oriented to person, place, and time.  Skin: Skin is warm and dry. She is not diaphoretic.  Psychiatric: She has a normal mood and affect. Her behavior is normal.  Nursing note and vitals reviewed.  Results for orders placed or performed in visit on 01/06/18  UA/M w/rflx Culture, Routine  Result Value Ref Range   Specific Gravity, UA <1.005 (L) 1.005 - 1.030   pH, UA 6.5 5.0 - 7.5   Color, UA Straw Yellow   Appearance Ur Clear Clear   Leukocytes, UA Negative Negative   Protein, UA Negative Negative/Trace   Glucose, UA Negative Negative   Ketones, UA Negative Negative   RBC, UA Negative Negative   Bilirubin, UA Negative Negative   Urobilinogen, Ur 0.2 0.2 - 1.0 mg/dL   Nitrite, UA Negative Negative  CBC With Differential/Platelet  Result Value Ref Range   WBC 10.4 3.4 - 10.8 x10E3/uL   RBC 4.79 3.77 - 5.28 x10E6/uL   Hemoglobin 12.2 11.1 - 15.9 g/dL   Hematocrit 37.2 34.0 - 46.6 %   MCV 78 (L) 79 - 97 fL   MCH 25.5 (L) 26.6 - 33.0 pg  MCHC 32.8 31.5 - 35.7 g/dL   RDW 16.9 (H) 12.3 - 15.4 %   Platelets 582 (H) 150 - 379 x10E3/uL   Neutrophils 67 Not Estab. %   Lymphs 26 Not Estab. %   MID 7 Not Estab. %   Neutrophils Absolute 7.0 1.4 - 7.0 x10E3/uL   Lymphocytes Absolute 2.7 0.7 - 3.1 x10E3/uL   MID (Absolute) 0.7 0.1 - 1.6 X10E3/uL      Assessment & Plan:   Problem List Items Addressed This Visit    None    Visit Diagnoses    RLQ abdominal pain    -  Primary   Relevant Orders   UA/M w/rflx Culture, Routine (Completed)   CBC With Differential/Platelet (Completed)   Diarrhea, unspecified type       Relevant Orders   CT Abdomen Pelvis Wo Contrast (Completed)   Nausea        VSS other than mild tachycardia, which isn't  abnormal for her historically. U/A and CBC benign in office. Given concern for appendicitis, diverticulitis, ovarian pathology, etc will order stat CT abdomen pelvis. Strict ER precautions reviewed for worsening pain, fevers, intolerance to PO, bloody vomit or stools, etc. In meantime, will tx with zofran for symptomatic relief and recommend pushing fluids, bland diet.   Follow up plan: Return if symptoms worsen or fail to improve.

## 2018-01-07 ENCOUNTER — Telehealth: Payer: Self-pay | Admitting: Family Medicine

## 2018-01-07 ENCOUNTER — Encounter: Payer: Self-pay | Admitting: Family Medicine

## 2018-01-07 ENCOUNTER — Ambulatory Visit
Admission: RE | Admit: 2018-01-07 | Discharge: 2018-01-07 | Disposition: A | Discharge status: Home / Self Care | Payer: Managed Care, Other (non HMO) | Source: Ambulatory Visit | Attending: Family Medicine | Admitting: Family Medicine

## 2018-01-07 DIAGNOSIS — K760 Fatty (change of) liver, not elsewhere classified: Secondary | ICD-10-CM | POA: Insufficient documentation

## 2018-01-07 DIAGNOSIS — R918 Other nonspecific abnormal finding of lung field: Secondary | ICD-10-CM | POA: Insufficient documentation

## 2018-01-07 DIAGNOSIS — R1031 Right lower quadrant pain: Secondary | ICD-10-CM | POA: Diagnosis present

## 2018-01-07 NOTE — Telephone Encounter (Signed)
Called pt regarding stat CT abdomen pelvis result, left voicemail that she could either call back or check her mychart where I left a result note and can call back if she had any follow up questions to it

## 2018-01-07 NOTE — Telephone Encounter (Signed)
Noted  

## 2018-01-08 ENCOUNTER — Emergency Department: Payer: Managed Care, Other (non HMO)

## 2018-01-08 ENCOUNTER — Emergency Department
Admission: EM | Admit: 2018-01-08 | Discharge: 2018-01-08 | Disposition: A | Discharge status: Home / Self Care | Payer: Managed Care, Other (non HMO) | Attending: Emergency Medicine | Admitting: Emergency Medicine

## 2018-01-08 ENCOUNTER — Other Ambulatory Visit: Payer: Self-pay

## 2018-01-08 DIAGNOSIS — Y92009 Unspecified place in unspecified non-institutional (private) residence as the place of occurrence of the external cause: Secondary | ICD-10-CM | POA: Diagnosis not present

## 2018-01-08 DIAGNOSIS — E119 Type 2 diabetes mellitus without complications: Secondary | ICD-10-CM | POA: Insufficient documentation

## 2018-01-08 DIAGNOSIS — Y999 Unspecified external cause status: Secondary | ICD-10-CM | POA: Diagnosis not present

## 2018-01-08 DIAGNOSIS — Z79899 Other long term (current) drug therapy: Secondary | ICD-10-CM | POA: Diagnosis not present

## 2018-01-08 DIAGNOSIS — Z7982 Long term (current) use of aspirin: Secondary | ICD-10-CM | POA: Diagnosis not present

## 2018-01-08 DIAGNOSIS — S93492A Sprain of other ligament of left ankle, initial encounter: Secondary | ICD-10-CM

## 2018-01-08 DIAGNOSIS — Y9301 Activity, walking, marching and hiking: Secondary | ICD-10-CM | POA: Diagnosis not present

## 2018-01-08 DIAGNOSIS — X501XXA Overexertion from prolonged static or awkward postures, initial encounter: Secondary | ICD-10-CM | POA: Insufficient documentation

## 2018-01-08 DIAGNOSIS — S99912A Unspecified injury of left ankle, initial encounter: Secondary | ICD-10-CM | POA: Diagnosis present

## 2018-01-08 DIAGNOSIS — Z7984 Long term (current) use of oral hypoglycemic drugs: Secondary | ICD-10-CM | POA: Diagnosis not present

## 2018-01-08 DIAGNOSIS — Z87891 Personal history of nicotine dependence: Secondary | ICD-10-CM | POA: Diagnosis not present

## 2018-01-08 DIAGNOSIS — J45909 Unspecified asthma, uncomplicated: Secondary | ICD-10-CM | POA: Insufficient documentation

## 2018-01-08 MED ORDER — TRAMADOL HCL 50 MG PO TABS: 50.0000 mg | ORAL_TABLET | Freq: Four times a day (QID) | ORAL | 0 refills | Status: DC | PRN

## 2018-01-08 MED ORDER — HYDROCODONE-ACETAMINOPHEN 5-325 MG PO TABS
1.0000 | ORAL_TABLET | Freq: Once | ORAL | Status: AC
  Administered 2018-01-08: 1 via ORAL
  Filled 2018-01-08: qty 1

## 2018-01-08 MED ORDER — MELOXICAM 15 MG PO TABS: 15.0000 mg | ORAL_TABLET | Freq: Every day | ORAL | 0 refills | Status: DC

## 2018-01-08 NOTE — ED Triage Notes (Signed)
Pt arrives to ED via POV from home with c/o LEFT ankle pain s/p slip and fall. No reported head injury or LOC. Pt reports slipping on a ramp when leaving a realitive's house around 745pm. No obvious deformity or dislocation, CMS intact.

## 2018-01-08 NOTE — ED Notes (Signed)
Pt fell today - left ankle/foot pain. xr completed in triage.

## 2018-01-08 NOTE — ED Notes (Signed)
First nurse note: pt states she fell going down a wheelchair ramp injuring left foot.

## 2018-01-08 NOTE — ED Provider Notes (Signed)
[facility] Emergency Department Provider Note  ____________________________________________  Time seen: Approximately 9:47 PM  I have reviewed the triage vital signs and the nursing notes.   HISTORY  Chief Complaint Ankle Pain    HPI Ashley Kidd is a 37 y.o. female who presents the emergency department complaining of left ankle pain.  Patient reports that she was at her mother's house, walking down a wooden stairway when she slipped on the wet wood.  Patient reports that her ankle "twisted" on the way down.  Patient has a significant history of sprains to the right ankle, however has not sprained her left ankle.  Patient reports that symptoms feel consistent with an ankle sprain.  Patient has difficulty bearing weight.  No other injury or complaint.  She did not hit her head or lose consciousness.  Patient is not taking medication for this complaint prior to arrival.  Patient has a history of asthma, diabetes, GERD, PCOS.  Patient denies any complaints with chronic medical problems.  Past Medical History:  Diagnosis Date  . Allergy   . Asthma   . Diabetes mellitus without complication ([place])   . GERD (gastroesophageal reflux disease)   . Migraine   . PCOS (polycystic ovarian syndrome)   . Sinus complaint     Patient Active Problem List   Diagnosis Date Noted  . C. difficile diarrhea 12/24/2015  . Migraine   . Allergy   . Asthma   . Low back pain 03/26/2015  . Chronic cholecystitis without calculus 05/19/2013    Past Surgical History:  Procedure Laterality Date  . CHOLECYSTECTOMY  06-09-13  . OVARIAN CYST REMOVAL Bilateral 2002  . UPPER GI ENDOSCOPY  11-29-12   Dr Candace Cruise    Prior to Admission medications   Medication Sig Start Date End Date Taking? Authorizing Provider  amoxicillin-clavulanate (AUGMENTIN) 875-125 MG tablet Take 1 tablet by mouth 2 (two) times daily. 12/08/17   Hassell Done Mary-Margaret, FNP  aspirin 81 MG tablet Take 81 mg by mouth  daily.    [provider]  butalbital-acetaminophen-caffeine (FIORICET, ESGIC) 50-325-40 MG tablet Take 1-2 tablets by mouth every 4 (four) hours as needed for headache. Max 6 caps/day 10/26/17   Melynda Ripple, MD  cetirizine (ZYRTEC) 10 MG tablet Take 10 mg by mouth daily.    [provider]  citalopram (CELEXA) 20 MG tablet Take 20 mg by mouth daily.    [provider]  cyclobenzaprine (FLEXERIL) 10 MG tablet Take 1 tablet (10 mg total) by mouth at bedtime. 10/26/17   Melynda Ripple, MD  diphenhydrAMINE (BENADRYL) 25 mg capsule Take 25 mg by mouth every 6 (six) hours as needed for itching.    [provider]  fluticasone Asencion Islam) 50 MCG/ACT nasal spray SPRAY TWICE IN EACH NOSTRIL EVERY DAY. 06/16/17   Volney American, PA-C  ibuprofen (ADVIL,MOTRIN) 600 MG tablet Take 1 tablet (600 mg total) by mouth every 6 (six) hours as needed. 10/26/17   Melynda Ripple, MD  meloxicam (MOBIC) 15 MG tablet Take 1 tablet (15 mg total) by mouth daily. 01/08/18   Jerrico Covello, Charline Bills, PA-C  metFORMIN (GLUCOPHAGE) 500 MG tablet Take 2 tablets twice daily 07/13/16   [provider]  omeprazole (PRILOSEC) 20 MG capsule Take 20 mg by mouth daily.    [provider]  ondansetron (ZOFRAN ODT) 4 MG disintegrating tablet Take 1 tablet (4 mg total) by mouth every 8 (eight) hours as needed. 01/06/18   Volney American, PA-C  SUMAtriptan (  IMITREX) 100 MG tablet Take 1 tablet (100 mg total) by mouth every 2 (two) hours as needed for migraine. One tab at onset and may repeat x1 in 1 hour. Max 200 mg/24 hours 10/26/17   Melynda Ripple, MD  traMADol (ULTRAM) 50 MG tablet Take 1 tablet (50 mg total) by mouth every 6 (six) hours as needed. 01/08/18   Momina Hunton, Charline Bills, PA-C  VENTOLIN HFA 108 (90 Base) MCG/ACT inhaler INHALE 2 PUFFS INTO THE LUNGS EVERY 6 HOURS AS NEEDED FOR WHEEZING 08/23/17   Volney American, PA-C  ZOVIA 1/35E, 28, 1-35 MG-MCG tablet  Take by mouth daily. 05/03/16   [provider]    Allergies Bee venom  Family History  Problem Relation Age of Onset  . Diabetes Mother   . Asthma Mother   . Diabetes Father   . Heart attack Father   . Heart disease Father   . Asthma Sister   . Bipolar disorder Sister   . Asthma Brother   . Bipolar disorder Brother   . Epilepsy Brother     Social History Social History   Tobacco Use  . Smoking status: Former Smoker    Years: 12.00    Last attempt to quit: 08/24/2012    Years since quitting: 5.3  . Smokeless tobacco: Never Used  Substance Use Topics  . Alcohol use: No    Alcohol/week: 0.0 oz  . Drug use: No     Review of Systems  Constitutional: No fever/chills Eyes: No visual changes. Cardiovascular: no chest pain. Respiratory: no cough. No SOB. Gastrointestinal: No abdominal pain.  No nausea, no vomiting.  Musculoskeletal: Positive for left ankle pain Skin: Negative for rash, abrasions, lacerations, ecchymosis. Neurological: Negative for headaches, focal weakness or numbness. 10-point ROS otherwise negative.  ____________________________________________   PHYSICAL EXAM:  VITAL SIGNS: ED Triage Vitals  Enc Vitals Group     BP 01/08/18 2030 138/83     Pulse Rate 01/08/18 2030 100     Resp 01/08/18 2030 18     Temp 01/08/18 2030 98.7 F (37.1 C)     Temp Source 01/08/18 2030 Oral     SpO2 01/08/18 2030 96 %     Weight 01/08/18 2031 225 lb (102.1 kg)     Height 01/08/18 2031 5\' 7"  (1.702 m)     Head Circumference --      Peak Flow --      Pain Score 01/08/18 2031 4     Pain Loc --      Pain Edu? --      Excl. in [place]? --      Constitutional: Alert and oriented. Well appearing and in no acute distress. Eyes: Conjunctivae are normal. PERRL. EOMI. Head: Atraumatic. Neck: No stridor.    Cardiovascular: Normal rate, regular rhythm. Normal S1 and S2.  Good peripheral circulation. Respiratory: Normal respiratory effort without tachypnea or  retractions. Lungs CTAB. Good air entry to the bases with no decreased or absent breath sounds. Musculoskeletal: Full range of motion to all extremities. No gross deformities appreciated.  Edema noted to the left ankle when compared with unaffected.  Patient is limited range of motion due to pain.  Patient is tender to palpation along the anterior talofibular ligament distribution with no other tenderness to palpation.  Patient has good flexion, extension, inversion and eversion of the ankle joint.  Dorsalis pedis pulse intact.  Sensation intact all 5 digits. Neurologic:  Normal speech and language. No gross focal neurologic deficits are  appreciated.  Skin:  Skin is warm, dry and intact. No rash noted. Psychiatric: Mood and affect are normal. Speech and behavior are normal. Patient exhibits appropriate insight and judgement.   ____________________________________________   LABS (all labs ordered are listed, but only abnormal results are displayed)  Labs Reviewed - No data to display ____________________________________________  EKG   ____________________________________________  RADIOLOGY Diamantina Providence Marvelle Caudill, personally viewed and evaluated these images (plain radiographs) as part of my medical decision making, as well as reviewing the written report by the radiologist.  Dg Ankle Complete Left  Result Date: 01/08/2018 CLINICAL DATA:  Left ankle pain after slip and fall. EXAM: LEFT ANKLE COMPLETE - 3+ VIEW COMPARISON:  None. FINDINGS: There is no evidence of fracture or dislocation. The alignment and ankle mortise are preserved. There is a plantar calcaneal spur. There is no evidence of arthropathy or other focal bone abnormality. Small tibiotalar joint effusion. Soft tissues are unremarkable. IMPRESSION: Small joint effusion without acute osseous abnormality. Electronically Signed   By: Jeb Levering M.D.   On: 01/08/2018 21:10     ____________________________________________    PROCEDURES  Procedure(s) performed:    Procedures    Medications  HYDROcodone-acetaminophen (NORCO/VICODIN) 5-325 MG per tablet 1 tablet (1 tablet Oral Given 01/08/18 2201)     ____________________________________________   INITIAL IMPRESSION / ASSESSMENT AND PLAN / ED COURSE  Pertinent labs & imaging results that were available during my care of the patient were reviewed by me and considered in my medical decision making (see chart for details).  Review of the [place] CSRS was performed in accordance of the [place] prior to dispensing any controlled drugs.     Patient's diagnosis is consistent with left ankle sprain.  Patient presents status post injury to the left ankle.  Differential included sprain, contusion, fracture, dislocation.  X-ray reveals no acute osseous abnormality.  Exam is reassuring.  Patient is given Ace bandage and crutches.. Patient will be discharged home with prescriptions for meloxicam and very limited Ultram for pain. Patient is to follow up with podiatry as needed or otherwise directed. Patient is given ED precautions to return to the ED for any worsening or new symptoms.     ____________________________________________  FINAL CLINICAL IMPRESSION(S) / ED DIAGNOSES  Final diagnoses:  Sprain of anterior talofibular ligament of left ankle, initial encounter      NEW MEDICATIONS STARTED DURING THIS VISIT:  ED Discharge Orders        Ordered    meloxicam (MOBIC) 15 MG tablet  Daily     01/08/18 2149    traMADol (ULTRAM) 50 MG tablet  Every 6 hours PRN     01/08/18 2149          This chart was dictated using voice recognition software/Dragon. Despite best efforts to proofread, errors can occur which can change the meaning. Any change was purely unintentional.    Darletta Moll, PA-C 01/08/18 2212    Lavonia Drafts, MD 01/08/18 2233

## 2018-01-09 NOTE — Patient Instructions (Signed)
Follow up as needed

## 2018-01-10 NOTE — Telephone Encounter (Signed)
Noted     Closing encounter

## 2018-01-10 NOTE — Telephone Encounter (Signed)
Apolonio Schneiders, Have you talked to this patient?

## 2018-01-10 NOTE — Telephone Encounter (Signed)
She checked in via mychart after reading her result and getting my VM

## 2018-02-16 ENCOUNTER — Telehealth: Payer: Managed Care, Other (non HMO) | Admitting: Family

## 2018-02-16 DIAGNOSIS — J019 Acute sinusitis, unspecified: Secondary | ICD-10-CM

## 2018-02-16 MED ORDER — AMOXICILLIN-POT CLAVULANATE 875-125 MG PO TABS: 1.0000 | ORAL_TABLET | Freq: Two times a day (BID) | ORAL | 0 refills | Status: DC

## 2018-02-16 NOTE — Progress Notes (Signed)

## 2018-03-03 ENCOUNTER — Ambulatory Visit
Admission: EM | Admit: 2018-03-03 | Discharge: 2018-03-03 | Disposition: A | Discharge status: Home / Self Care | Payer: Managed Care, Other (non HMO) | Attending: Family Medicine | Admitting: Family Medicine

## 2018-03-03 ENCOUNTER — Other Ambulatory Visit: Payer: Self-pay

## 2018-03-03 ENCOUNTER — Encounter: Payer: Self-pay | Admitting: Emergency Medicine

## 2018-03-03 DIAGNOSIS — R059 Cough, unspecified: Secondary | ICD-10-CM

## 2018-03-03 DIAGNOSIS — R05 Cough: Secondary | ICD-10-CM | POA: Diagnosis not present

## 2018-03-03 DIAGNOSIS — J029 Acute pharyngitis, unspecified: Secondary | ICD-10-CM

## 2018-03-03 MED ORDER — PREDNISONE 50 MG PO TABS: ORAL_TABLET | ORAL | 0 refills | Status: DC

## 2018-03-03 MED ORDER — HYDROCOD POLST-CPM POLST ER 10-8 MG/5ML PO SUER: 5.0000 mL | Freq: Two times a day (BID) | ORAL | 0 refills | Status: DC | PRN

## 2018-03-03 NOTE — Discharge Instructions (Signed)
Medications as prescribed.  Give it some time; It will improve.  Take care  Dr. Lacinda Axon

## 2018-03-03 NOTE — ED Provider Notes (Signed)
[facility]    CSN: [id number] Arrival date & time: 03/03/18  1752   History   Chief Complaint Chief Complaint  Patient presents with  . URI   HPI   37 year old female presents with cough.  Patient recently diagnosed and treated for sinusitis via an E visit.  Was placed on Augmentin.  Patient states that her respiratory symptoms have improved.  However, she has had persistent cough which is productive.  She reports postnasal drip.  Associated sore throat and ear discomfort.  She reports chest tightness and discomfort from excessive coughing.  She states that she feels like she is having asthma flare as well.  No recent fever.  No known relieving factors.  No other associated symptoms.  No other complaints.  Past Medical History:  Diagnosis Date  . Allergy   . Asthma   . Diabetes mellitus without complication ([place])   . GERD (gastroesophageal reflux disease)   . Migraine   . PCOS (polycystic ovarian syndrome)   . Sinus complaint     Patient Active Problem List   Diagnosis Date Noted  . C. difficile diarrhea 12/24/2015  . Migraine   . Allergy   . Asthma   . Low back pain 03/26/2015  . Chronic cholecystitis without calculus 05/19/2013    Past Surgical History:  Procedure Laterality Date  . CHOLECYSTECTOMY  06-09-13  . OVARIAN CYST REMOVAL Bilateral 2002  . UPPER GI ENDOSCOPY  11-29-12   Dr Candace Cruise    OB History    Gravida  0   Para  0   Term  0   Preterm  0   AB  0   Living  0     SAB  0   TAB  0   Ectopic  0   Multiple  0   Live Births           Obstetric Comments  1st Menstrual Cycle: 13 1st Pregnancy:  NA         Home Medications    Prior to Admission medications   Medication Sig Start Date End Date Taking? Authorizing Provider  butalbital-acetaminophen-caffeine (FIORICET, ESGIC) 50-325-40 MG tablet Take 1-2 tablets by mouth every 4 (four) hours as needed for headache. Max 6 caps/day 10/26/17  Yes Melynda Ripple, MD    cetirizine (ZYRTEC) 10 MG tablet Take 10 mg by mouth daily.   Yes [provider]  citalopram (CELEXA) 20 MG tablet Take 20 mg by mouth daily.   Yes [provider]  diphenhydrAMINE (BENADRYL) 25 mg capsule Take 25 mg by mouth every 6 (six) hours as needed for itching.   Yes [provider]  fluticasone (FLONASE) 50 MCG/ACT nasal spray SPRAY TWICE IN EACH NOSTRIL EVERY DAY. 06/16/17  Yes Volney American, PA-C  ibuprofen (ADVIL,MOTRIN) 600 MG tablet Take 1 tablet (600 mg total) by mouth every 6 (six) hours as needed. 10/26/17  Yes Melynda Ripple, MD  omeprazole (PRILOSEC) 20 MG capsule Take 20 mg by mouth daily.   Yes [provider]  ondansetron (ZOFRAN ODT) 4 MG disintegrating tablet Take 1 tablet (4 mg total) by mouth every 8 (eight) hours as needed. 01/06/18  Yes Volney American, PA-C  SUMAtriptan (IMITREX) 100 MG tablet Take 1 tablet (100 mg total) by mouth every 2 (two) hours as needed for migraine. One tab at onset and may repeat x1 in 1 hour. Max 200 mg/24 hours 10/26/17  Yes Melynda Ripple, MD  VENTOLIN HFA 108 [phone] Base) MCG/ACT  inhaler INHALE 2 PUFFS INTO THE LUNGS EVERY 6 HOURS AS NEEDED FOR WHEEZING 08/23/17  Yes Volney American, PA-C  ZOVIA 1/35E, 28, 1-35 MG-MCG tablet Take by mouth daily. 05/03/16  Yes [provider]  aspirin 81 MG tablet Take 81 mg by mouth daily.    [provider]  chlorpheniramine-HYDROcodone (TUSSIONEX PENNKINETIC ER) 10-8 MG/5ML SUER Take 5 mLs by mouth every 12 (twelve) hours as needed. 03/03/18   Coral Spikes, DO  predniSONE (DELTASONE) 50 MG tablet 1 tablet daily x 5 days. 03/03/18   Coral Spikes, DO    Family History Family History  Problem Relation Age of Onset  . Diabetes Mother   . Asthma Mother   . Diabetes Father   . Heart attack Father   . Heart disease Father   . Asthma Sister   . Bipolar disorder Sister   . Asthma Brother   . Bipolar disorder Brother   . Epilepsy  Brother     Social History Social History   Tobacco Use  . Smoking status: Former Smoker    Years: 12.00    Last attempt to quit: 08/24/2012    Years since quitting: 5.5  . Smokeless tobacco: Never Used  Substance Use Topics  . Alcohol use: No    Alcohol/week: 0.0 oz  . Drug use: No     Allergies   Bee venom   Review of Systems Review of Systems  HENT: Positive for ear pain, postnasal drip and sore throat.   Respiratory: Positive for cough and chest tightness.    Physical Exam Triage Vital Signs ED Triage Vitals  Enc Vitals Group     BP 03/03/18 1807 134/87     Pulse Rate 03/03/18 1807 85     Resp 03/03/18 1807 18     Temp 03/03/18 1807 97.7 F (36.5 C)     Temp Source 03/03/18 1807 Oral     SpO2 03/03/18 1807 99 %     Weight 03/03/18 1804 215 lb (97.5 kg)     Height 03/03/18 1804 5\' 7"  (1.702 m)     Head Circumference --      Peak Flow --      Pain Score 03/03/18 1804 3     Pain Loc --      Pain Edu? --      Excl. in [place]? --    Updated Vital Signs BP 134/87 (BP Location: Left Arm)   Pulse 85   Temp 97.7 F (36.5 C) (Oral)   Resp 18   Ht 5\' 7"  (1.702 m)   Wt 215 lb (97.5 kg)   LMP 02/23/2018 (Approximate)   SpO2 99%   BMI 33.67 kg/m  Physical Exam  Constitutional: She is oriented to person, place, and time. She appears well-developed. No distress.  HENT:  Head: Normocephalic and atraumatic.  Mouth/Throat: Oropharynx is clear and moist.  Normal TM's.  Eyes: Conjunctivae are normal. Right eye exhibits no discharge. Left eye exhibits no discharge.  Cardiovascular: Normal rate and regular rhythm.  Pulmonary/Chest: Effort normal and breath sounds normal. She has no wheezes. She has no rales.  Neurological: She is alert and oriented to person, place, and time.  Psychiatric: She has a normal mood and affect. Her behavior is normal.  Nursing note and vitals reviewed.  UC Treatments / Results  Labs (all labs ordered are listed, but only abnormal  results are displayed) Labs Reviewed - No data to display  EKG None  Radiology No  results found.  Procedures Procedures (including critical care time)  Medications Ordered in UC Medications - No data to display  Initial Impression / Assessment and Plan / UC Course  I have reviewed the triage vital signs and the nursing notes.  Pertinent labs & imaging results that were available during my care of the patient were reviewed by me and considered in my medical decision making (see chart for details).    37 year old female presents with postinfectious cough.  Treating with prednisone and Tussionex.   Final Clinical Impressions(s) / UC Diagnoses   Final diagnoses:  Cough     Discharge Instructions     Medications as prescribed.  Give it some time; It will improve.  Take care  Dr. Lacinda Axon    ED Prescriptions    Medication Sig Dispense Auth. Provider   predniSONE (DELTASONE) 50 MG tablet 1 tablet daily x 5 days. 5 tablet [place], Earley Grobe G, DO   chlorpheniramine-HYDROcodone (TUSSIONEX PENNKINETIC ER) 10-8 MG/5ML SUER Take 5 mLs by mouth every 12 (twelve) hours as needed. 115 mL Coral Spikes, DO     Controlled Substance Prescriptions [place] Controlled Substance Registry consulted? Not Applicable   Coral Spikes, DO 03/03/18 1859

## 2018-03-03 NOTE — ED Triage Notes (Signed)
Patient c/o cough that started 2 weeks ago, diagnosed with sinus infection and completed course of antibiotics. Cough and nasal drainage still present.

## 2018-03-04 ENCOUNTER — Ambulatory Visit: Payer: Self-pay | Admitting: *Deleted

## 2018-03-04 ENCOUNTER — Emergency Department
Admission: EM | Admit: 2018-03-04 | Discharge: 2018-03-04 | Disposition: A | Discharge status: Home / Self Care | Payer: Managed Care, Other (non HMO) | Attending: Emergency Medicine | Admitting: Emergency Medicine

## 2018-03-04 DIAGNOSIS — T7840XA Allergy, unspecified, initial encounter: Secondary | ICD-10-CM | POA: Insufficient documentation

## 2018-03-04 DIAGNOSIS — L509 Urticaria, unspecified: Secondary | ICD-10-CM | POA: Diagnosis present

## 2018-03-04 DIAGNOSIS — E119 Type 2 diabetes mellitus without complications: Secondary | ICD-10-CM | POA: Insufficient documentation

## 2018-03-04 DIAGNOSIS — Z79899 Other long term (current) drug therapy: Secondary | ICD-10-CM | POA: Diagnosis not present

## 2018-03-04 DIAGNOSIS — Z87891 Personal history of nicotine dependence: Secondary | ICD-10-CM | POA: Insufficient documentation

## 2018-03-04 DIAGNOSIS — Z7982 Long term (current) use of aspirin: Secondary | ICD-10-CM | POA: Diagnosis not present

## 2018-03-04 DIAGNOSIS — J45909 Unspecified asthma, uncomplicated: Secondary | ICD-10-CM | POA: Diagnosis not present

## 2018-03-04 MED ORDER — PREDNISONE 10 MG PO TABS: ORAL_TABLET | ORAL | 0 refills | Status: DC

## 2018-03-04 MED ORDER — METHYLPREDNISOLONE SODIUM SUCC 125 MG IJ SOLR
125.0000 mg | Freq: Once | INTRAMUSCULAR | Status: AC
  Administered 2018-03-04: 125 mg via INTRAVENOUS
  Filled 2018-03-04: qty 2

## 2018-03-04 MED ORDER — DIPHENHYDRAMINE HCL 25 MG PO CAPS: 25.0000 mg | ORAL_CAPSULE | ORAL | 0 refills | Status: DC | PRN

## 2018-03-04 MED ORDER — RANITIDINE HCL 150 MG PO TABS: 150.0000 mg | ORAL_TABLET | Freq: Two times a day (BID) | ORAL | 0 refills | Status: DC

## 2018-03-04 MED ORDER — FAMOTIDINE IN NACL 20-0.9 MG/50ML-% IV SOLN
20.0000 mg | Freq: Once | INTRAVENOUS | Status: AC
  Administered 2018-03-04: 20 mg via INTRAVENOUS
  Filled 2018-03-04: qty 50

## 2018-03-04 MED ORDER — EPINEPHRINE 0.3 MG/0.3ML IJ SOAJ: 0.3000 mg | Freq: Once | INTRAMUSCULAR | 1 refills | Status: AC

## 2018-03-04 NOTE — ED Provider Notes (Signed)
[facility] Emergency Department Provider Note  ____________________________________________  Time seen: Approximately 9:55 AM  I have reviewed the triage vital signs and the nursing notes.   HISTORY  Chief Complaint Allergic Reaction    HPI Ashley Kidd is a 37 y.o. female that presents to the emergency department for evaluation of allergic reaction.  Patient states that she took hydrocodone/Chlorphen ER suspension for a cough for the first time last night at 8pm.  Shortly after she broke out in hives.  She went to bed and woke up with hives still this morning.  She feels like both lips are swollen.  She took Benadryl at 8 this morning, which resolved hives. She takes zyrtec daily. She has prediabetes. No fever, chills, tongue swelling, throat tingling, shortness of breath.    Past Medical History:  Diagnosis Date  . Allergy   . Asthma   . Diabetes mellitus without complication ([place])   . GERD (gastroesophageal reflux disease)   . Migraine   . PCOS (polycystic ovarian syndrome)   . Sinus complaint     Patient Active Problem List   Diagnosis Date Noted  . C. difficile diarrhea 12/24/2015  . Migraine   . Allergy   . Asthma   . Low back pain 03/26/2015  . Chronic cholecystitis without calculus 05/19/2013    Past Surgical History:  Procedure Laterality Date  . CHOLECYSTECTOMY  06-09-13  . OVARIAN CYST REMOVAL Bilateral 2002  . UPPER GI ENDOSCOPY  11-29-12   Dr Candace Cruise    Prior to Admission medications   Medication Sig Start Date End Date Taking? Authorizing Provider  aspirin 81 MG tablet Take 81 mg by mouth daily.    [provider]  butalbital-acetaminophen-caffeine (FIORICET, ESGIC) 50-325-40 MG tablet Take 1-2 tablets by mouth every 4 (four) hours as needed for headache. Max 6 caps/day 10/26/17   Melynda Ripple, MD  cetirizine (ZYRTEC) 10 MG tablet Take 10 mg by mouth daily.    [provider]  chlorpheniramine-HYDROcodone  (TUSSIONEX PENNKINETIC ER) 10-8 MG/5ML SUER Take 5 mLs by mouth every 12 (twelve) hours as needed. 03/03/18   Coral Spikes, DO  citalopram (CELEXA) 20 MG tablet Take 20 mg by mouth daily.    [provider]  diphenhydrAMINE (BENADRYL) 25 mg capsule Take 1 capsule (25 mg total) by mouth every 4 (four) hours as needed. 03/04/18 03/04/19  Laban Emperor, PA-C  EPINEPHrine 0.3 mg/0.3 mL IJ SOAJ injection Inject 0.3 mLs (0.3 mg total) into the muscle once for 1 dose. 03/04/18 03/04/18  Laban Emperor, PA-C  fluticasone [facility]) 50 MCG/ACT nasal spray SPRAY TWICE IN EACH NOSTRIL EVERY DAY. 06/16/17   Volney American, PA-C  ibuprofen (ADVIL,MOTRIN) 600 MG tablet Take 1 tablet (600 mg total) by mouth every 6 (six) hours as needed. 10/26/17   Melynda Ripple, MD  omeprazole (PRILOSEC) 20 MG capsule Take 20 mg by mouth daily.    [provider]  ondansetron (ZOFRAN ODT) 4 MG disintegrating tablet Take 1 tablet (4 mg total) by mouth every 8 (eight) hours as needed. 01/06/18   Volney American, PA-C  predniSONE (DELTASONE) 10 MG tablet Take 6 tablets on day 1, take 5 tablets on day 2, take 4 tablets on day 3, take 3 tablets on day 4, take 2 tablets on day 5, take 1 tablet on day 6 03/04/18   Laban Emperor, PA-C  ranitidine (ZANTAC) 150 MG tablet Take 1 tablet (150 mg total) by mouth 2 (two) times daily.  03/04/18 03/04/19  Laban Emperor, PA-C  SUMAtriptan (IMITREX) 100 MG tablet Take 1 tablet (100 mg total) by mouth every 2 (two) hours as needed for migraine. One tab at onset and may repeat x1 in 1 hour. Max 200 mg/24 hours 10/26/17   Melynda Ripple, MD  VENTOLIN HFA [phone] Base) MCG/ACT inhaler INHALE 2 PUFFS INTO THE LUNGS EVERY 6 HOURS AS NEEDED FOR WHEEZING 08/23/17   Volney American, PA-C  ZOVIA 1/35E, 28, 1-35 MG-MCG tablet Take by mouth daily. 05/03/16   [provider]    Allergies Bee venom  Family History  Problem Relation Age of Onset  . Diabetes Mother   .  Asthma Mother   . Diabetes Father   . Heart attack Father   . Heart disease Father   . Asthma Sister   . Bipolar disorder Sister   . Asthma Brother   . Bipolar disorder Brother   . Epilepsy Brother     Social History Social History   Tobacco Use  . Smoking status: Former Smoker    Years: 12.00    Last attempt to quit: 08/24/2012    Years since quitting: 5.5  . Smokeless tobacco: Never Used  Substance Use Topics  . Alcohol use: No    Alcohol/week: 0.0 oz  . Drug use: No     Review of Systems  Constitutional: No fever/chills Cardiovascular: No chest pain. Respiratory: No SOB. Gastrointestinal: No abdominal pain.  No nausea, no vomiting.  Musculoskeletal: Negative for musculoskeletal pain. Skin: Negative for abrasions, lacerations, ecchymosis. Positive for rash.   ____________________________________________   PHYSICAL EXAM:  VITAL SIGNS: ED Triage Vitals  Enc Vitals Group     BP 03/04/18 0932 (!) 154/100     Pulse Rate 03/04/18 0932 96     Resp 03/04/18 0932 18     Temp 03/04/18 0932 (!) 97.4 F (36.3 C)     Temp Source 03/04/18 0932 Oral     SpO2 03/04/18 0932 98 %     Weight 03/04/18 0933 220 lb (99.8 kg)     Height 03/04/18 0933 5\' 7"  (1.702 m)     Head Circumference --      Peak Flow --      Pain Score 03/04/18 0932 0     Pain Loc --      Pain Edu? --      Excl. in [place]? --      Constitutional: Alert and oriented. Well appearing and in no acute distress. Eyes: Conjunctivae are normal. PERRL. EOMI. Head: Atraumatic. ENT:      Ears:      Nose: No congestion/rhinnorhea.      Mouth/Throat: Mucous membranes are moist. No visible swelling to lips. Neck: No stridor. Cardiovascular: Normal rate, regular rhythm.  Good peripheral circulation. Respiratory: Normal respiratory effort without tachypnea or retractions. Lungs CTAB. Good air entry to the bases with no decreased or absent breath sounds. Musculoskeletal: Full range of motion to all extremities. No  gross deformities appreciated. Neurologic:  Normal speech and language. No gross focal neurologic deficits are appreciated.  Skin:  Skin is warm, dry and intact. No rash noted. Psychiatric: Mood and affect are normal. Speech and behavior are normal. Patient exhibits appropriate insight and judgement.   ____________________________________________   LABS (all labs ordered are listed, but only abnormal results are displayed)  Labs Reviewed - No data to display ____________________________________________  EKG   ____________________________________________  RADIOLOGY   No results found.  ____________________________________________  PROCEDURES  Procedure(s) performed:    Procedures    Medications  methylPREDNISolone sodium succinate (SOLU-MEDROL) 125 mg/2 mL injection 125 mg (125 mg Intravenous Given 03/04/18 1012)  famotidine (PEPCID) IVPB 20 mg premix (0 mg Intravenous Stopped 03/04/18 1122)     ____________________________________________   INITIAL IMPRESSION / ASSESSMENT AND PLAN / ED COURSE  Pertinent labs & imaging results that were available during my care of the patient were reviewed by me and considered in my medical decision making (see chart for details).  Review of the [place] CSRS was performed in accordance of the [place] prior to dispensing any controlled drugs.     Patient presented to the emergency department for evaluation of allergic reaction. Vital signs and exam are reassuring. Patient denies SOB or throat tingling, SOB. She has not had any hives while in ED. She took 50mg  Benadryl this morning. She received IV pepcid and solumedrol in ED. Symptoms improved after. Patient will be discharged home with prescriptions for prednisone, benadryl, ranidadine, epi pain. Patient is to follow up with PCP as directed. Patient is given ED precautions to return to the ED for any worsening or new symptoms.     ____________________________________________  FINAL  CLINICAL IMPRESSION(S) / ED DIAGNOSES  Final diagnoses:  Allergic reaction, initial encounter      NEW MEDICATIONS STARTED DURING THIS VISIT:  ED Discharge Orders        Ordered    predniSONE (DELTASONE) 10 MG tablet     03/04/18 1048    ranitidine (ZANTAC) 150 MG tablet  2 times daily     03/04/18 1048    diphenhydrAMINE (BENADRYL) 25 mg capsule  Every 4 hours PRN     03/04/18 1048    EPINEPHrine 0.3 mg/0.3 mL IJ SOAJ injection   Once     03/04/18 1049          This chart was dictated using voice recognition software/Dragon. Despite best efforts to proofread, errors can occur which can change the meaning. Any change was purely unintentional.    Laban Emperor, PA-C 03/04/18 1337    Nena Polio, MD 03/04/18 [phone]

## 2018-03-04 NOTE — Telephone Encounter (Signed)
Patient was seen at [facility]- she took hydrocodone cough syrup. She has itched all night , she has hives- "splotches" all over- shoulders , face is red and she reports she has swollen lips. She is going to take Benadryl now. Called patient back and she is on the way now.  Reason for Disposition . Sounds like a life-threatening emergency to the triager  Answer Assessment - Initial Assessment Questions 1. ONSET: "When did the swelling start?" (e.g., minutes, hours, days)     Last night 2. SEVERITY: "How swollen is it?"     Lips swollen-more than double size 3. ITCHING: "Is there any itching?" If so, ask: "How much?"   (Scale 1-10; mild, moderate or severe)     Itching all over 4. PAIN: "Is the swelling painful to touch?" If so, ask: "How painful is it?"   (Scale 1-10; mild, moderate or severe)     no 5. CAUSE: "What do you think is causing the lip swelling?"     Allergic reaction 6. RECURRENT SYMPTOM: "Have you had lip swelling before?" If so, ask: "When was the last time?" "What happened that time?"     no 7. OTHER SYMPTOMS: "Do you have any other symptoms?" (e.g., toothache)     Red face, spots on shoulders 8. PREGNANCY: "Is there any chance you are pregnant?" "When was your last menstrual period?"     No- LMP- last week  Protocols used: LIP [facility]

## 2018-03-04 NOTE — ED Triage Notes (Signed)
Pt took liquid Hydrocodone last night approx 10PM and has had hives, itching and facial swelling since. Pt RR even and unlabored. Lips are swollen. Took benadryl this AM.

## 2018-03-04 NOTE — ED Notes (Signed)
Pt called to inform her that the medication she had left in the ED (hydrocodone/chlorphen er suspension) would be sent to the pharmacy for storage.

## 2018-03-04 NOTE — ED Notes (Signed)
See triage note .States she woke up with facial swelling and some hives  Took a benadryl this am   States hives have eased off  Min swelling noted to lip  No diff swallowing  States she was started on Tussinex yesterday  States she developed some itching last pm     resp even and non labored

## 2018-03-07 ENCOUNTER — Encounter: Payer: Self-pay | Admitting: Family Medicine

## 2018-03-07 ENCOUNTER — Ambulatory Visit: Payer: Managed Care, Other (non HMO) | Admitting: Family Medicine

## 2018-03-07 VITALS — BP 134/85 | HR 94 | Temp 97.6°F | Wt 224.5 lb

## 2018-03-07 DIAGNOSIS — L509 Urticaria, unspecified: Secondary | ICD-10-CM

## 2018-03-07 DIAGNOSIS — R05 Cough: Secondary | ICD-10-CM

## 2018-03-07 DIAGNOSIS — R059 Cough, unspecified: Secondary | ICD-10-CM

## 2018-03-07 MED ORDER — BENZONATATE 200 MG PO CAPS: 200.0000 mg | ORAL_CAPSULE | Freq: Two times a day (BID) | ORAL | 0 refills | Status: DC | PRN

## 2018-03-07 MED ORDER — RANITIDINE HCL 150 MG PO TABS: 150.0000 mg | ORAL_TABLET | Freq: Two times a day (BID) | ORAL | 0 refills | Status: DC

## 2018-03-07 MED ORDER — PREDNISONE 10 MG PO TABS: ORAL_TABLET | ORAL | 0 refills | Status: DC

## 2018-03-07 MED ORDER — HYDROXYZINE HCL 25 MG PO TABS: 25.0000 mg | ORAL_TABLET | Freq: Three times a day (TID) | ORAL | 1 refills | Status: DC | PRN

## 2018-03-07 NOTE — Progress Notes (Signed)
BP 134/85 (BP Location: Right Arm, Patient Position: Sitting, Cuff Size: Normal)   Pulse 94   Temp 97.6 F (36.4 C) (Oral)   Wt 224 lb 8 oz (101.8 kg)   LMP 02/23/2018 (Approximate)   SpO2 100%   BMI 35.16 kg/m    Subjective:    Patient ID: Ashley Kidd, female    DOB: Jan 06, 1981, 37 y.o.   MRN: [id number]  HPI: Ashley Kidd is a 37 y.o. female  Chief Complaint  Patient presents with  . Allergic Reaction  . Urticaria   ER FOLLOW UP- went to urgent care for a cough on Thursday and started on tussionex. Had allergic reaction to the tussionex when she woke up with hives and her lips swollen. Got a bit better with benadryl, went to the ER and was given IV pepcid and solumedrol. Sent home on prednisone, benadryl, zantac and an epi pen Time since discharge: 3 days Hospital/facility: [facility] Diagnosis: Hives/Allergic reaction Procedures/tests: None Consultants: None New medications: prednisone, benadryl, zantac and an epi pen Discharge instructions:  Follow up here Status: stable    Relevant past medical, surgical, family and social history reviewed and updated as indicated. Interim medical history since our last visit reviewed. Allergies and medications reviewed and updated.  Review of Systems  Constitutional: Negative.   HENT: Negative.   Eyes: Negative.   Respiratory: Positive for cough. Negative for apnea, choking, chest tightness, shortness of breath, wheezing and stridor.   Cardiovascular: Negative.   Gastrointestinal: Negative.   Musculoskeletal: Negative.   Skin: Positive for rash. Negative for color change, pallor and wound.  Psychiatric/Behavioral: Negative.     Per HPI unless specifically indicated above     Objective:    BP 134/85 (BP Location: Right Arm, Patient Position: Sitting, Cuff Size: Normal)   Pulse 94   Temp 97.6 F (36.4 C) (Oral)   Wt 224 lb 8 oz (101.8 kg)   LMP 02/23/2018 (Approximate)   SpO2 100%   BMI 35.16 kg/m   Wt Readings  from Last 3 Encounters:  03/07/18 224 lb 8 oz (101.8 kg)  03/04/18 220 lb (99.8 kg)  03/03/18 215 lb (97.5 kg)    Physical Exam  Constitutional: She is oriented to person, place, and time. She appears well-developed and well-nourished. No distress.  HENT:  Head: Normocephalic and atraumatic.  Right Ear: Hearing and external ear normal.  Left Ear: Hearing and external ear normal.  Nose: Nose normal.  Mouth/Throat: Oropharynx is clear and moist. No oropharyngeal exudate.  Eyes: Pupils are equal, round, and reactive to light. Conjunctivae, EOM and lids are normal. Right eye exhibits no discharge. Left eye exhibits no discharge. No scleral icterus.  Neck: Normal range of motion. Neck supple. No JVD present. No tracheal deviation present. No thyromegaly present.  Cardiovascular: Normal rate, regular rhythm, normal heart sounds and intact distal pulses. Exam reveals no gallop and no friction rub.  No murmur heard. Pulmonary/Chest: Effort normal and breath sounds normal. No stridor. No respiratory distress. She has no wheezes. She has no rales. She exhibits no tenderness.  Musculoskeletal: Normal range of motion.  Lymphadenopathy:    She has no cervical adenopathy.  Neurological: She is alert and oriented to person, place, and time.  Skin: Skin is warm, dry and intact. Capillary refill takes less than 2 seconds. Rash (small 0.5 inch urticaria all over) noted. She is not diaphoretic. No erythema. No pallor.  Psychiatric: She has a normal mood and affect. Her speech is normal  and behavior is normal. Judgment and thought content normal. Cognition and memory are normal.  Nursing note and vitals reviewed.   Results for orders placed or performed in visit on 01/06/18  UA/M w/rflx Culture, Routine  Result Value Ref Range   Specific Gravity, UA <1.005 (L) 1.005 - 1.030   pH, UA 6.5 5.0 - 7.5   Color, UA Straw Yellow   Appearance Ur Clear Clear   Leukocytes, UA Negative Negative   Protein, UA  Negative Negative/Trace   Glucose, UA Negative Negative   Ketones, UA Negative Negative   RBC, UA Negative Negative   Bilirubin, UA Negative Negative   Urobilinogen, Ur 0.2 0.2 - 1.0 mg/dL   Nitrite, UA Negative Negative  CBC With Differential/Platelet  Result Value Ref Range   WBC 10.4 3.4 - 10.8 x10E3/uL   RBC 4.79 3.77 - 5.28 x10E6/uL   Hemoglobin 12.2 11.1 - 15.9 g/dL   Hematocrit 37.2 34.0 - 46.6 %   MCV 78 (L) 79 - 97 fL   MCH 25.5 (L) 26.6 - 33.0 pg   MCHC 32.8 31.5 - 35.7 g/dL   RDW 16.9 (H) 12.3 - 15.4 %   Platelets 582 (H) 150 - 379 x10E3/uL   Neutrophils 67 Not Estab. %   Lymphs 26 Not Estab. %   MID 7 Not Estab. %   Neutrophils Absolute 7.0 1.4 - 7.0 x10E3/uL   Lymphocytes Absolute 2.7 0.7 - 3.1 x10E3/uL   MID (Absolute) 0.7 0.1 - 1.6 X10E3/uL      Assessment & Plan:   Problem List Items Addressed This Visit    None    Visit Diagnoses    Hives    -  Primary   Will prolong prednisone taper. Will continue zantac and benadryl and zyrtec and start PRN hydroxyzine during the day for itching. Recheck 1 week. Call w concern   Cough       Lungs clear. No more tussionex. Tessalon perles sent to her pharmacy.       Follow up plan: Return in about 1 week (around 03/14/2018) for follow up hives.

## 2018-03-17 ENCOUNTER — Ambulatory Visit: Payer: Managed Care, Other (non HMO) | Admitting: Family Medicine

## 2018-03-17 ENCOUNTER — Encounter: Payer: Self-pay | Admitting: Family Medicine

## 2018-03-17 VITALS — BP 133/84 | HR 98 | Wt 226.0 lb

## 2018-03-17 DIAGNOSIS — L509 Urticaria, unspecified: Secondary | ICD-10-CM

## 2018-03-17 DIAGNOSIS — R059 Cough, unspecified: Secondary | ICD-10-CM

## 2018-03-17 DIAGNOSIS — R05 Cough: Secondary | ICD-10-CM | POA: Diagnosis not present

## 2018-03-17 NOTE — Progress Notes (Signed)
BP 133/84   Pulse 98   Wt 226 lb (102.5 kg)   LMP 02/23/2018 (Approximate)   SpO2 98%   BMI 35.40 kg/m    Subjective:    Patient ID: Ashley Kidd, female    DOB: November 20, 1980, 37 y.o.   MRN: [id number]  HPI: Ashley Kidd is a 37 y.o. female  Chief Complaint  Patient presents with  . Follow-up    Hives- cleared no further episodes. Cough improveds   Hives and cough have cleared up. She is feeling better. Nothing else seems to be bothering her. No other concerns or complaints at this time.   Relevant past medical, surgical, family and social history reviewed and updated as indicated. Interim medical history since our last visit reviewed. Allergies and medications reviewed and updated.  Review of Systems  Constitutional: Negative.   Respiratory: Negative.   Cardiovascular: Negative.   Skin: Negative.   Neurological: Negative.   Psychiatric/Behavioral: Negative.     Per HPI unless specifically indicated above     Objective:    BP 133/84   Pulse 98   Wt 226 lb (102.5 kg)   LMP 02/23/2018 (Approximate)   SpO2 98%   BMI 35.40 kg/m   Wt Readings from Last 3 Encounters:  03/17/18 226 lb (102.5 kg)  03/07/18 224 lb 8 oz (101.8 kg)  03/04/18 220 lb (99.8 kg)    Physical Exam  Constitutional: She is oriented to person, place, and time. She appears well-developed and well-nourished. No distress.  HENT:  Head: Normocephalic and atraumatic.  Right Ear: Hearing and external ear normal.  Left Ear: Hearing and external ear normal.  Nose: Nose normal.  Mouth/Throat: Oropharynx is clear and moist.  Eyes: Pupils are equal, round, and reactive to light. Conjunctivae, EOM and lids are normal. Right eye exhibits no discharge. Left eye exhibits no discharge. No scleral icterus.  Neck: Normal range of motion. Neck supple. No JVD present. No tracheal deviation present. No thyromegaly present.  Cardiovascular: Normal rate, regular rhythm, normal heart sounds and intact distal  pulses. Exam reveals no gallop and no friction rub.  No murmur heard. Pulmonary/Chest: Effort normal and breath sounds normal. No stridor. No respiratory distress. She has no wheezes. She has no rales. She exhibits no tenderness.  Musculoskeletal: Normal range of motion.  Lymphadenopathy:    She has no cervical adenopathy.  Neurological: She is alert and oriented to person, place, and time.  Skin: Skin is warm, dry and intact. Capillary refill takes less than 2 seconds. No rash noted. She is not diaphoretic. No erythema. No pallor.  Psychiatric: She has a normal mood and affect. Her speech is normal and behavior is normal. Judgment and thought content normal. Cognition and memory are normal.  Nursing note and vitals reviewed.   Results for orders placed or performed in visit on 01/06/18  UA/M w/rflx Culture, Routine  Result Value Ref Range   Specific Gravity, UA <1.005 (L) 1.005 - 1.030   pH, UA 6.5 5.0 - 7.5   Color, UA Straw Yellow   Appearance Ur Clear Clear   Leukocytes, UA Negative Negative   Protein, UA Negative Negative/Trace   Glucose, UA Negative Negative   Ketones, UA Negative Negative   RBC, UA Negative Negative   Bilirubin, UA Negative Negative   Urobilinogen, Ur 0.2 0.2 - 1.0 mg/dL   Nitrite, UA Negative Negative  CBC With Differential/Platelet  Result Value Ref Range   WBC 10.4 3.4 - 10.8 x10E3/uL  RBC 4.79 3.77 - 5.28 x10E6/uL   Hemoglobin 12.2 11.1 - 15.9 g/dL   Hematocrit 37.2 34.0 - 46.6 %   MCV 78 (L) 79 - 97 fL   MCH 25.5 (L) 26.6 - 33.0 pg   MCHC 32.8 31.5 - 35.7 g/dL   RDW 16.9 (H) 12.3 - 15.4 %   Platelets 582 (H) 150 - 379 x10E3/uL   Neutrophils 67 Not Estab. %   Lymphs 26 Not Estab. %   MID 7 Not Estab. %   Neutrophils Absolute 7.0 1.4 - 7.0 x10E3/uL   Lymphocytes Absolute 2.7 0.7 - 3.1 x10E3/uL   MID (Absolute) 0.7 0.1 - 1.6 X10E3/uL      Assessment & Plan:   Problem List Items Addressed This Visit    None    Visit Diagnoses    Hives    -   Primary   Resolved on the prednisone. Continue to monitor. Finish prednisone. Call if not better.    Cough       Resolved on the prednisone. Continue to monitor. Finish prednisone. Call if not better.        Follow up plan: Return Within the next 6 months for a physical.

## 2018-03-20 ENCOUNTER — Other Ambulatory Visit: Payer: Self-pay | Admitting: Family Medicine

## 2018-03-22 NOTE — Telephone Encounter (Signed)
Interface refill request for Flonase:   LOV addressing flonase?    Last refill 5/38/19  Pharmacy  [facility]/ [address]  PCP  Rozann Lesches

## 2018-05-17 ENCOUNTER — Telehealth: Payer: Managed Care, Other (non HMO) | Admitting: Physician Assistant

## 2018-05-17 DIAGNOSIS — J019 Acute sinusitis, unspecified: Secondary | ICD-10-CM

## 2018-05-17 DIAGNOSIS — B9689 Other specified bacterial agents as the cause of diseases classified elsewhere: Secondary | ICD-10-CM

## 2018-05-17 MED ORDER — AMOXICILLIN-POT CLAVULANATE 875-125 MG PO TABS: 1.0000 | ORAL_TABLET | Freq: Two times a day (BID) | ORAL | 0 refills | Status: AC

## 2018-05-17 NOTE — Progress Notes (Signed)

## 2018-06-28 ENCOUNTER — Telehealth: Payer: Managed Care, Other (non HMO) | Admitting: Physician Assistant

## 2018-06-28 DIAGNOSIS — R509 Fever, unspecified: Secondary | ICD-10-CM

## 2018-06-28 NOTE — Progress Notes (Signed)
Based on what you shared with me it looks like you have a serious condition that should be evaluated in a face to face office visit.  NOTE: If you entered your credit card information for this eVisit, you will not be charged. You may see a "hold" on your card for the $30 but that hold will drop off and you will not have a charge processed.  If you are having a true medical emergency please call 911.  If you need an urgent face to face visit, [facility] has four urgent care centers for your convenience.  If you need care fast and have a high deductible or no insurance consider:   [email or web address] to reserve your spot online an avoid wait times  [facility] [address], [address] [place], [place] [address] 8 am to 8 pm Monday-Friday 10 am to 4 pm Saturday-Sunday *Across the street from [employer]  [facility] [place]  [address] [place], [address] 8 am to 5 pm Monday-Friday * In the [facility] on the [facility]   The following sites will take your  insurance:  . [facility]  [phone] Get Driving Directions Find a Provider at this Location  [address] [place], [place] [address] . 10 am to 8 pm Monday-Friday . 12 pm to 8 pm Saturday-Sunday   . [facility]  [phone] Get Driving Directions Find a Provider at this Location  [address], [address] [place], [place] [address] . 8 am to 8 pm Monday-Friday . 9 am to 6 pm Saturday . 11 am to 6 pm Sunday   . [facility] [place]  [phone] Get Driving Directions  [address] [place], [place] [address] . 8 am to 8 pm Monday-Friday . 8 am to 4 pm Saturday-Sunday   Your e-visit answers were reviewed by a board certified advanced clinical practitioner to complete your personal care plan.  Thank you for using e-Visits.  

## 2018-07-15 ENCOUNTER — Other Ambulatory Visit: Payer: Self-pay

## 2018-07-15 ENCOUNTER — Encounter: Payer: Self-pay | Admitting: Family Medicine

## 2018-07-15 ENCOUNTER — Ambulatory Visit (INDEPENDENT_AMBULATORY_CARE_PROVIDER_SITE_OTHER): Payer: Managed Care, Other (non HMO) | Admitting: Family Medicine

## 2018-07-15 VITALS — BP 150/95 | HR 88 | Temp 97.4°F | Ht 67.0 in | Wt 227.0 lb

## 2018-07-15 DIAGNOSIS — Z0000 Encounter for general adult medical examination without abnormal findings: Secondary | ICD-10-CM

## 2018-07-15 DIAGNOSIS — D485 Neoplasm of uncertain behavior of skin: Secondary | ICD-10-CM | POA: Diagnosis not present

## 2018-07-15 DIAGNOSIS — T7840XD Allergy, unspecified, subsequent encounter: Secondary | ICD-10-CM | POA: Diagnosis not present

## 2018-07-15 LAB — MICROSCOPIC EXAMINATION
Bacteria, UA: NONE SEEN
RBC, UA: NONE SEEN /hpf (ref 0–2)
WBC, UA: NONE SEEN /hpf (ref 0–5)

## 2018-07-15 LAB — UA/M W/RFLX CULTURE, ROUTINE
BILIRUBIN UA: NEGATIVE
GLUCOSE, UA: NEGATIVE
KETONES UA: NEGATIVE
Leukocytes, UA: NEGATIVE
Nitrite, UA: NEGATIVE
PROTEIN UA: NEGATIVE
UUROB: 0.2 mg/dL (ref 0.2–1.0)
pH, UA: 6.5 (ref 5.0–7.5)

## 2018-07-15 MED ORDER — ALBUTEROL SULFATE HFA 108 (90 BASE) MCG/ACT IN AERS: INHALATION_SPRAY | RESPIRATORY_TRACT | 6 refills | Status: DC

## 2018-07-15 MED ORDER — MONTELUKAST SODIUM 10 MG PO TABS: 10.0000 mg | ORAL_TABLET | Freq: Every day | ORAL | 1 refills | Status: DC

## 2018-07-15 NOTE — Progress Notes (Signed)
BP (!) 150/95   Pulse 88   Temp (!) 97.4 F (36.3 C) (Oral)   Ht 5\' 7"  (1.702 m)   Wt 227 lb (103 kg)   SpO2 98%   BMI 35.55 kg/m    Subjective:    Patient ID: Ashley Kidd, female    DOB: 12-23-1980, 37 y.o.   MRN: [id number]  HPI: Ashley Kidd is a 37 y.o. female presenting on 07/15/2018 for comprehensive medical examination. Current medical complaints include: seasonal allergies- taking flonase and zyrtec with no benefits.   Menopausal Symptoms: no  Depression Screen done today and results listed below:  Depression screen [facility] 2/9 07/15/2018 01/06/2018  Decreased Interest 1 1  Down, Depressed, Hopeless 0 1  PHQ - 2 Score 1 2  Altered sleeping 3 2  Tired, decreased energy 3 2  Change in appetite 1 2  Feeling bad or failure about yourself  0 0  Trouble concentrating 3 3  Moving slowly or fidgety/restless 1 0  Suicidal thoughts 0 0  PHQ-9 Score 12 11  Difficult doing work/chores Somewhat difficult -   Past Medical History:  Past Medical History:  Diagnosis Date  . Allergy   . Asthma   . Diabetes mellitus without complication ([place])   . GERD (gastroesophageal reflux disease)   . Migraine   . PCOS (polycystic ovarian syndrome)   . Sinus complaint     Surgical History:  Past Surgical History:  Procedure Laterality Date  . CHOLECYSTECTOMY  06-09-13  . OVARIAN CYST REMOVAL Bilateral 2002  . UPPER GI ENDOSCOPY  11-29-12   Dr Candace Cruise    Medications:  Current Outpatient Medications on File Prior to Visit  Medication Sig  . aspirin 81 MG tablet Take 81 mg by mouth daily.  . cetirizine (ZYRTEC) 10 MG tablet Take 10 mg by mouth daily.  . citalopram (CELEXA) 20 MG tablet Take 20 mg by mouth daily.  Marland Kitchen EPINEPHrine 0.3 mg/0.3 mL IJ SOAJ injection INJ 0.3 ML IM ONCE FOR 1 DOSE  . fluticasone (FLONASE) 50 MCG/ACT nasal spray INSTILL 2 SPRAYS IN EACH NOSTRIL EVERY DAY  . omeprazole (PRILOSEC) 20 MG capsule Take 20 mg by mouth daily.  . ondansetron (ZOFRAN ODT) 4 MG  disintegrating tablet Take 1 tablet (4 mg total) by mouth every 8 (eight) hours as needed.  . SUMAtriptan (IMITREX) 100 MG tablet Take 1 tablet (100 mg total) by mouth every 2 (two) hours as needed for migraine. One tab at onset and may repeat x1 in 1 hour. Max 200 mg/24 hours  . Vitamin D, Ergocalciferol, (DRISDOL) 50000 units CAPS capsule   . ZOVIA 1/35E, 28, 1-35 MG-MCG tablet Take by mouth daily.   No current facility-administered medications on file prior to visit.     Allergies:  Allergies  Allergen Reactions  . Hydrocodone Hives and Itching  . Tussionex Pennkinetic Er [Hydrocod Polst-Cpm Polst Er] Hives and Itching  . Bee Venom Hives    Social History:  Social History   Socioeconomic History  . Marital status: Married    Spouse name: Not on file  . Number of children: Not on file  . Years of education: Not on file  . Highest education level: Not on file  Occupational History  . Not on file  Social Needs  . Financial resource strain: Not on file  . Food insecurity:    Worry: Not on file    Inability: Not on file  . Transportation needs:    Medical: Not  on file    Non-medical: Not on file  Tobacco Use  . Smoking status: Former Smoker    Years: 12.00    Last attempt to quit: 08/24/2012    Years since quitting: 5.8  . Smokeless tobacco: Never Used  Substance and Sexual Activity  . Alcohol use: No    Alcohol/week: 0.0 standard drinks  . Drug use: No  . Sexual activity: Yes  Lifestyle  . Physical activity:    Days per week: Not on file    Minutes per session: Not on file  . Stress: Not on file  Relationships  . Social connections:    Talks on phone: Not on file    Gets together: Not on file    Attends religious service: Not on file    Active member of club or organization: Not on file    Attends meetings of clubs or organizations: Not on file    Relationship status: Not on file  . Intimate partner violence:    Fear of current or ex partner: Not on file     Emotionally abused: Not on file    Physically abused: Not on file    Forced sexual activity: Not on file  Other Topics Concern  . Not on file  Social History Narrative  . Not on file   Social History   Tobacco Use  Smoking Status Former Smoker  . Years: 12.00  . Last attempt to quit: 08/24/2012  . Years since quitting: 5.8  Smokeless Tobacco Never Used   Social History   Substance and Sexual Activity  Alcohol Use No  . Alcohol/week: 0.0 standard drinks    Family History:  Family History  Problem Relation Age of Onset  . Diabetes Mother   . Asthma Mother   . Diabetes Father   . Heart attack Father   . Heart disease Father   . Asthma Sister   . Bipolar disorder Sister   . Asthma Brother   . Bipolar disorder Brother   . Epilepsy Brother     Past medical history, surgical history, medications, allergies, family history and social history reviewed with patient today and changes made to appropriate areas of the chart.   Review of Systems  Constitutional: Positive for diaphoresis. Negative for chills, fever, malaise/fatigue and weight loss.  HENT: Positive for congestion. Negative for ear discharge, ear pain, hearing loss, nosebleeds, sinus pain, sore throat and tinnitus.   Eyes: Negative.   Respiratory: Positive for cough, shortness of breath and wheezing. Negative for hemoptysis, sputum production and stridor.   Cardiovascular: Negative.   Gastrointestinal: Positive for heartburn. Negative for abdominal pain, blood in stool, constipation, diarrhea, melena, nausea and vomiting.  Genitourinary: Negative.   Musculoskeletal: Negative.   Skin: Negative.   Neurological: Negative.   Endo/Heme/Allergies: Positive for environmental allergies and polydipsia. Does not bruise/bleed easily.  Psychiatric/Behavioral: Positive for depression. Negative for hallucinations, memory loss, substance abuse and suicidal ideas. The patient is nervous/anxious. The patient does not have  insomnia.     All other ROS negative except what is listed above and in the HPI.      Objective:    BP (!) 150/95   Pulse 88   Temp (!) 97.4 F (36.3 C) (Oral)   Ht 5\' 7"  (1.702 m)   Wt 227 lb (103 kg)   SpO2 98%   BMI 35.55 kg/m   Wt Readings from Last 3 Encounters:  07/15/18 227 lb (103 kg)  03/17/18 226 lb (102.5 kg)  03/07/18 224 lb 8 oz (101.8 kg)    Physical Exam  Constitutional: She is oriented to person, place, and time. She appears well-developed and well-nourished. No distress.  HENT:  Head: Normocephalic and atraumatic.  Right Ear: Hearing, tympanic membrane, external ear and ear canal normal.  Left Ear: Hearing, tympanic membrane, external ear and ear canal normal.  Nose: Nose normal.  Mouth/Throat: Uvula is midline, oropharynx is clear and moist and mucous membranes are normal. No oropharyngeal exudate.  Eyes: Pupils are equal, round, and reactive to light. Conjunctivae, EOM and lids are normal. Right eye exhibits no discharge. Left eye exhibits no discharge. No scleral icterus.  Neck: Normal range of motion. Neck supple. No JVD present. No tracheal deviation present. No thyromegaly present.  Cardiovascular: Normal rate, regular rhythm, normal heart sounds and intact distal pulses. Exam reveals no gallop and no friction rub.  No murmur heard. Pulmonary/Chest: Effort normal and breath sounds normal. No stridor. No respiratory distress. She has no wheezes. She has no rales. She exhibits no tenderness.  Abdominal: Soft. Bowel sounds are normal. She exhibits no distension and no mass. There is no tenderness. There is no rebound and no guarding. No hernia.  Genitourinary:  Genitourinary Comments: Breast and pelvic deferred- done at GYN  Musculoskeletal: Normal range of motion. She exhibits no edema, tenderness or deformity.  Lymphadenopathy:    She has no cervical adenopathy.  Neurological: She is alert and oriented to person, place, and time. She displays normal  reflexes. No cranial nerve deficit or sensory deficit. She exhibits normal muscle tone. Coordination normal.  Skin: Skin is warm, dry and intact. Capillary refill takes less than 2 seconds. No rash noted. She is not diaphoretic. No erythema. No pallor.  1/2 cm irregular hyperpigmented raised mole with areas of darker pigmentation in it on anterior R chest  Psychiatric: She has a normal mood and affect. Her speech is normal and behavior is normal. Judgment and thought content normal. Cognition and memory are normal.  Nursing note and vitals reviewed.   Results for orders placed or performed in visit on 01/06/18  UA/M w/rflx Culture, Routine  Result Value Ref Range   Specific Gravity, UA <1.005 (L) 1.005 - 1.030   pH, UA 6.5 5.0 - 7.5   Color, UA Straw Yellow   Appearance Ur Clear Clear   Leukocytes, UA Negative Negative   Protein, UA Negative Negative/Trace   Glucose, UA Negative Negative   Ketones, UA Negative Negative   RBC, UA Negative Negative   Bilirubin, UA Negative Negative   Urobilinogen, Ur 0.2 0.2 - 1.0 mg/dL   Nitrite, UA Negative Negative  CBC With Differential/Platelet  Result Value Ref Range   WBC 10.4 3.4 - 10.8 x10E3/uL   RBC 4.79 3.77 - 5.28 x10E6/uL   Hemoglobin 12.2 11.1 - 15.9 g/dL   Hematocrit 37.2 34.0 - 46.6 %   MCV 78 (L) 79 - 97 fL   MCH 25.5 (L) 26.6 - 33.0 pg   MCHC 32.8 31.5 - 35.7 g/dL   RDW 16.9 (H) 12.3 - 15.4 %   Platelets 582 (H) 150 - 379 x10E3/uL   Neutrophils 67 Not Estab. %   Lymphs 26 Not Estab. %   MID 7 Not Estab. %   Neutrophils Absolute 7.0 1.4 - 7.0 x10E3/uL   Lymphocytes Absolute 2.7 0.7 - 3.1 x10E3/uL   MID (Absolute) 0.7 0.1 - 1.6 X10E3/uL      Assessment & Plan:   Problem List Items Addressed This Visit  Other   Allergy    Not under good control. Will start singulair. Call with any concerns or if not getting better.        Other Visit Diagnoses    Routine general medical examination at a health care facility    -   Primary   Vaccines up to date. Screening labs checked today. Pap done at GYN. Call with any concerns. Continue diet and exercise.    Relevant Orders   CBC with Differential/Platelet   Comprehensive metabolic panel   Lipid Panel w/o Chol/HDL Ratio   TSH   UA/M w/rflx Culture, Routine   Neoplasm of uncertain behavior of skin       Will have her return ASAP for mole removal.       Follow up plan: Return in about 6 months (around 01/14/2019), or ASAP for mole removal, then 6 months, for follow up records release [facility] for pap .   LABORATORY TESTING:  - Pap smear: up to date- done at [facility]  IMMUNIZATIONS:   - Tdap: Tetanus vaccination status reviewed: last tetanus booster within 10 years. - Influenza: Refused - Pneumovax: Up to date - Prevnar: Not applicable - HPV: Refused  PATIENT COUNSELING:   Advised to take 1 mg of folate supplement per day if capable of pregnancy.   Sexuality: Discussed sexually transmitted diseases, partner selection, use of condoms, avoidance of unintended pregnancy  and contraceptive alternatives.   Advised to avoid cigarette smoking.  I discussed with the patient that most people either abstain from alcohol or drink within safe limits (<=14/week and <=4 drinks/occasion for males, <=7/weeks and <= 3 drinks/occasion for females) and that the risk for alcohol disorders and other health effects rises proportionally with the number of drinks per week and how often a drinker exceeds daily limits.  Discussed cessation/primary prevention of drug use and availability of treatment for abuse.   Diet: Encouraged to adjust caloric intake to maintain  or achieve ideal body weight, to reduce intake of dietary saturated fat and total fat, to limit sodium intake by avoiding high sodium foods and not adding table salt, and to maintain adequate dietary potassium and calcium preferably from fresh fruits, vegetables, and low-fat dairy products.    stressed the  importance of regular exercise  Injury prevention: Discussed safety belts, safety helmets, smoke detector, smoking near bedding or upholstery.   Dental health: Discussed importance of regular tooth brushing, flossing, and dental visits.    NEXT PREVENTATIVE PHYSICAL DUE IN 1 YEAR. Return in about 6 months (around 01/14/2019), or ASAP for mole removal, then 6 months, for follow up records release [facility] for pap .

## 2018-07-15 NOTE — Patient Instructions (Signed)

## 2018-07-15 NOTE — Assessment & Plan Note (Signed)
Not under good control. Will start singulair. Call with any concerns or if not getting better.

## 2018-07-16 LAB — COMPREHENSIVE METABOLIC PANEL
ALBUMIN: 3.9 g/dL (ref 3.5–5.5)
ALK PHOS: 129 IU/L — AB (ref 39–117)
ALT: 48 IU/L — AB (ref 0–32)
AST: 118 IU/L — ABNORMAL HIGH (ref 0–40)
Albumin/Globulin Ratio: 1.4 (ref 1.2–2.2)
BILIRUBIN TOTAL: 0.2 mg/dL (ref 0.0–1.2)
BUN/Creatinine Ratio: 8 — ABNORMAL LOW (ref 9–23)
BUN: 6 mg/dL (ref 6–20)
CHLORIDE: 99 mmol/L (ref 96–106)
CO2: 21 mmol/L (ref 20–29)
Calcium: 9.3 mg/dL (ref 8.7–10.2)
Creatinine, Ser: 0.73 mg/dL (ref 0.57–1.00)
GFR calc non Af Amer: 106 mL/min/{1.73_m2} (ref >59)
GFR, EST AFRICAN AMERICAN: 122 mL/min/{1.73_m2} (ref >59)
GLUCOSE: 180 mg/dL — AB (ref 65–99)
Globulin, Total: 2.7 g/dL (ref 1.5–4.5)
Potassium: 4.6 mmol/L (ref 3.5–5.2)
Sodium: 138 mmol/L (ref 134–144)
Total Protein: 6.6 g/dL (ref 6.0–8.5)

## 2018-07-16 LAB — CBC WITH DIFFERENTIAL/PLATELET
BASOS ABS: 0 10*3/uL (ref 0.0–0.2)
Basos: 0 %
EOS (ABSOLUTE): 0.2 10*3/uL (ref 0.0–0.4)
Eos: 2 %
HEMOGLOBIN: 11.9 g/dL (ref 11.1–15.9)
Hematocrit: 38 % (ref 34.0–46.6)
Immature Grans (Abs): 0.1 10*3/uL (ref 0.0–0.1)
Immature Granulocytes: 1 %
LYMPHS ABS: 2.2 10*3/uL (ref 0.7–3.1)
Lymphs: 25 %
MCH: 24.3 pg — AB (ref 26.6–33.0)
MCHC: 31.3 g/dL — AB (ref 31.5–35.7)
MCV: 78 fL — ABNORMAL LOW (ref 79–97)
MONOS ABS: 0.4 10*3/uL (ref 0.1–0.9)
Monocytes: 5 %
NEUTROS ABS: 6 10*3/uL (ref 1.4–7.0)
Neutrophils: 67 %
Platelets: 557 10*3/uL — ABNORMAL HIGH (ref 150–450)
RBC: 4.89 x10E6/uL (ref 3.77–5.28)
RDW: 14.4 % (ref 12.3–15.4)
WBC: 8.9 10*3/uL (ref 3.4–10.8)

## 2018-07-16 LAB — LIPID PANEL W/O CHOL/HDL RATIO
CHOLESTEROL TOTAL: 227 mg/dL — AB (ref 100–199)
HDL: 38 mg/dL — ABNORMAL LOW (ref >39)
LDL Calculated: 135 mg/dL — ABNORMAL HIGH (ref 0–99)
TRIGLYCERIDES: 269 mg/dL — AB (ref 0–149)
VLDL CHOLESTEROL CAL: 54 mg/dL — AB (ref 5–40)

## 2018-07-16 LAB — TSH: TSH: 2.41 u[IU]/mL (ref 0.450–4.500)

## 2018-07-30 ENCOUNTER — Ambulatory Visit
Admission: EM | Admit: 2018-07-30 | Discharge: 2018-07-30 | Disposition: A | Discharge status: Home / Self Care | Payer: Managed Care, Other (non HMO) | Attending: Family Medicine | Admitting: Family Medicine

## 2018-07-30 ENCOUNTER — Other Ambulatory Visit: Payer: Self-pay

## 2018-07-30 DIAGNOSIS — J988 Other specified respiratory disorders: Secondary | ICD-10-CM

## 2018-07-30 DIAGNOSIS — B9789 Other viral agents as the cause of diseases classified elsewhere: Secondary | ICD-10-CM | POA: Diagnosis not present

## 2018-07-30 MED ORDER — PREDNISONE 50 MG PO TABS: ORAL_TABLET | ORAL | 0 refills | Status: DC

## 2018-07-30 NOTE — ED Provider Notes (Signed)
[facility]    CSN: [id number] Arrival date & time: 07/30/18  1233  History   Chief Complaint Chief Complaint  Patient presents with  . Cough   HPI  37 year old female with a history of asthma presents with cough and chest tightness.  Started abruptly last night.  1 of her children has been sick.  Patient reports cough and chest tightness.  Associated pain.  Patient states that she has taken albuterol with improvement.  No fever.  No chills.  Patient denies congestion although she reported congestion to the nurse.  No known exacerbating factors.  No other associated symptoms.  No other complaints.  PMH, Surgical Hx, Family Hx, Social History reviewed and updated as below.  Past Medical History:  Diagnosis Date  . Allergy   . Asthma   . GERD (gastroesophageal reflux disease)   . Migraine   . PCOS (polycystic ovarian syndrome)   . Sinus complaint     Patient Active Problem List   Diagnosis Date Noted  . C. difficile diarrhea 12/24/2015  . Migraine   . Allergy   . Asthma   . Low back pain 03/26/2015  . Chronic cholecystitis without calculus 05/19/2013    Past Surgical History:  Procedure Laterality Date  . CHOLECYSTECTOMY  06-09-13  . OVARIAN CYST REMOVAL Bilateral 2002  . UPPER GI ENDOSCOPY  11-29-12   Dr Candace Cruise    OB History    Gravida  0   Para  0   Term  0   Preterm  0   AB  0   Living  0     SAB  0   TAB  0   Ectopic  0   Multiple  0   Live Births           Obstetric Comments  1st Menstrual Cycle: 13 1st Pregnancy:  NA         Home Medications    Prior to Admission medications   Medication Sig Start Date End Date Taking? Authorizing Provider  albuterol (VENTOLIN HFA) 108 (90 Base) MCG/ACT inhaler INHALE 2 PUFFS INTO THE LUNGS EVERY 6 HOURS AS NEEDED FOR WHEEZING 07/15/18   Johnson, Megan P, DO  aspirin 81 MG tablet Take 81 mg by mouth daily.    [provider]  cetirizine (ZYRTEC) 10 MG tablet Take 10 mg by mouth  daily.    [provider]  citalopram (CELEXA) 20 MG tablet Take 20 mg by mouth daily.    [provider]  EPINEPHrine 0.3 mg/0.3 mL IJ SOAJ injection INJ 0.3 ML IM ONCE FOR 1 DOSE 03/04/18   [provider]  fluticasone (FLONASE) 50 MCG/ACT nasal spray INSTILL 2 SPRAYS IN EACH NOSTRIL EVERY DAY 03/22/18   Johnson, Megan P, DO  montelukast (SINGULAIR) 10 MG tablet Take 1 tablet (10 mg total) by mouth at bedtime. 07/15/18   Johnson, Megan P, DO  omeprazole (PRILOSEC) 20 MG capsule Take 20 mg by mouth daily.    [provider]  ondansetron (ZOFRAN ODT) 4 MG disintegrating tablet Take 1 tablet (4 mg total) by mouth every 8 (eight) hours as needed. 01/06/18   Volney American, PA-C  predniSONE (DELTASONE) 50 MG tablet 1 tablet daily x 5 days 07/30/18   Coral Spikes, DO  SUMAtriptan (IMITREX) 100 MG tablet Take 1 tablet (100 mg total) by mouth every 2 (two) hours as needed for migraine. One tab at onset and may repeat x1 in 1 hour. Max 200  mg/24 hours 10/26/17   Melynda Ripple, MD  Vitamin D, Ergocalciferol, (DRISDOL) 50000 units CAPS capsule  05/19/18   [provider]  ZOVIA 1/35E, 28, 1-35 MG-MCG tablet Take by mouth daily. 05/03/16   [provider]    Family History Family History  Problem Relation Age of Onset  . Diabetes Mother   . Asthma Mother   . Diabetes Father   . Heart attack Father   . Heart disease Father   . Asthma Sister   . Bipolar disorder Sister   . Asthma Brother   . Bipolar disorder Brother   . Epilepsy Brother     Social History Social History   Tobacco Use  . Smoking status: Former Smoker    Years: 12.00    Last attempt to quit: 08/24/2012    Years since quitting: 5.9  . Smokeless tobacco: Never Used  Substance Use Topics  . Alcohol use: No    Alcohol/week: 0.0 standard drinks  . Drug use: No     Allergies   Hydrocodone; Tussionex pennkinetic er [employer] polst-cpm polst er]; and Bee  venom   Review of Systems Review of Systems  Constitutional: Positive for fever.  Respiratory: Positive for cough and chest tightness.    Physical Exam Triage Vital Signs ED Triage Vitals [07/30/18 1246]  Enc Vitals Group     BP 140/88     Pulse Rate 97     Resp 20     Temp 97.7 F (36.5 C)     Temp Source Oral     SpO2 99 %     Weight 225 lb (102.1 kg)     Height 5\' 7"  (1.702 m)     Head Circumference      Peak Flow      Pain Score 3     Pain Loc      Pain Edu?      Excl. in [place]?    Updated Vital Signs BP 140/88 (BP Location: Right Arm)   Pulse 97   Temp 97.7 F (36.5 C) (Oral)   Resp 20   Ht 5\' 7"  (1.702 m)   Wt 102.1 kg   LMP 07/17/2018 (Approximate)   SpO2 99%   BMI 35.24 kg/m   Visual Acuity Right Eye Distance:   Left Eye Distance:   Bilateral Distance:    Right Eye Near:   Left Eye Near:    Bilateral Near:     Physical Exam  Constitutional: She is oriented to person, place, and time. She appears well-developed. No distress.  HENT:  Head: Normocephalic and atraumatic.  Mouth/Throat: Oropharynx is clear and moist.  Cardiovascular: Normal rate and regular rhythm.  Pulmonary/Chest: Effort normal and breath sounds normal. She has no wheezes. She has no rales.  Neurological: She is alert and oriented to person, place, and time.  Psychiatric: She has a normal mood and affect. Her behavior is normal.  Nursing note and vitals reviewed.  UC Treatments / Results  Labs (all labs ordered are listed, but only abnormal results are displayed) Labs Reviewed - No data to display  EKG None  Radiology No results found.  Procedures Procedures (including critical care time)  Medications Ordered in UC Medications - No data to display  Initial Impression / Assessment and Plan / UC Course  I have reviewed the triage vital signs and the nursing notes.  Pertinent labs & imaging results that were available during my care of the patient were reviewed by me and  considered in my medical decision making (see chart for details).    37 year old female presents with viral respiratory infection.  Supportive care.  Prednisone to aid in her symptoms given the fact that she has asthma.  Final Clinical Impressions(s) / UC Diagnoses   Final diagnoses:  Viral respiratory infection     Discharge Instructions     Prednisone as prescribed.  Albuterol as needed.  Take care  Dr. Lacinda Axon    ED Prescriptions    Medication Sig Dispense Auth. Provider   predniSONE (DELTASONE) 50 MG tablet 1 tablet daily x 5 days 5 tablet Coral Spikes, DO     Controlled Substance Prescriptions [place] Controlled Substance Registry consulted? Not Applicable   Coral Spikes, DO 07/30/18 1309

## 2018-07-30 NOTE — ED Triage Notes (Signed)
Pt states she started having cough with congestion last night and has been using her inhaler. States one of her children has pneumonia with bronchitis and is trying to catch it early,

## 2018-07-30 NOTE — Discharge Instructions (Signed)
Prednisone as prescribed.  Albuterol as needed.  Take care  Dr. Lacinda Axon

## 2018-08-08 ENCOUNTER — Other Ambulatory Visit (HOSPITAL_COMMUNITY)
Admission: RE | Admit: 2018-08-08 | Discharge: 2018-08-08 | Disposition: A | Discharge status: Home / Self Care | Payer: Managed Care, Other (non HMO) | Source: Ambulatory Visit | Attending: Family Medicine | Admitting: Family Medicine

## 2018-08-08 ENCOUNTER — Encounter: Payer: Self-pay | Admitting: Family Medicine

## 2018-08-08 ENCOUNTER — Ambulatory Visit: Payer: Managed Care, Other (non HMO) | Admitting: Family Medicine

## 2018-08-08 VITALS — BP 143/83 | HR 97 | Temp 98.1°F | Ht 67.0 in | Wt 224.6 lb

## 2018-08-08 DIAGNOSIS — J0100 Acute maxillary sinusitis, unspecified: Secondary | ICD-10-CM

## 2018-08-08 DIAGNOSIS — D485 Neoplasm of uncertain behavior of skin: Secondary | ICD-10-CM | POA: Diagnosis present

## 2018-08-08 MED ORDER — AMOXICILLIN-POT CLAVULANATE 875-125 MG PO TABS: 1.0000 | ORAL_TABLET | Freq: Two times a day (BID) | ORAL | 0 refills | Status: DC

## 2018-08-08 MED ORDER — PREDNISONE 50 MG PO TABS: 50.0000 mg | ORAL_TABLET | Freq: Every day | ORAL | 0 refills | Status: DC

## 2018-08-08 NOTE — Progress Notes (Signed)
BP (!) 143/83   Pulse 97   Temp 98.1 F (36.7 C) (Oral)   Ht 5\' 7"  (1.702 m)   Wt 224 lb 9.6 oz (101.9 kg)   LMP 07/17/2018 (Approximate)   SpO2 96%   BMI 35.18 kg/m    Subjective:    Patient ID: Ashley Kidd, female    DOB: 1981-05-05, 37 y.o.   MRN: [id number]  HPI: Ashley Kidd is a 37 y.o. female  Chief Complaint  Patient presents with  . Nevus    mole removal   SKIN LESION Duration: months Location: R chest Painful: no Itching: no Onset: gradual Context: changing Associated signs and symptoms: none History of skin cancer: no History of precancerous skin lesions: no Family history of skin cancer: no  UPPER RESPIRATORY TRACT INFECTION Duration: 1.5 weeks Worst symptom: cough and congestion Fever: no Cough: yes Shortness of breath: yes Wheezing: yes Chest pain: yes, with cough Chest tightness: yes Chest congestion: yes Nasal congestion: yes Runny nose: yes Post nasal drip: yes Sneezing: yes Sore throat: yes Swollen glands: no Sinus pressure: no Headache: yes Face pain: no Toothache: no Ear pain: no  Ear pressure: no  Eyes red/itching:no Eye drainage/crusting: no  Vomiting: no Rash: no Fatigue: yes Sick contacts: no Strep contacts: no  Context: worse Recurrent sinusitis: no Relief with OTC cold/cough medications: no  Treatments attempted: prednisone   Relevant past medical, surgical, family and social history reviewed and updated as indicated. Interim medical history since our last visit reviewed. Allergies and medications reviewed and updated.  Review of Systems  Constitutional: Negative.   HENT: Positive for congestion, postnasal drip, rhinorrhea and sore throat. Negative for dental problem, drooling, ear discharge, ear pain, facial swelling, hearing loss, mouth sores, nosebleeds, sinus pressure, sinus pain, sneezing, tinnitus, trouble swallowing and voice change.   Respiratory: Positive for cough, chest tightness, shortness of  breath and wheezing. Negative for apnea, choking and stridor.   Cardiovascular: Negative for chest pain, palpitations and leg swelling.  Gastrointestinal: Negative.   Psychiatric/Behavioral: Negative.     Per HPI unless specifically indicated above     Objective:    BP (!) 143/83   Pulse 97   Temp 98.1 F (36.7 C) (Oral)   Ht 5\' 7"  (1.702 m)   Wt 224 lb 9.6 oz (101.9 kg)   LMP 07/17/2018 (Approximate)   SpO2 96%   BMI 35.18 kg/m   Wt Readings from Last 3 Encounters:  08/08/18 224 lb 9.6 oz (101.9 kg)  07/30/18 225 lb (102.1 kg)  07/15/18 227 lb (103 kg)    Physical Exam  Constitutional: She is oriented to person, place, and time. She appears well-developed and well-nourished. No distress.  HENT:  Head: Normocephalic and atraumatic.  Right Ear: Hearing, tympanic membrane, external ear and ear canal normal.  Left Ear: Hearing, tympanic membrane, external ear and ear canal normal.  Nose: Mucosal edema and rhinorrhea present.  Mouth/Throat: Uvula is midline, oropharynx is clear and moist and mucous membranes are normal. No oropharyngeal exudate.  Eyes: Pupils are equal, round, and reactive to light. Conjunctivae, EOM and lids are normal. Right eye exhibits no discharge. Left eye exhibits no discharge. No scleral icterus.  Neck: Normal range of motion. No JVD present. No tracheal deviation present. No thyromegaly present.  Cardiovascular: Normal rate, regular rhythm, normal heart sounds and intact distal pulses. Exam reveals no gallop and no friction rub.  No murmur heard. Pulmonary/Chest: Effort normal and breath sounds normal. No stridor. No  respiratory distress. She has no wheezes. She has no rales. She exhibits no tenderness.  Musculoskeletal: Normal range of motion.  Lymphadenopathy:    She has no cervical adenopathy.  Neurological: She is alert and oriented to person, place, and time.  Skin: Skin is warm, dry and intact. Capillary refill takes less than 2 seconds. No rash  noted. She is not diaphoretic. No erythema. No pallor.  1/2 cm irregular hyperpigmented raised mole with areas of darker pigmentation in it on anterior R chest   Psychiatric: She has a normal mood and affect. Her speech is normal and behavior is normal. Judgment and thought content normal. Cognition and memory are normal.    Results for orders placed or performed in visit on 07/15/18  Microscopic Examination  Result Value Ref Range   WBC, UA None seen 0 - 5 /hpf   RBC, UA None seen 0 - 2 /hpf   Epithelial Cells (non renal) 0-10 0 - 10 /hpf   Bacteria, UA None seen None seen/Few  CBC with Differential/Platelet  Result Value Ref Range   WBC 8.9 3.4 - 10.8 x10E3/uL   RBC 4.89 3.77 - 5.28 x10E6/uL   Hemoglobin 11.9 11.1 - 15.9 g/dL   Hematocrit 38.0 34.0 - 46.6 %   MCV 78 (L) 79 - 97 fL   MCH 24.3 (L) 26.6 - 33.0 pg   MCHC 31.3 (L) 31.5 - 35.7 g/dL   RDW 14.4 12.3 - 15.4 %   Platelets 557 (H) 150 - 450 x10E3/uL   Neutrophils 67 Not Estab. %   Lymphs 25 Not Estab. %   Monocytes 5 Not Estab. %   Eos 2 Not Estab. %   Basos 0 Not Estab. %   Neutrophils Absolute 6.0 1.4 - 7.0 x10E3/uL   Lymphocytes Absolute 2.2 0.7 - 3.1 x10E3/uL   Monocytes Absolute 0.4 0.1 - 0.9 x10E3/uL   EOS (ABSOLUTE) 0.2 0.0 - 0.4 x10E3/uL   Basophils Absolute 0.0 0.0 - 0.2 x10E3/uL   Immature Granulocytes 1 Not Estab. %   Immature Grans (Abs) 0.1 0.0 - 0.1 x10E3/uL  Comprehensive metabolic panel  Result Value Ref Range   Glucose 180 (H) 65 - 99 mg/dL   BUN 6 6 - 20 mg/dL   Creatinine, Ser 0.73 0.57 - 1.00 mg/dL   GFR calc non Af Amer 106 >59 mL/min/1.73   GFR calc Af Amer 122 >59 mL/min/1.73   BUN/Creatinine Ratio 8 (L) 9 - 23   Sodium 138 134 - 144 mmol/L   Potassium 4.6 3.5 - 5.2 mmol/L   Chloride 99 96 - 106 mmol/L   CO2 21 20 - 29 mmol/L   Calcium 9.3 8.7 - 10.2 mg/dL   Total Protein 6.6 6.0 - 8.5 g/dL   Albumin 3.9 3.5 - 5.5 g/dL   Globulin, Total 2.7 1.5 - 4.5 g/dL   Albumin/Globulin Ratio 1.4  1.2 - 2.2   Bilirubin Total 0.2 0.0 - 1.2 mg/dL   Alkaline Phosphatase 129 (H) 39 - 117 IU/L   AST 118 (H) 0 - 40 IU/L   ALT 48 (H) 0 - 32 IU/L  Lipid Panel w/o Chol/HDL Ratio  Result Value Ref Range   Cholesterol, Total 227 (H) 100 - 199 mg/dL   Triglycerides 269 (H) 0 - 149 mg/dL   HDL 38 (L) >39 mg/dL   VLDL Cholesterol Cal 54 (H) 5 - 40 mg/dL   LDL Calculated 135 (H) 0 - 99 mg/dL  TSH  Result Value Ref Range  TSH 2.410 0.450 - 4.500 uIU/mL  UA/M w/rflx Culture, Routine  Result Value Ref Range   Specific Gravity, UA <1.005 (L) 1.005 - 1.030   pH, UA 6.5 5.0 - 7.5   Color, UA Yellow Yellow   Appearance Ur Clear Clear   Leukocytes, UA Negative Negative   Protein, UA Negative Negative/Trace   Glucose, UA Negative Negative   Ketones, UA Negative Negative   RBC, UA 2+ (A) Negative   Bilirubin, UA Negative Negative   Urobilinogen, Ur 0.2 0.2 - 1.0 mg/dL   Nitrite, UA Negative Negative   Microscopic Examination See below:       Assessment & Plan:   Problem List Items Addressed This Visit    None    Visit Diagnoses    Neoplasm of uncertain behavior of skin    -  Primary   Removed today as below.   Relevant Orders   Dermatology pathology   Acute non-recurrent maxillary sinusitis       Will treat with augmentin and prednisone. Call if not getting better or getting worse.    Relevant Medications   predniSONE (DELTASONE) 50 MG tablet   amoxicillin-clavulanate (AUGMENTIN) 875-125 MG tablet      Skin Procedure  Procedure: Informed consent given.  Sterile prep of the area.  Area infiltrated with lidocaine with epinephrine.  Using a surgical blade, part of the upper dermis shaved off and sent  for pathology.  Area cauterized. Pt ed on scarring.     Diagnosis:   ICD-10-CM   1. Neoplasm of uncertain behavior of skin D48.5 Dermatology pathology   Removed today as below.  2. Acute non-recurrent maxillary sinusitis J01.00    Will treat with augmentin and prednisone. Call if  not getting better or getting worse.     Lesion Location/Size: 1/2 cm irregular hyperpigmented raised mole with areas of darker pigmentation in it on anterior R chest  Physician: MJ Consent:  Risks, benefits, and alternative treatments discussed and all questions were answered.  Patient elected to proceed and verbal consent obtained.  Description: Area prepped and draped using semi-sterile technique. Area locally anesthetized using 2 cc's of lidocaine 2% with epi. Shave biopsy of lesion performed using a dermablade.  Adequate hemostastis achieved using Silver Nitrate Wound dressed after application of bacitracin ointment. Post Procedure Instructions: Wound care instructions discussed and patient was instructed to keep area clean and dry.  Signs and symptoms of infection discussed, patient agrees to contact the office ASAP should they occur.  Dressing change recommended every other day.   Follow up plan: Return if symptoms worsen or fail to improve.

## 2018-08-12 ENCOUNTER — Telehealth: Payer: Self-pay | Admitting: Family Medicine

## 2018-08-12 NOTE — Telephone Encounter (Signed)
Please let her know that her mole was just a mole. Thanks!

## 2018-08-12 NOTE — Telephone Encounter (Signed)
Patient notified

## 2018-11-11 ENCOUNTER — Telehealth: Payer: Managed Care, Other (non HMO) | Admitting: Family

## 2018-11-11 DIAGNOSIS — J019 Acute sinusitis, unspecified: Secondary | ICD-10-CM

## 2018-11-11 MED ORDER — AMOXICILLIN-POT CLAVULANATE 875-125 MG PO TABS: 1.0000 | ORAL_TABLET | Freq: Two times a day (BID) | ORAL | 0 refills | Status: DC

## 2018-11-11 NOTE — Progress Notes (Signed)
We are sorry that you are not feeling well.  Here is how we plan to help!  Based on what you have shared with me it looks like you have sinusitis.  Sinusitis is inflammation and infection in the sinus cavities of the head.  Based on your presentation I believe you most likely have Acute Bacterial Sinusitis.  This is an infection caused by bacteria and is treated with antibiotics. I have prescribed Augmentin 875mg /125mg  one tablet twice daily with food, for 7 days. You may use an oral decongestant such as Mucinex D or if you have glaucoma or high blood pressure use plain Mucinex. Saline nasal spray help and can safely be used as often as needed for congestion.  If you develop worsening sinus pain, fever or notice severe headache and vision changes, or if symptoms are not better after completion of antibiotic, please schedule an appointment with a health care provider.     Approx 5 mins was spent reviewing patient's medical record and documenting.   Sinus infections are not as easily transmitted as other respiratory infection, however we still recommend that you avoid close contact with loved ones, especially the very young and elderly.  Remember to wash your hands thoroughly throughout the day as this is the number one way to prevent the spread of infection!  Home Care:  Only take medications as instructed by your medical team.  Complete the entire course of an antibiotic.  Do not take these medications with alcohol.  A steam or ultrasonic humidifier can help congestion.  You can place a towel over your head and breathe in the steam from hot water coming from a faucet.  Avoid close contacts especially the very young and the elderly.  Cover your mouth when you cough or sneeze.  Always remember to wash your hands.  Get Help Right Away If:  You develop worsening fever or sinus pain.  You develop a severe head ache or visual changes.  Your symptoms persist after you have completed your  treatment plan.  Make sure you  Understand these instructions.  Will watch your condition.  Will get help right away if you are not doing well or get worse.  Your e-visit answers were reviewed by a board certified advanced clinical practitioner to complete your personal care plan.  Depending on the condition, your plan could have included both over the counter or prescription medications.  If there is a problem please reply  once you have received a response from your provider.  Your safety is important to [place].  If you have drug allergies check your prescription carefully.    You can use MyChart to ask questions about today's visit, request a non-urgent call back, or ask for a work or school excuse for 24 hours related to this e-Visit. If it has been greater than 24 hours you will need to follow up with your provider, or enter a new e-Visit to address those concerns.  You will get an e-mail in the next two days asking about your experience.  I hope that your e-visit has been valuable and will speed your recovery. Thank you for using e-visits.

## 2018-11-18 ENCOUNTER — Encounter: Payer: Self-pay | Admitting: Family Medicine

## 2018-11-18 ENCOUNTER — Ambulatory Visit: Payer: Managed Care, Other (non HMO) | Admitting: Family Medicine

## 2018-11-18 VITALS — BP 141/88 | HR 111 | Temp 97.7°F | Wt 228.4 lb

## 2018-11-18 DIAGNOSIS — J4541 Moderate persistent asthma with (acute) exacerbation: Secondary | ICD-10-CM

## 2018-11-18 MED ORDER — PREDNISONE 10 MG PO TABS: ORAL_TABLET | ORAL | 0 refills | Status: DC

## 2018-11-18 MED ORDER — ALBUTEROL SULFATE (2.5 MG/3ML) 0.083% IN NEBU: 2.5000 mg | INHALATION_SOLUTION | Freq: Once | RESPIRATORY_TRACT | Status: DC

## 2018-11-18 MED ORDER — IPRATROPIUM-ALBUTEROL 0.5-2.5 (3) MG/3ML IN SOLN
3.0000 mL | Freq: Once | RESPIRATORY_TRACT | Status: AC
  Administered 2018-11-18: 3 mL via RESPIRATORY_TRACT

## 2018-11-18 MED ORDER — BUDESONIDE-FORMOTEROL FUMARATE 160-4.5 MCG/ACT IN AERO: 2.0000 | INHALATION_SPRAY | Freq: Two times a day (BID) | RESPIRATORY_TRACT | 11 refills | Status: DC

## 2018-11-18 MED ORDER — PROMETHAZINE-DM 6.25-15 MG/5ML PO SYRP: 2.5000 mL | ORAL_SOLUTION | Freq: Four times a day (QID) | ORAL | 0 refills | Status: DC | PRN

## 2018-11-18 NOTE — Progress Notes (Signed)
BP (!) 141/88 (BP Location: Left Arm, Cuff Size: Normal)   Pulse (!) 111   Temp 97.7 F (36.5 C) (Oral)   Wt 228 lb 6.4 oz (103.6 kg)   SpO2 97%   BMI 35.77 kg/m    Subjective:    Patient ID: Ashley Kidd, female    DOB: [birth year], 38 y.o.   MRN: [id number]  HPI: Ashley Kidd is a 38 y.o. female  Chief Complaint  Patient presents with  . Asthma  Severeal weeks of sinus congestion, cough, wheezing spells, SOB. Just finished a course of augmentin with good relief of congestion and sinus sxs but continued asthma sxs. Currently just on albuterol prn and mucinex. Denies fevers, chills, body aches, CP. Several sick contacts.   Relevant past medical, surgical, family and social history reviewed and updated as indicated. Interim medical history since our last visit reviewed. Allergies and medications reviewed and updated.  Review of Systems  Per HPI unless specifically indicated above     Objective:    BP (!) 141/88 (BP Location: Left Arm, Cuff Size: Normal)   Pulse (!) 111   Temp 97.7 F (36.5 C) (Oral)   Wt 228 lb 6.4 oz (103.6 kg)   SpO2 97%   BMI 35.77 kg/m   Wt Readings from Last 3 Encounters:  11/18/18 228 lb 6.4 oz (103.6 kg)  08/08/18 224 lb 9.6 oz (101.9 kg)  07/30/18 225 lb (102.1 kg)    Physical Exam Vitals signs and nursing note reviewed.  Constitutional:      Appearance: Normal appearance.  HENT:     Head: Atraumatic.     Right Ear: Tympanic membrane and external ear normal.     Left Ear: Tympanic membrane and external ear normal.     Nose: Congestion present.     Mouth/Throat:     Mouth: Mucous membranes are moist.     Pharynx: Posterior oropharyngeal erythema present.  Eyes:     Extraocular Movements: Extraocular movements intact.     Conjunctiva/sclera: Conjunctivae normal.  Neck:     Musculoskeletal: Normal range of motion and neck supple.  Cardiovascular:     Rate and Rhythm: Normal rate and regular rhythm.     Heart sounds: Normal  heart sounds.  Pulmonary:     Effort: Pulmonary effort is normal.     Breath sounds: Wheezing present.  Musculoskeletal: Normal range of motion.  Skin:    General: Skin is warm and dry.  Neurological:     Mental Status: She is alert and oriented to person, place, and time.  Psychiatric:        Mood and Affect: Mood normal.        Thought Content: Thought content normal.     Results for orders placed or performed in visit on 07/15/18  Microscopic Examination  Result Value Ref Range   WBC, UA None seen 0 - 5 /hpf   RBC, UA None seen 0 - 2 /hpf   Epithelial Cells (non renal) 0-10 0 - 10 /hpf   Bacteria, UA None seen None seen/Few  CBC with Differential/Platelet  Result Value Ref Range   WBC 8.9 3.4 - 10.8 x10E3/uL   RBC 4.89 3.77 - 5.28 x10E6/uL   Hemoglobin 11.9 11.1 - 15.9 g/dL   Hematocrit 38.0 34.0 - 46.6 %   MCV 78 (L) 79 - 97 fL   MCH 24.3 (L) 26.6 - 33.0 pg   MCHC 31.3 (L) 31.5 - 35.7 g/dL   RDW  14.4 12.3 - 15.4 %   Platelets 557 (H) 150 - 450 x10E3/uL   Neutrophils 67 Not Estab. %   Lymphs 25 Not Estab. %   Monocytes 5 Not Estab. %   Eos 2 Not Estab. %   Basos 0 Not Estab. %   Neutrophils Absolute 6.0 1.4 - 7.0 x10E3/uL   Lymphocytes Absolute 2.2 0.7 - 3.1 x10E3/uL   Monocytes Absolute 0.4 0.1 - 0.9 x10E3/uL   EOS (ABSOLUTE) 0.2 0.0 - 0.4 x10E3/uL   Basophils Absolute 0.0 0.0 - 0.2 x10E3/uL   Immature Granulocytes 1 Not Estab. %   Immature Grans (Abs) 0.1 0.0 - 0.1 x10E3/uL  Comprehensive metabolic panel  Result Value Ref Range   Glucose 180 (H) 65 - 99 mg/dL   BUN 6 6 - 20 mg/dL   Creatinine, Ser 0.73 0.57 - 1.00 mg/dL   GFR calc non Af Amer 106 >59 mL/min/1.73   GFR calc Af Amer 122 >59 mL/min/1.73   BUN/Creatinine Ratio 8 (L) 9 - 23   Sodium 138 134 - 144 mmol/L   Potassium 4.6 3.5 - 5.2 mmol/L   Chloride 99 96 - 106 mmol/L   CO2 21 20 - 29 mmol/L   Calcium 9.3 8.7 - 10.2 mg/dL   Total Protein 6.6 6.0 - 8.5 g/dL   Albumin 3.9 3.5 - 5.5 g/dL    Globulin, Total 2.7 1.5 - 4.5 g/dL   Albumin/Globulin Ratio 1.4 1.2 - 2.2   Bilirubin Total 0.2 0.0 - 1.2 mg/dL   Alkaline Phosphatase 129 (H) 39 - 117 IU/L   AST 118 (H) 0 - 40 IU/L   ALT 48 (H) 0 - 32 IU/L  Lipid Panel w/o Chol/HDL Ratio  Result Value Ref Range   Cholesterol, Total 227 (H) 100 - 199 mg/dL   Triglycerides 269 (H) 0 - 149 mg/dL   HDL 38 (L) >39 mg/dL   VLDL Cholesterol Cal 54 (H) 5 - 40 mg/dL   LDL Calculated 135 (H) 0 - 99 mg/dL  TSH  Result Value Ref Range   TSH 2.410 0.450 - 4.500 uIU/mL  UA/M w/rflx Culture, Routine  Result Value Ref Range   Specific Gravity, UA <1.005 (L) 1.005 - 1.030   pH, UA 6.5 5.0 - 7.5   Color, UA Yellow Yellow   Appearance Ur Clear Clear   Leukocytes, UA Negative Negative   Protein, UA Negative Negative/Trace   Glucose, UA Negative Negative   Ketones, UA Negative Negative   RBC, UA 2+ (A) Negative   Bilirubin, UA Negative Negative   Urobilinogen, Ur 0.2 0.2 - 1.0 mg/dL   Nitrite, UA Negative Negative   Microscopic Examination See below:       Assessment & Plan:   Problem List Items Addressed This Visit      Respiratory   Asthma - Primary    With acute exacerbation from sinus infection. Good improvement with duoneb in clinic. Tx with prednisone, phenergan DM, mucinex, supportive care. Will start symbicort daily due to frequency of exacerbations. Instructions given.       Relevant Medications   predniSONE (DELTASONE) 10 MG tablet   budesonide-formoterol (SYMBICORT) 160-4.5 MCG/ACT inhaler   ipratropium-albuterol (DUONEB) 0.5-2.5 (3) MG/3ML nebulizer solution 3 mL (Completed)       Follow up plan: Return if symptoms worsen or fail to improve.

## 2018-11-19 NOTE — Assessment & Plan Note (Signed)
With acute exacerbation from sinus infection. Good improvement with duoneb in clinic. Tx with prednisone, phenergan DM, mucinex, supportive care. Will start symbicort daily due to frequency of exacerbations. Instructions given.

## 2018-11-24 ENCOUNTER — Encounter: Payer: Self-pay | Admitting: Family Medicine

## 2018-12-16 ENCOUNTER — Telehealth: Payer: Managed Care, Other (non HMO) | Admitting: Family

## 2018-12-16 DIAGNOSIS — J069 Acute upper respiratory infection, unspecified: Secondary | ICD-10-CM | POA: Diagnosis not present

## 2018-12-16 MED ORDER — FLUTICASONE PROPIONATE 50 MCG/ACT NA SUSP: NASAL | 12 refills | Status: DC

## 2018-12-16 MED ORDER — BENZONATATE 200 MG PO CAPS: 200.0000 mg | ORAL_CAPSULE | Freq: Three times a day (TID) | ORAL | 0 refills | Status: DC | PRN

## 2018-12-16 NOTE — Progress Notes (Signed)
Greater than 5 minutes, yet less than 10 minutes of time have been spent researching, coordinating, and implementing care for this patient today.  Thank you for the details you included in the comment boxes. Those details are very helpful in determining the best course of treatment for you and help us to provide the best care.  We are sorry you are not feeling well.  Here is how we plan to help!  Based on what you have shared with me, it looks like you may have a viral upper respiratory infection.  Upper respiratory infections are caused by a large number of viruses; however, rhinovirus is the most common cause.   Symptoms vary from person to person, with common symptoms including sore throat, cough, and fatigue or lack of energy.  A low-grade fever of up to 100.4 may present, but is often uncommon.  Symptoms vary however, and are closely related to a person's age or underlying illnesses.  The most common symptoms associated with an upper respiratory infection are nasal discharge or congestion, cough, sneezing, headache and pressure in the ears and face.  These symptoms usually persist for about 3 to 10 days, but can last up to 2 weeks.  It is important to know that upper respiratory infections do not cause serious illness or complications in most cases.    Upper respiratory infections can be transmitted from person to person, with the most common method of transmission being a person's hands.  The virus is able to live on the skin and can infect other persons for up to 2 hours after direct contact.  Also, these can be transmitted when someone coughs or sneezes; thus, it is important to cover the mouth to reduce this risk.  To keep the spread of the illness at bay, good hand hygiene is very important.  This is an infection that is most likely caused by a virus. There are no specific treatments other than to help you with the symptoms until the infection runs its course.  We are sorry you are not feeling  well.  Here is how we plan to help!   For nasal congestion, you may use an oral decongestants such as Mucinex D or if you have glaucoma or high blood pressure use plain Mucinex.  Saline nasal spray or nasal drops can help and can safely be used as often as needed for congestion.  For your congestion, I have prescribed Fluticasone nasal spray one spray in each nostril twice a day  If you do not have a history of heart disease, hypertension, diabetes or thyroid disease, prostate/bladder issues or glaucoma, you may also use Sudafed to treat nasal congestion.  It is highly recommended that you consult with a pharmacist or your primary care physician to ensure this medication is safe for you to take.     If you have a cough, you may use cough suppressants such as Delsym and Robitussin.  If you have glaucoma or high blood pressure, you can also use Coricidin HBP.   For cough I have prescribed for you A prescription cough medication called Tessalon Perles 100 mg. You may take 1-2 capsules every 8 hours as needed for cough  If you have a sore or scratchy throat, use a saltwater gargle-  to  teaspoon of salt dissolved in a 4-ounce to 8-ounce glass of warm water.  Gargle the solution for approximately 15-30 seconds and then spit.  It is important not to swallow the solution.  You can also   use throat lozenges/cough drops and Chloraseptic spray to help with throat pain or discomfort.  Warm or cold liquids can also be helpful in relieving throat pain.  For headache, pain or general discomfort, you can use Ibuprofen or Tylenol as directed.   Some authorities believe that zinc sprays or the use of Echinacea may shorten the course of your symptoms.   HOME CARE . Only take medications as instructed by your medical team. . Be sure to drink plenty of fluids. Water is fine as well as fruit juices, sodas and electrolyte beverages. You may want to stay away from caffeine or alcohol. If you are nauseated, try taking  small sips of liquids. How do you know if you are getting enough fluid? Your urine should be a pale yellow or almost colorless. . Get rest. . Taking a steamy shower or using a humidifier may help nasal congestion and ease sore throat pain. You can place a towel over your head and breathe in the steam from hot water coming from a faucet. . Using a saline nasal spray works much the same way. . Cough drops, hard candies and sore throat lozenges may ease your cough. . Avoid close contacts especially the very young and the elderly . Cover your mouth if you cough or sneeze . Always remember to wash your hands.   GET HELP RIGHT AWAY IF: . You develop worsening fever. . If your symptoms do not improve within 10 days . You develop yellow or green discharge from your nose over 3 days. . You have coughing fits . You develop a severe head ache or visual changes. . You develop shortness of breath, difficulty breathing or start having chest pain . Your symptoms persist after you have completed your treatment plan  MAKE SURE YOU   Understand these instructions.  Will watch your condition.  Will get help right away if you are not doing well or get worse.  Your e-visit answers were reviewed by a board certified advanced clinical practitioner to complete your personal care plan. Depending upon the condition, your plan could have included both over the counter or prescription medications. Please review your pharmacy choice. If there is a problem, you may call our nursing hot line at and have the prescription routed to another pharmacy. Your safety is important to us. If you have drug allergies check your prescription carefully.   You can use MyChart to ask questions about today's visit, request a non-urgent call back, or ask for a work or school excuse for 24 hours related to this e-Visit. If it has been greater than 24 hours you will need to follow up with your provider, or enter a new e-Visit to address  those concerns. You will get an e-mail in the next two days asking about your experience.  I hope that your e-visit has been valuable and will speed your recovery. Thank you for using e-visits.      

## 2019-01-12 ENCOUNTER — Other Ambulatory Visit: Payer: Self-pay | Admitting: Family Medicine

## 2019-01-16 ENCOUNTER — Encounter: Payer: Self-pay | Admitting: Family Medicine

## 2019-01-16 ENCOUNTER — Ambulatory Visit (INDEPENDENT_AMBULATORY_CARE_PROVIDER_SITE_OTHER): Payer: Managed Care, Other (non HMO) | Admitting: Family Medicine

## 2019-01-16 ENCOUNTER — Other Ambulatory Visit: Payer: Self-pay

## 2019-01-16 DIAGNOSIS — G43709 Chronic migraine without aura, not intractable, without status migrainosus: Secondary | ICD-10-CM | POA: Diagnosis not present

## 2019-01-16 DIAGNOSIS — J4540 Moderate persistent asthma, uncomplicated: Secondary | ICD-10-CM | POA: Diagnosis not present

## 2019-01-16 DIAGNOSIS — R5382 Chronic fatigue, unspecified: Secondary | ICD-10-CM

## 2019-01-16 DIAGNOSIS — Z13220 Encounter for screening for lipoid disorders: Secondary | ICD-10-CM

## 2019-01-16 DIAGNOSIS — T7840XD Allergy, unspecified, subsequent encounter: Secondary | ICD-10-CM

## 2019-01-16 MED ORDER — SUMATRIPTAN SUCCINATE 100 MG PO TABS: 100.0000 mg | ORAL_TABLET | ORAL | 12 refills | Status: DC | PRN

## 2019-01-16 MED ORDER — MONTELUKAST SODIUM 10 MG PO TABS: ORAL_TABLET | ORAL | 1 refills | Status: DC

## 2019-01-16 MED ORDER — FLUTICASONE PROPIONATE 50 MCG/ACT NA SUSP: NASAL | 12 refills | Status: DC

## 2019-01-16 MED ORDER — ALBUTEROL SULFATE HFA 108 (90 BASE) MCG/ACT IN AERS: INHALATION_SPRAY | RESPIRATORY_TRACT | 6 refills | Status: DC

## 2019-01-16 MED ORDER — BUDESONIDE-FORMOTEROL FUMARATE 160-4.5 MCG/ACT IN AERO: 2.0000 | INHALATION_SPRAY | Freq: Two times a day (BID) | RESPIRATORY_TRACT | 11 refills | Status: DC

## 2019-01-16 NOTE — Assessment & Plan Note (Signed)
Under good control on current regimen. Continue current regimen. Continue to monitor. Call with any concerns. Refills given.   

## 2019-01-16 NOTE — Progress Notes (Signed)
There were no vitals taken for this visit.   Subjective:    Patient ID: Ashley Kidd, female    DOB: 1980/12/21, 38 y.o.   MRN: [id number]  HPI: Ashley Kidd is a 38 y.o. female  Chief Complaint  Patient presents with  . Migraine  . Asthma   Breathing is doing well. Medicine is working well. She had a URI about a month ago, but has been doing well since. Inhalers have not been giving her any side effects.   Migraines are doing great. Having headaches very occasionally. Medicine works well with it. No other concerns. With that. Only concern today is that she has been a bit fatigued.   FATIGUE Duration:  weeks Severity: moderate  Onset: gradual Context when symptoms started:  unknown Symptoms improve with rest: no  Depressive symptoms: no Stress/anxiety: no Insomnia: no  Snoring: no Observed apnea by bed partner: no Daytime hypersomnolence:yes Wakes feeling refreshed: no History of sleep study: no Dysnea on exertion:  no Orthopnea/PND: no Chest pain: no Chronic cough: no Lower extremity edema: no Arthralgias:no Myalgias: no Weakness: no Rash: no   Relevant past medical, surgical, family and social history reviewed and updated as indicated. Interim medical history since our last visit reviewed. Allergies and medications reviewed and updated.  Review of Systems  Constitutional: Positive for fatigue. Negative for activity change, appetite change, chills, diaphoresis, fever and unexpected weight change.  HENT: Negative.   Respiratory: Negative.   Cardiovascular: Negative.   Gastrointestinal: Negative.   Musculoskeletal: Negative.   Neurological: Negative.   Psychiatric/Behavioral: Negative.     Per HPI unless specifically indicated above     Objective:    There were no vitals taken for this visit.  Wt Readings from Last 3 Encounters:  11/18/18 228 lb 6.4 oz (103.6 kg)  08/08/18 224 lb 9.6 oz (101.9 kg)  07/30/18 225 lb (102.1 kg)    Physical Exam  Vitals signs and nursing note reviewed.  Constitutional:      General: She is not in acute distress.    Appearance: Normal appearance. She is not ill-appearing, toxic-appearing or diaphoretic.  HENT:     Head: Normocephalic and atraumatic.     Right Ear: External ear normal.     Left Ear: External ear normal.     Nose: Nose normal.     Mouth/Throat:     Mouth: Mucous membranes are moist.     Pharynx: Oropharynx is clear.  Eyes:     General: No scleral icterus.       Right eye: No discharge.        Left eye: No discharge.     Conjunctiva/sclera: Conjunctivae normal.     Pupils: Pupils are equal, round, and reactive to light.  Neck:     Musculoskeletal: Normal range of motion.  Pulmonary:     Effort: Pulmonary effort is normal. No respiratory distress.     Comments: Speaking in full sentences Musculoskeletal: Normal range of motion.  Skin:    Coloration: Skin is not jaundiced or pale.     Findings: No bruising, erythema, lesion or rash.  Neurological:     Mental Status: She is alert and oriented to person, place, and time. Mental status is at baseline.  Psychiatric:        Mood and Affect: Mood normal.        Behavior: Behavior normal.        Thought Content: Thought content normal.  Judgment: Judgment normal.     Results for orders placed or performed in visit on 07/15/18  Microscopic Examination  Result Value Ref Range   WBC, UA None seen 0 - 5 /hpf   RBC, UA None seen 0 - 2 /hpf   Epithelial Cells (non renal) 0-10 0 - 10 /hpf   Bacteria, UA None seen None seen/Few  CBC with Differential/Platelet  Result Value Ref Range   WBC 8.9 3.4 - 10.8 x10E3/uL   RBC 4.89 3.77 - 5.28 x10E6/uL   Hemoglobin 11.9 11.1 - 15.9 g/dL   Hematocrit 38.0 34.0 - 46.6 %   MCV 78 (L) 79 - 97 fL   MCH 24.3 (L) 26.6 - 33.0 pg   MCHC 31.3 (L) 31.5 - 35.7 g/dL   RDW 14.4 12.3 - 15.4 %   Platelets 557 (H) 150 - 450 x10E3/uL   Neutrophils 67 Not Estab. %   Lymphs 25 Not Estab. %    Monocytes 5 Not Estab. %   Eos 2 Not Estab. %   Basos 0 Not Estab. %   Neutrophils Absolute 6.0 1.4 - 7.0 x10E3/uL   Lymphocytes Absolute 2.2 0.7 - 3.1 x10E3/uL   Monocytes Absolute 0.4 0.1 - 0.9 x10E3/uL   EOS (ABSOLUTE) 0.2 0.0 - 0.4 x10E3/uL   Basophils Absolute 0.0 0.0 - 0.2 x10E3/uL   Immature Granulocytes 1 Not Estab. %   Immature Grans (Abs) 0.1 0.0 - 0.1 x10E3/uL  Comprehensive metabolic panel  Result Value Ref Range   Glucose 180 (H) 65 - 99 mg/dL   BUN 6 6 - 20 mg/dL   Creatinine, Ser 0.73 0.57 - 1.00 mg/dL   GFR calc non Af Amer 106 >59 mL/min/1.73   GFR calc Af Amer 122 >59 mL/min/1.73   BUN/Creatinine Ratio 8 (L) 9 - 23   Sodium 138 134 - 144 mmol/L   Potassium 4.6 3.5 - 5.2 mmol/L   Chloride 99 96 - 106 mmol/L   CO2 21 20 - 29 mmol/L   Calcium 9.3 8.7 - 10.2 mg/dL   Total Protein 6.6 6.0 - 8.5 g/dL   Albumin 3.9 3.5 - 5.5 g/dL   Globulin, Total 2.7 1.5 - 4.5 g/dL   Albumin/Globulin Ratio 1.4 1.2 - 2.2   Bilirubin Total 0.2 0.0 - 1.2 mg/dL   Alkaline Phosphatase 129 (H) 39 - 117 IU/L   AST 118 (H) 0 - 40 IU/L   ALT 48 (H) 0 - 32 IU/L  Lipid Panel w/o Chol/HDL Ratio  Result Value Ref Range   Cholesterol, Total 227 (H) 100 - 199 mg/dL   Triglycerides 269 (H) 0 - 149 mg/dL   HDL 38 (L) >39 mg/dL   VLDL Cholesterol Cal 54 (H) 5 - 40 mg/dL   LDL Calculated 135 (H) 0 - 99 mg/dL  TSH  Result Value Ref Range   TSH 2.410 0.450 - 4.500 uIU/mL  UA/M w/rflx Culture, Routine  Result Value Ref Range   Specific Gravity, UA <1.005 (L) 1.005 - 1.030   pH, UA 6.5 5.0 - 7.5   Color, UA Yellow Yellow   Appearance Ur Clear Clear   Leukocytes, UA Negative Negative   Protein, UA Negative Negative/Trace   Glucose, UA Negative Negative   Ketones, UA Negative Negative   RBC, UA 2+ (A) Negative   Bilirubin, UA Negative Negative   Urobilinogen, Ur 0.2 0.2 - 1.0 mg/dL   Nitrite, UA Negative Negative   Microscopic Examination See below:       Assessment &  Plan:   Problem  List Items Addressed This Visit      Cardiovascular and Mediastinum   Migraine    Under good control on current regimen. Continue current regimen. Continue to monitor. Call with any concerns. Refills given.        Relevant Medications   SUMAtriptan (IMITREX) 100 MG tablet     Respiratory   Asthma    Under good control on current regimen. Continue current regimen. Continue to monitor. Call with any concerns. Refills given.        Relevant Medications   albuterol (VENTOLIN HFA) 108 (90 Base) MCG/ACT inhaler   budesonide-formoterol (SYMBICORT) 160-4.5 MCG/ACT inhaler   montelukast (SINGULAIR) 10 MG tablet     Other   Allergy    Under good control on current regimen. Continue current regimen. Continue to monitor. Call with any concerns. Refills given.        Relevant Medications   fluticasone (FLONASE) 50 MCG/ACT nasal spray    Other Visit Diagnoses    Chronic fatigue    -  Primary   Will check labs to look for cause. If nothing showing up, consider sleep study. Call with any concerns.    Relevant Orders   CBC with Differential/Platelet   Comprehensive metabolic panel   Lipid Panel w/o Chol/HDL Ratio   TSH   VITAMIN D 25 Hydroxy (Vit-D Deficiency, Fractures)   Screening for cholesterol level       Labs to be drawn, await results.    Relevant Orders   Lipid Panel w/o Chol/HDL Ratio       Follow up plan: Return in about 6 months (around 07/18/2019) for Physical.    . This visit was completed via FaceTime due to the restrictions of the COVID-19 pandemic. All issues as above were discussed and addressed. Physical exam was done as above through visual confirmation on FaceTime. If it was felt that the patient should be evaluated in the office, they were directed there. The patient verbally consented to this visit. . Location of the patient: home . Location of the provider: home . Those involved with this call:  . Provider: Park Liter, DO . CMA: Tiffany Reel, CMA .  Front Desk/Registration: Don Perking  . Time spent on call: 25 minutes on the phone discussing health concerns. 40 minutes total spent in review of patient's record and preparation of their chart.

## 2019-01-23 ENCOUNTER — Encounter: Payer: Self-pay | Admitting: Family Medicine

## 2019-02-08 ENCOUNTER — Telehealth: Payer: Managed Care, Other (non HMO) | Admitting: Nurse Practitioner

## 2019-02-08 DIAGNOSIS — J0100 Acute maxillary sinusitis, unspecified: Secondary | ICD-10-CM | POA: Diagnosis not present

## 2019-02-08 MED ORDER — AMOXICILLIN-POT CLAVULANATE 875-125 MG PO TABS: 1.0000 | ORAL_TABLET | Freq: Two times a day (BID) | ORAL | 0 refills | Status: DC

## 2019-02-08 NOTE — Progress Notes (Signed)

## 2019-02-21 ENCOUNTER — Other Ambulatory Visit: Payer: Managed Care, Other (non HMO)

## 2019-02-24 ENCOUNTER — Other Ambulatory Visit: Payer: Managed Care, Other (non HMO)

## 2019-02-24 ENCOUNTER — Other Ambulatory Visit: Payer: Self-pay

## 2019-02-24 DIAGNOSIS — Z13220 Encounter for screening for lipoid disorders: Secondary | ICD-10-CM

## 2019-02-24 DIAGNOSIS — R5382 Chronic fatigue, unspecified: Secondary | ICD-10-CM

## 2019-02-25 LAB — COMPREHENSIVE METABOLIC PANEL
ALT: 53 IU/L — ABNORMAL HIGH (ref 0–32)
AST: 160 IU/L — ABNORMAL HIGH (ref 0–40)
Albumin/Globulin Ratio: 1.4 (ref 1.2–2.2)
Albumin: 4 g/dL (ref 3.8–4.8)
Alkaline Phosphatase: 152 IU/L — ABNORMAL HIGH (ref 39–117)
BUN/Creatinine Ratio: 9 (ref 9–23)
BUN: 6 mg/dL (ref 6–20)
Bilirubin Total: 0.3 mg/dL (ref 0.0–1.2)
CO2: 21 mmol/L (ref 20–29)
Calcium: 9.2 mg/dL (ref 8.7–10.2)
Chloride: 98 mmol/L (ref 96–106)
Creatinine, Ser: 0.65 mg/dL (ref 0.57–1.00)
GFR calc Af Amer: 131 mL/min/{1.73_m2} (ref >59)
GFR calc non Af Amer: 114 mL/min/{1.73_m2} (ref >59)
Globulin, Total: 2.9 g/dL (ref 1.5–4.5)
Glucose: 163 mg/dL — ABNORMAL HIGH (ref 65–99)
Potassium: 4.6 mmol/L (ref 3.5–5.2)
Sodium: 138 mmol/L (ref 134–144)
Total Protein: 6.9 g/dL (ref 6.0–8.5)

## 2019-02-25 LAB — CBC WITH DIFFERENTIAL/PLATELET
Basophils Absolute: 0.1 10*3/uL (ref 0.0–0.2)
Basos: 1 %
EOS (ABSOLUTE): 0.2 10*3/uL (ref 0.0–0.4)
Eos: 1 %
Hematocrit: 38.8 % (ref 34.0–46.6)
Hemoglobin: 12.6 g/dL (ref 11.1–15.9)
Immature Grans (Abs): 0.1 10*3/uL (ref 0.0–0.1)
Immature Granulocytes: 1 %
Lymphocytes Absolute: 2.2 10*3/uL (ref 0.7–3.1)
Lymphs: 22 %
MCH: 25.2 pg — ABNORMAL LOW (ref 26.6–33.0)
MCHC: 32.5 g/dL (ref 31.5–35.7)
MCV: 78 fL — ABNORMAL LOW (ref 79–97)
Monocytes Absolute: 0.5 10*3/uL (ref 0.1–0.9)
Monocytes: 5 %
Neutrophils Absolute: 7.3 10*3/uL — ABNORMAL HIGH (ref 1.4–7.0)
Neutrophils: 70 %
Platelets: 532 10*3/uL — ABNORMAL HIGH (ref 150–450)
RBC: 5 x10E6/uL (ref 3.77–5.28)
RDW: 15.4 % (ref 11.7–15.4)
WBC: 10.4 10*3/uL (ref 3.4–10.8)

## 2019-02-25 LAB — VITAMIN D 25 HYDROXY (VIT D DEFICIENCY, FRACTURES): Vit D, 25-Hydroxy: 23.2 ng/mL — ABNORMAL LOW (ref 30.0–100.0)

## 2019-02-25 LAB — LIPID PANEL W/O CHOL/HDL RATIO
Cholesterol, Total: 230 mg/dL — ABNORMAL HIGH (ref 100–199)
HDL: 31 mg/dL — ABNORMAL LOW (ref >39)
LDL Calculated: 134 mg/dL — ABNORMAL HIGH (ref 0–99)
Triglycerides: 325 mg/dL — ABNORMAL HIGH (ref 0–149)
VLDL Cholesterol Cal: 65 mg/dL — ABNORMAL HIGH (ref 5–40)

## 2019-02-25 LAB — TSH: TSH: 2.96 u[IU]/mL (ref 0.450–4.500)

## 2019-04-11 ENCOUNTER — Telehealth: Payer: Managed Care, Other (non HMO) | Admitting: Family

## 2019-04-11 DIAGNOSIS — J329 Chronic sinusitis, unspecified: Secondary | ICD-10-CM

## 2019-04-11 NOTE — Progress Notes (Signed)
Based on what you shared with me, I feel your condition warrants further evaluation and I recommend that you be seen for a face to face office visit.  Because of the reoccurrence of sinusitis we need to have you see your primary care provider.    NOTE: If you entered your credit card information for this eVisit, you will not be charged. You may see a "hold" on your card for the $35 but that hold will drop off and you will not have a charge processed.  If you are having a true medical emergency please call 911.     For an urgent face to face visit, [facility] has five urgent care centers for your convenience:    [email or web address] to reserve your spot online an avoid wait times  [facility] [address], [address] [place], [place] [address] Modified hours of operation: Monday-Friday, 12 PM to 6 PM  Closed Saturday & Sunday  *Across the street from [place] (New Address!) [address] [place], [place] [address] *Just off [address], across the road from [place] hours of operation: Monday-Friday, 12 PM to 6 PM  Closed Saturday & Sunday   The following sites will take your insurance:  . [facility]    [phone]                  Get Driving Directions  [address], [place] [address] . 10 am to 8 pm Monday-Friday . 12 pm to 8 pm Saturday-Sunday   . [facility] [place]                  Get Driving Directions  [address], [place], [place] [address] . 8 am to 8 pm Monday-Friday . 9 am to 6 pm Saturday . 11 am to 6 pm Sunday   . [facility] at [place]                  Get Driving Directions   [address] [place], [place] [address] . 8 am to 8 pm Monday-Friday . 8 am to 4 pm Saturday-Sunday    . [facility] [place]                     Get Driving Directions  [phone]  [address], [place], [place] [address]  . Monday-Friday, 12 PM to 6 PM    Your e-visit answers were reviewed by a board certified advanced clinical practitioner to complete your personal care plan.  Thank you for using e-Visits.

## 2019-05-09 LAB — HM PAP SMEAR: HM Pap smear: NEGATIVE

## 2019-06-16 ENCOUNTER — Telehealth: Payer: Managed Care, Other (non HMO) | Admitting: Physician Assistant

## 2019-06-16 DIAGNOSIS — R112 Nausea with vomiting, unspecified: Secondary | ICD-10-CM | POA: Diagnosis not present

## 2019-06-16 MED ORDER — ONDANSETRON HCL 4 MG PO TABS: 4.0000 mg | ORAL_TABLET | Freq: Four times a day (QID) | ORAL | 0 refills | Status: DC

## 2019-06-16 NOTE — Progress Notes (Signed)
We are sorry that you are not feeling well. Here is how we plan to help!  Based on what you have shared with me it looks like you have a Virus that is irritating your GI tract.  Vomiting is the forceful emptying of a portion of the stomach's content through the mouth.  Although nausea and vomiting can make you feel miserable, it's important to remember that these are not diseases, but rather symptoms of an underlying illness.  When we treat short term symptoms, we always caution that any symptoms that persist should be fully evaluated in a medical office.  I have prescribed a medication that will help alleviate your symptoms and allow you to stay hydrated:  Zofran 4 mg 1 tablet every 8 hours as needed for nausea and vomiting  HOME CARE: Drink clear liquids.  This is very important! Dehydration (the lack of fluid) can lead to a serious complication.  Start off with 1 tablespoon every 5 minutes for 8 hours. You may begin eating bland foods after 8 hours without vomiting.  Start with saltine crackers, white bread, rice, mashed potatoes, applesauce. After 48 hours on a bland diet, you may resume a normal diet. Try to go to sleep.  Sleep often empties the stomach and relieves the need to vomit.  GET HELP RIGHT AWAY IF:  Your symptoms do not improve or worsen within 2 days after treatment. You have a fever for over 3 days. You cannot keep down fluids after trying the medication.  MAKE SURE YOU:  Understand these instructions. Will watch your condition. Will get help right away if you are not doing well or get worse.   Thank you for choosing an e-visit. Your e-visit answers were reviewed by a board certified advanced clinical practitioner to complete your personal care plan. Depending upon the condition, your plan could have included both over the counter or prescription medications. Please review your pharmacy choice. Be sure that the pharmacy you have chosen is open so that you can pick up  your prescription now.  If there is a problem you may message your provider in MyChart to have the prescription routed to another pharmacy. Your safety is important to us. If you have drug allergies check your prescription carefully.  For the next 24 hours, you can use MyChart to ask questions about today's visit, request a non-urgent call back, or ask for a work or school excuse from your e-visit provider. You will get an e-mail in the next two days asking about your experience. I hope that your e-visit has been valuable and will speed your recovery.  Greater than 5 minutes, yet less than 10 minutes of time have been spent researching, coordinating, and implementing care for this patient today  

## 2019-07-18 ENCOUNTER — Encounter: Payer: Self-pay | Admitting: Family Medicine

## 2019-07-18 ENCOUNTER — Other Ambulatory Visit: Payer: Self-pay

## 2019-07-18 ENCOUNTER — Ambulatory Visit (INDEPENDENT_AMBULATORY_CARE_PROVIDER_SITE_OTHER): Payer: Managed Care, Other (non HMO) | Admitting: Family Medicine

## 2019-07-18 VITALS — BP 137/91 | HR 84 | Temp 98.3°F | Ht 66.3 in | Wt 218.0 lb

## 2019-07-18 DIAGNOSIS — Z23 Encounter for immunization: Secondary | ICD-10-CM | POA: Diagnosis not present

## 2019-07-18 DIAGNOSIS — Z0000 Encounter for general adult medical examination without abnormal findings: Secondary | ICD-10-CM | POA: Diagnosis not present

## 2019-07-18 DIAGNOSIS — J4540 Moderate persistent asthma, uncomplicated: Secondary | ICD-10-CM

## 2019-07-18 LAB — UA/M W/RFLX CULTURE, ROUTINE
Bilirubin, UA: NEGATIVE
Glucose, UA: NEGATIVE
Ketones, UA: NEGATIVE
Leukocytes,UA: NEGATIVE
Nitrite, UA: NEGATIVE
Protein,UA: NEGATIVE
RBC, UA: NEGATIVE
Specific Gravity, UA: 1.015 (ref 1.005–1.030)
Urobilinogen, Ur: 0.2 mg/dL (ref 0.2–1.0)
pH, UA: 7 (ref 5.0–7.5)

## 2019-07-18 LAB — BAYER DCA HB A1C WAIVED: HB A1C (BAYER DCA - WAIVED): 8.2 % — ABNORMAL HIGH (ref <7.0)

## 2019-07-18 MED ORDER — ALBUTEROL SULFATE HFA 108 (90 BASE) MCG/ACT IN AERS: INHALATION_SPRAY | RESPIRATORY_TRACT | 6 refills | Status: DC

## 2019-07-18 MED ORDER — SPIRIVA HANDIHALER 18 MCG IN CAPS: 18.0000 ug | ORAL_CAPSULE | Freq: Every day | RESPIRATORY_TRACT | 12 refills | Status: DC

## 2019-07-18 MED ORDER — OMEPRAZOLE 40 MG PO CPDR: 40.0000 mg | DELAYED_RELEASE_CAPSULE | Freq: Every day | ORAL | 1 refills | Status: DC

## 2019-07-18 MED ORDER — UMECLIDINIUM BROMIDE 62.5 MCG/INH IN AEPB: 1.0000 | INHALATION_SPRAY | Freq: Every day | RESPIRATORY_TRACT | 6 refills | Status: DC

## 2019-07-18 MED ORDER — MONTELUKAST SODIUM 10 MG PO TABS: ORAL_TABLET | ORAL | 1 refills | Status: DC

## 2019-07-18 NOTE — Progress Notes (Signed)
BP (!) 137/91   Pulse 84   Temp 98.3 F (36.8 C) (Oral)   Ht 5' 6.3" (1.684 m)   Wt 218 lb (98.9 kg)   SpO2 95%   BMI 34.87 kg/m    Subjective:    Patient ID: Ashley Kidd, female    DOB: 26-Apr-1981, 38 y.o.   MRN: [id number]  HPI: Ashley Kidd is a 38 y.o. female presenting on 07/18/2019 for comprehensive medical examination. Current medical complaints include:  ASTHMA Asthma status: exacerbated Satisfied with current treatment?: no Albuterol/rescue inhaler frequency: occasionally several times a day Dyspnea frequency: occasionally several times a day Wheezing frequency: occasionally several times a day Cough frequency: daily Nocturnal symptom frequency: never Limitation of activity: yes Current upper respiratory symptoms: no Aerochamber/spacer use: no Visits to ER or Urgent Care in past year: no Pneumovax: Up to Date Influenza: Up to Date  She currently lives with: husband and kids Menopausal Symptoms: no  Depression Screen done today and results listed below:  Depression screen [facility] 2/9 07/18/2019 07/15/2018 01/06/2018  Decreased Interest 0 1 1  Down, Depressed, Hopeless 1 0 1  PHQ - 2 Score 1 1 2   Altered sleeping 2 3 2   Tired, decreased energy 2 3 2   Change in appetite 0 1 2  Feeling bad or failure about yourself  0 0 0  Trouble concentrating 0 3 3  Moving slowly or fidgety/restless 0 1 0  Suicidal thoughts 0 0 0  PHQ-9 Score 5 12 11   Difficult doing work/chores Somewhat difficult Somewhat difficult -    Past Medical History:  Past Medical History:  Diagnosis Date  . Allergy   . Asthma   . GERD (gastroesophageal reflux disease)   . Migraine   . PCOS (polycystic ovarian syndrome)   . Sinus complaint     Surgical History:  Past Surgical History:  Procedure Laterality Date  . CHOLECYSTECTOMY  06-09-13  . OVARIAN CYST REMOVAL Bilateral 2002  . UPPER GI ENDOSCOPY  11-29-12   Dr Candace Cruise    Medications:  Current Outpatient Medications on File Prior  to Visit  Medication Sig  . aspirin 81 MG tablet Take 81 mg by mouth daily.  . budesonide-formoterol (SYMBICORT) 160-4.5 MCG/ACT inhaler Inhale 2 puffs into the lungs 2 (two) times daily.  . cetirizine (ZYRTEC) 10 MG tablet Take 10 mg by mouth daily.  . citalopram (CELEXA) 20 MG tablet Take 20 mg by mouth daily.  Marland Kitchen EPINEPHrine 0.3 mg/0.3 mL IJ SOAJ injection INJ 0.3 ML IM ONCE FOR 1 DOSE  . fluticasone (FLONASE) 50 MCG/ACT nasal spray INSTILL 2 SPRAYS IN EACH NOSTRIL EVERY DAY  . ondansetron (ZOFRAN ODT) 4 MG disintegrating tablet Take 1 tablet (4 mg total) by mouth every 8 (eight) hours as needed.  . ondansetron (ZOFRAN) 4 MG tablet Take 1 tablet (4 mg total) by mouth every 6 (six) hours.  . SUMAtriptan (IMITREX) 100 MG tablet Take 1 tablet (100 mg total) by mouth every 2 (two) hours as needed for migraine. One tab at onset and may repeat x1 in 1 hour. Max 200 mg/24 hours (Patient taking differently: Take 100 mg by mouth every 2 (two) hours as needed for migraine. One tab at onset and may repeat x1 in 1 hour. Max 200 mg/24 hours)  . ZOVIA 1/35E, 28, 1-35 MG-MCG tablet Take by mouth daily.   No current facility-administered medications on file prior to visit.     Allergies:  Allergies  Allergen Reactions  . Hydrocodone  Hives and Itching  . Tussionex Pennkinetic Er [Hydrocod Polst-Cpm Polst Er] Hives and Itching  . Bee Venom Hives    Social History:  Social History   Socioeconomic History  . Marital status: Married    Spouse name: Not on file  . Number of children: Not on file  . Years of education: Not on file  . Highest education level: Not on file  Occupational History  . Not on file  Social Needs  . Financial resource strain: Not on file  . Food insecurity    Worry: Not on file    Inability: Not on file  . Transportation needs    Medical: Not on file    Non-medical: Not on file  Tobacco Use  . Smoking status: Former Smoker    Years: 12.00    Quit date: 08/24/2012     Years since quitting: 6.9  . Smokeless tobacco: Never Used  Substance and Sexual Activity  . Alcohol use: No    Alcohol/week: 0.0 standard drinks  . Drug use: No  . Sexual activity: Yes  Lifestyle  . Physical activity    Days per week: Not on file    Minutes per session: Not on file  . Stress: Not on file  Relationships  . Social Herbalist on phone: Not on file    Gets together: Not on file    Attends religious service: Not on file    Active member of club or organization: Not on file    Attends meetings of clubs or organizations: Not on file    Relationship status: Not on file  . Intimate partner violence    Fear of current or ex partner: Not on file    Emotionally abused: Not on file    Physically abused: Not on file    Forced sexual activity: Not on file  Other Topics Concern  . Not on file  Social History Narrative  . Not on file   Social History   Tobacco Use  Smoking Status Former Smoker  . Years: 12.00  . Quit date: 08/24/2012  . Years since quitting: 6.9  Smokeless Tobacco Never Used   Social History   Substance and Sexual Activity  Alcohol Use No  . Alcohol/week: 0.0 standard drinks    Family History:  Family History  Problem Relation Age of Onset  . Diabetes Mother   . Asthma Mother   . Diabetes Father   . Heart attack Father   . Heart disease Father   . Asthma Sister   . Bipolar disorder Sister   . Asthma Brother   . Bipolar disorder Brother   . Epilepsy Brother     Past medical history, surgical history, medications, allergies, family history and social history reviewed with patient today and changes made to appropriate areas of the chart.   Review of Systems  Constitutional: Positive for diaphoresis. Negative for chills, fever, malaise/fatigue and weight loss.  HENT: Negative.   Eyes: Negative.   Respiratory: Positive for cough, shortness of breath and wheezing. Negative for hemoptysis and sputum production.   Cardiovascular:  Negative.   Gastrointestinal: Positive for heartburn. Negative for abdominal pain, blood in stool, constipation, diarrhea, melena, nausea and vomiting.  Genitourinary: Negative.   Musculoskeletal: Negative.   Skin: Negative.   Neurological: Negative.   Endo/Heme/Allergies: Positive for environmental allergies. Negative for polydipsia. Does not bruise/bleed easily.  Psychiatric/Behavioral: Negative for depression, hallucinations, memory loss, substance abuse and suicidal ideas. The patient is nervous/anxious.  The patient does not have insomnia.     All other ROS negative except what is listed above and in the HPI.      Objective:    BP (!) 137/91   Pulse 84   Temp 98.3 F (36.8 C) (Oral)   Ht 5' 6.3" (1.684 m)   Wt 218 lb (98.9 kg)   SpO2 95%   BMI 34.87 kg/m   Wt Readings from Last 3 Encounters:  07/18/19 218 lb (98.9 kg)  11/18/18 228 lb 6.4 oz (103.6 kg)  08/08/18 224 lb 9.6 oz (101.9 kg)    Physical Exam Vitals signs and nursing note reviewed.  Constitutional:      General: She is not in acute distress.    Appearance: Normal appearance. She is not ill-appearing, toxic-appearing or diaphoretic.  HENT:     Head: Normocephalic and atraumatic.     Right Ear: Tympanic membrane, ear canal and external ear normal. There is no impacted cerumen.     Left Ear: Tympanic membrane, ear canal and external ear normal. There is no impacted cerumen.     Nose: Nose normal. No congestion or rhinorrhea.     Mouth/Throat:     Mouth: Mucous membranes are moist.     Pharynx: Oropharynx is clear. No oropharyngeal exudate or posterior oropharyngeal erythema.  Eyes:     General: No scleral icterus.       Right eye: No discharge.        Left eye: No discharge.     Extraocular Movements: Extraocular movements intact.     Conjunctiva/sclera: Conjunctivae normal.     Pupils: Pupils are equal, round, and reactive to light.  Neck:     Musculoskeletal: Normal range of motion and neck supple. No  neck rigidity or muscular tenderness.     Vascular: No carotid bruit.  Cardiovascular:     Rate and Rhythm: Normal rate and regular rhythm.     Pulses: Normal pulses.     Heart sounds: No murmur. No friction rub. No gallop.   Pulmonary:     Effort: Pulmonary effort is normal. No respiratory distress.     Breath sounds: Normal breath sounds. No stridor. No wheezing, rhonchi or rales.  Chest:     Chest wall: No tenderness.  Abdominal:     General: Abdomen is flat. Bowel sounds are normal. There is no distension.     Palpations: Abdomen is soft. There is no mass.     Tenderness: There is no abdominal tenderness. There is no right CVA tenderness, left CVA tenderness, guarding or rebound.     Hernia: No hernia is present.  Genitourinary:    Comments: Breast and pelvic exams deferred with shared decision making Musculoskeletal:        General: No swelling, tenderness, deformity or signs of injury.     Right lower leg: No edema.     Left lower leg: No edema.  Lymphadenopathy:     Cervical: No cervical adenopathy.  Skin:    General: Skin is warm and dry.     Capillary Refill: Capillary refill takes less than 2 seconds.     Coloration: Skin is not jaundiced or pale.     Findings: No bruising, erythema, lesion or rash.  Neurological:     General: No focal deficit present.     Mental Status: She is alert and oriented to person, place, and time. Mental status is at baseline.     Cranial Nerves: No cranial nerve deficit.  Sensory: No sensory deficit.     Motor: No weakness.     Coordination: Coordination normal.     Gait: Gait normal.     Deep Tendon Reflexes: Reflexes normal.  Psychiatric:        Mood and Affect: Mood normal.        Behavior: Behavior normal.        Thought Content: Thought content normal.        Judgment: Judgment normal.     Results for orders placed or performed in visit on 02/24/19  VITAMIN D 25 Hydroxy (Vit-D Deficiency, Fractures)  Result Value Ref Range    Vit D, 25-Hydroxy 23.2 (L) 30.0 - 100.0 ng/mL  TSH  Result Value Ref Range   TSH 2.960 0.450 - 4.500 uIU/mL  Lipid Panel w/o Chol/HDL Ratio  Result Value Ref Range   Cholesterol, Total 230 (H) 100 - 199 mg/dL   Triglycerides 325 (H) 0 - 149 mg/dL   HDL 31 (L) >39 mg/dL   VLDL Cholesterol Cal 65 (H) 5 - 40 mg/dL   LDL Calculated 134 (H) 0 - 99 mg/dL  Comprehensive metabolic panel  Result Value Ref Range   Glucose 163 (H) 65 - 99 mg/dL   BUN 6 6 - 20 mg/dL   Creatinine, Ser 0.65 0.57 - 1.00 mg/dL   GFR calc non Af Amer 114 >59 mL/min/1.73   GFR calc Af Amer 131 >59 mL/min/1.73   BUN/Creatinine Ratio 9 9 - 23   Sodium 138 134 - 144 mmol/L   Potassium 4.6 3.5 - 5.2 mmol/L   Chloride 98 96 - 106 mmol/L   CO2 21 20 - 29 mmol/L   Calcium 9.2 8.7 - 10.2 mg/dL   Total Protein 6.9 6.0 - 8.5 g/dL   Albumin 4.0 3.8 - 4.8 g/dL   Globulin, Total 2.9 1.5 - 4.5 g/dL   Albumin/Globulin Ratio 1.4 1.2 - 2.2   Bilirubin Total 0.3 0.0 - 1.2 mg/dL   Alkaline Phosphatase 152 (H) 39 - 117 IU/L   AST 160 (H) 0 - 40 IU/L   ALT 53 (H) 0 - 32 IU/L  CBC with Differential/Platelet  Result Value Ref Range   WBC 10.4 3.4 - 10.8 x10E3/uL   RBC 5.00 3.77 - 5.28 x10E6/uL   Hemoglobin 12.6 11.1 - 15.9 g/dL   Hematocrit 38.8 34.0 - 46.6 %   MCV 78 (L) 79 - 97 fL   MCH 25.2 (L) 26.6 - 33.0 pg   MCHC 32.5 31.5 - 35.7 g/dL   RDW 15.4 11.7 - 15.4 %   Platelets 532 (H) 150 - 450 x10E3/uL   Neutrophils 70 Not Estab. %   Lymphs 22 Not Estab. %   Monocytes 5 Not Estab. %   Eos 1 Not Estab. %   Basos 1 Not Estab. %   Neutrophils Absolute 7.3 (H) 1.4 - 7.0 x10E3/uL   Lymphocytes Absolute 2.2 0.7 - 3.1 x10E3/uL   Monocytes Absolute 0.5 0.1 - 0.9 x10E3/uL   EOS (ABSOLUTE) 0.2 0.0 - 0.4 x10E3/uL   Basophils Absolute 0.1 0.0 - 0.2 x10E3/uL   Immature Granulocytes 1 Not Estab. %   Immature Grans (Abs) 0.1 0.0 - 0.1 x10E3/uL      Assessment & Plan:   Problem List Items Addressed This Visit      Respiratory    Moderate persistent asthma without complication    Doing a bit worse. Will start spiriva during allergy season and continue symbicort and singulair. Call if not getting  better or getting worse. Continue to monitor.       Relevant Medications   tiotropium (SPIRIVA HANDIHALER) 18 MCG inhalation capsule   montelukast (SINGULAIR) 10 MG tablet   albuterol (VENTOLIN HFA) 108 (90 Base) MCG/ACT inhaler    Other Visit Diagnoses    Routine general medical examination at a health care facility    -  Primary   Vaccines up to date. Screening labs checked today. Pap through GYN. Call with any concerns. Continue diet and exercise.    Relevant Orders   CBC with Differential/Platelet   Bayer DCA Hb A1c Waived   Comprehensive metabolic panel   Lipid Panel w/o Chol/HDL Ratio   TSH   UA/M w/rflx Culture, Routine   Flu vaccine need       Flu shot given today.    Relevant Orders   Flu Vaccine QUAD 36+ mos IM (Completed)       Follow up plan: Return in about 6 months (around 01/16/2020).   LABORATORY TESTING:  - Pap smear: done elsewhere  IMMUNIZATIONS:   - Tdap: Tetanus vaccination status reviewed: last tetanus booster within 10 years. - Influenza: Administered today - Pneumovax: Up to date  PATIENT COUNSELING:   Advised to take 1 mg of folate supplement per day if capable of pregnancy.   Sexuality: Discussed sexually transmitted diseases, partner selection, use of condoms, avoidance of unintended pregnancy  and contraceptive alternatives.   Advised to avoid cigarette smoking.  I discussed with the patient that most people either abstain from alcohol or drink within safe limits (<=14/week and <=4 drinks/occasion for males, <=7/weeks and <= 3 drinks/occasion for females) and that the risk for alcohol disorders and other health effects rises proportionally with the number of drinks per week and how often a drinker exceeds daily limits.  Discussed cessation/primary prevention of drug use and  availability of treatment for abuse.   Diet: Encouraged to adjust caloric intake to maintain  or achieve ideal body weight, to reduce intake of dietary saturated fat and total fat, to limit sodium intake by avoiding high sodium foods and not adding table salt, and to maintain adequate dietary potassium and calcium preferably from fresh fruits, vegetables, and low-fat dairy products.    stressed the importance of regular exercise  Injury prevention: Discussed safety belts, safety helmets, smoke detector, smoking near bedding or upholstery.   Dental health: Discussed importance of regular tooth brushing, flossing, and dental visits.    NEXT PREVENTATIVE PHYSICAL DUE IN 1 YEAR. Return in about 6 months (around 01/16/2020).

## 2019-07-18 NOTE — Assessment & Plan Note (Signed)
Doing a bit worse. Will start spiriva during allergy season and continue symbicort and singulair. Call if not getting better or getting worse. Continue to monitor.

## 2019-07-19 LAB — COMPREHENSIVE METABOLIC PANEL
ALT: 26 IU/L (ref 0–32)
AST: 58 IU/L — ABNORMAL HIGH (ref 0–40)
Albumin/Globulin Ratio: 1.5 (ref 1.2–2.2)
Albumin: 4.2 g/dL (ref 3.8–4.8)
Alkaline Phosphatase: 168 IU/L — ABNORMAL HIGH (ref 39–117)
BUN/Creatinine Ratio: 11 (ref 9–23)
BUN: 7 mg/dL (ref 6–20)
Bilirubin Total: 0.2 mg/dL (ref 0.0–1.2)
CO2: 24 mmol/L (ref 20–29)
Calcium: 9.6 mg/dL (ref 8.7–10.2)
Chloride: 99 mmol/L (ref 96–106)
Creatinine, Ser: 0.65 mg/dL (ref 0.57–1.00)
GFR calc Af Amer: 130 mL/min/{1.73_m2} (ref >59)
GFR calc non Af Amer: 113 mL/min/{1.73_m2} (ref >59)
Globulin, Total: 2.8 g/dL (ref 1.5–4.5)
Glucose: 173 mg/dL — ABNORMAL HIGH (ref 65–99)
Potassium: 5.2 mmol/L (ref 3.5–5.2)
Sodium: 136 mmol/L (ref 134–144)
Total Protein: 7 g/dL (ref 6.0–8.5)

## 2019-07-19 LAB — CBC WITH DIFFERENTIAL/PLATELET
Basophils Absolute: 0.1 10*3/uL (ref 0.0–0.2)
Basos: 1 %
EOS (ABSOLUTE): 0.1 10*3/uL (ref 0.0–0.4)
Eos: 1 %
Hematocrit: 37.5 % (ref 34.0–46.6)
Hemoglobin: 12.4 g/dL (ref 11.1–15.9)
Immature Grans (Abs): 0.1 10*3/uL (ref 0.0–0.1)
Immature Granulocytes: 1 %
Lymphocytes Absolute: 2.6 10*3/uL (ref 0.7–3.1)
Lymphs: 24 %
MCH: 25.9 pg — ABNORMAL LOW (ref 26.6–33.0)
MCHC: 33.1 g/dL (ref 31.5–35.7)
MCV: 79 fL (ref 79–97)
Monocytes Absolute: 0.5 10*3/uL (ref 0.1–0.9)
Monocytes: 5 %
Neutrophils Absolute: 7.6 10*3/uL — ABNORMAL HIGH (ref 1.4–7.0)
Neutrophils: 68 %
Platelets: 593 10*3/uL — ABNORMAL HIGH (ref 150–450)
RBC: 4.78 x10E6/uL (ref 3.77–5.28)
RDW: 14.3 % (ref 11.7–15.4)
WBC: 11 10*3/uL — ABNORMAL HIGH (ref 3.4–10.8)

## 2019-07-19 LAB — LIPID PANEL W/O CHOL/HDL RATIO
Cholesterol, Total: 247 mg/dL — ABNORMAL HIGH (ref 100–199)
HDL: 33 mg/dL — ABNORMAL LOW (ref >39)
LDL Chol Calc (NIH): 150 mg/dL — ABNORMAL HIGH (ref 0–99)
Triglycerides: 341 mg/dL — ABNORMAL HIGH (ref 0–149)
VLDL Cholesterol Cal: 64 mg/dL — ABNORMAL HIGH (ref 5–40)

## 2019-07-19 LAB — TSH: TSH: 2.46 u[IU]/mL (ref 0.450–4.500)

## 2019-07-26 ENCOUNTER — Other Ambulatory Visit: Payer: Self-pay

## 2019-07-26 ENCOUNTER — Encounter: Payer: Self-pay | Admitting: Family Medicine

## 2019-07-26 ENCOUNTER — Ambulatory Visit (INDEPENDENT_AMBULATORY_CARE_PROVIDER_SITE_OTHER): Payer: Managed Care, Other (non HMO) | Admitting: Family Medicine

## 2019-07-26 DIAGNOSIS — E119 Type 2 diabetes mellitus without complications: Secondary | ICD-10-CM | POA: Diagnosis not present

## 2019-07-26 DIAGNOSIS — J020 Streptococcal pharyngitis: Secondary | ICD-10-CM

## 2019-07-26 HISTORY — DX: Type 2 diabetes mellitus without complications: E11.9

## 2019-07-26 MED ORDER — AMOXICILLIN-POT CLAVULANATE 875-125 MG PO TABS: 1.0000 | ORAL_TABLET | Freq: Two times a day (BID) | ORAL | 0 refills | Status: DC

## 2019-07-26 NOTE — Progress Notes (Signed)
There were no vitals taken for this visit.   Subjective:    Patient ID: Ashley Kidd, female    DOB: 1980-11-03, 38 y.o.   MRN: [id number]  HPI: Ashley Kidd is a 38 y.o. female  Chief Complaint  Patient presents with  . Sore Throat    swollen glands, difficulity swallowing   UPPER RESPIRATORY TRACT INFECTION Duration: 4 days Worst symptom: sore throat Fever: no Cough: no Shortness of breath: yes Wheezing: no Chest pain: no Chest tightness: no Chest congestion: no Nasal congestion: no Runny nose: no Post nasal drip: no Sneezing: no Sore throat: yes Swollen glands: yes Sinus pressure: no Headache: no Face pain: no Toothache: no Ear pain: no  Ear pressure: no  Eyes red/itching:no Eye drainage/crusting: no  Vomiting: no Rash: no Fatigue: yes Sick contacts: no Strep contacts: no  Context: worse Recurrent sinusitis: no Relief with OTC cold/cough medications: no  Treatments attempted: anti-histamine   Relevant past medical, surgical, family and social history reviewed and updated as indicated. Interim medical history since our last visit reviewed. Allergies and medications reviewed and updated.  Review of Systems  Constitutional: Negative.   HENT: Positive for sore throat and trouble swallowing. Negative for congestion, dental problem, drooling, ear discharge, ear pain, facial swelling, hearing loss, mouth sores, nosebleeds, postnasal drip, rhinorrhea, sinus pressure, sinus pain, sneezing, tinnitus and voice change.   Respiratory: Negative.   Cardiovascular: Negative.   Neurological: Negative.   Hematological: Positive for adenopathy. Does not bruise/bleed easily.  Psychiatric/Behavioral: Negative.     Per HPI unless specifically indicated above     Objective:    There were no vitals taken for this visit.  Wt Readings from Last 3 Encounters:  07/18/19 218 lb (98.9 kg)  11/18/18 228 lb 6.4 oz (103.6 kg)  08/08/18 224 lb 9.6 oz (101.9 kg)     Physical Exam Vitals signs and nursing note reviewed.  Constitutional:      General: She is not in acute distress.    Appearance: Normal appearance. She is obese. She is not ill-appearing, toxic-appearing or diaphoretic.  HENT:     Head: Normocephalic and atraumatic.     Right Ear: External ear normal.     Left Ear: External ear normal.     Nose: Nose normal.     Mouth/Throat:     Mouth: Mucous membranes are moist.     Pharynx: Oropharynx is clear.     Comments: Swollen, erythematous tonsils Eyes:     General: No scleral icterus.       Right eye: No discharge.        Left eye: No discharge.     Conjunctiva/sclera: Conjunctivae normal.     Pupils: Pupils are equal, round, and reactive to light.  Neck:     Musculoskeletal: Normal range of motion.  Pulmonary:     Effort: Pulmonary effort is normal. No respiratory distress.     Comments: Speaking in full sentences Musculoskeletal: Normal range of motion.  Skin:    Coloration: Skin is not jaundiced or pale.     Findings: No bruising, erythema, lesion or rash.  Neurological:     Mental Status: She is alert and oriented to person, place, and time. Mental status is at baseline.  Psychiatric:        Mood and Affect: Mood normal.        Behavior: Behavior normal.        Thought Content: Thought content normal.  Judgment: Judgment normal.     Results for orders placed or performed in visit on 07/18/19  CBC with Differential/Platelet  Result Value Ref Range   WBC 11.0 (H) 3.4 - 10.8 x10E3/uL   RBC 4.78 3.77 - 5.28 x10E6/uL   Hemoglobin 12.4 11.1 - 15.9 g/dL   Hematocrit 37.5 34.0 - 46.6 %   MCV 79 79 - 97 fL   MCH 25.9 (L) 26.6 - 33.0 pg   MCHC 33.1 31.5 - 35.7 g/dL   RDW 14.3 11.7 - 15.4 %   Platelets 593 (H) 150 - 450 x10E3/uL   Neutrophils 68 Not Estab. %   Lymphs 24 Not Estab. %   Monocytes 5 Not Estab. %   Eos 1 Not Estab. %   Basos 1 Not Estab. %   Neutrophils Absolute 7.6 (H) 1.4 - 7.0 x10E3/uL   Lymphocytes  Absolute 2.6 0.7 - 3.1 x10E3/uL   Monocytes Absolute 0.5 0.1 - 0.9 x10E3/uL   EOS (ABSOLUTE) 0.1 0.0 - 0.4 x10E3/uL   Basophils Absolute 0.1 0.0 - 0.2 x10E3/uL   Immature Granulocytes 1 Not Estab. %   Immature Grans (Abs) 0.1 0.0 - 0.1 x10E3/uL  Bayer DCA Hb A1c Waived  Result Value Ref Range   HB A1C (BAYER DCA - WAIVED) 8.2 (H) <7.0 %  Comprehensive metabolic panel  Result Value Ref Range   Glucose 173 (H) 65 - 99 mg/dL   BUN 7 6 - 20 mg/dL   Creatinine, Ser 0.65 0.57 - 1.00 mg/dL   GFR calc non Af Amer 113 >59 mL/min/1.73   GFR calc Af Amer 130 >59 mL/min/1.73   BUN/Creatinine Ratio 11 9 - 23   Sodium 136 134 - 144 mmol/L   Potassium 5.2 3.5 - 5.2 mmol/L   Chloride 99 96 - 106 mmol/L   CO2 24 20 - 29 mmol/L   Calcium 9.6 8.7 - 10.2 mg/dL   Total Protein 7.0 6.0 - 8.5 g/dL   Albumin 4.2 3.8 - 4.8 g/dL   Globulin, Total 2.8 1.5 - 4.5 g/dL   Albumin/Globulin Ratio 1.5 1.2 - 2.2   Bilirubin Total 0.2 0.0 - 1.2 mg/dL   Alkaline Phosphatase 168 (H) 39 - 117 IU/L   AST 58 (H) 0 - 40 IU/L   ALT 26 0 - 32 IU/L  Lipid Panel w/o Chol/HDL Ratio  Result Value Ref Range   Cholesterol, Total 247 (H) 100 - 199 mg/dL   Triglycerides 341 (H) 0 - 149 mg/dL   HDL 33 (L) >39 mg/dL   VLDL Cholesterol Cal 64 (H) 5 - 40 mg/dL   LDL Chol Calc (NIH) 150 (H) 0 - 99 mg/dL  TSH  Result Value Ref Range   TSH 2.460 0.450 - 4.500 uIU/mL  UA/M w/rflx Culture, Routine   Specimen: Blood   BLD  Result Value Ref Range   Specific Gravity, UA 1.015 1.005 - 1.030   pH, UA 7.0 5.0 - 7.5   Color, UA Yellow Yellow   Appearance Ur Clear Clear   Leukocytes,UA Negative Negative   Protein,UA Negative Negative/Trace   Glucose, UA Negative Negative   Ketones, UA Negative Negative   RBC, UA Negative Negative   Bilirubin, UA Negative Negative   Urobilinogen, Ur 0.2 0.2 - 1.0 mg/dL   Nitrite, UA Negative Negative      Assessment & Plan:   Problem List Items Addressed This Visit      Endocrine   Type 2  diabetes mellitus without complication, without long-term current  use of insulin ([place])    Newly diagnosed on screening labs. Would like to meet with diabetic educator and work on diet and exercise for 3 months and then recheck. We will do this. Call with any concerns. Continue to monitor.       Relevant Orders   Ambulatory referral to diabetic education    Other Visit Diagnoses    Strep pharyngitis    -  Primary   Will treat with augmentin. Call if not getting better or getting worse. Continue to monitor.        Follow up plan: Return in about 3 months (around 10/26/2019) for follow up DM.    Marland Kitchen This visit was completed via FaceTime due to the restrictions of the COVID-19 pandemic. All issues as above were discussed and addressed. Physical exam was done as above through visual confirmation on FaceTime. If it was felt that the patient should be evaluated in the office, they were directed there. The patient verbally consented to this visit. . Location of the patient: home . Location of the provider: home . Those involved with this call:  . Provider: Park Liter, DO . CMA: Tiffany Reel, CMA . Front Desk/Registration: Don Perking  . Time spent on call: 25 minutes with patient face to face via video conference. More than 50% of this time was spent in counseling and coordination of care. 40 minutes total spent in review of patient's record and preparation of their chart.

## 2019-07-26 NOTE — Assessment & Plan Note (Signed)
Newly diagnosed on screening labs. Would like to meet with diabetic educator and work on diet and exercise for 3 months and then recheck. We will do this. Call with any concerns. Continue to monitor.

## 2019-08-01 ENCOUNTER — Encounter: Payer: Self-pay | Admitting: Family Medicine

## 2019-08-02 MED ORDER — FLUCONAZOLE 150 MG PO TABS: 150.0000 mg | ORAL_TABLET | Freq: Once | ORAL | 0 refills | Status: AC

## 2019-08-28 ENCOUNTER — Other Ambulatory Visit: Payer: Self-pay

## 2019-08-28 ENCOUNTER — Encounter: Payer: Self-pay | Admitting: Family Medicine

## 2019-08-28 ENCOUNTER — Encounter: Payer: Managed Care, Other (non HMO) | Attending: Family Medicine | Admitting: Dietician

## 2019-08-28 ENCOUNTER — Encounter: Payer: Self-pay | Admitting: Dietician

## 2019-08-28 VITALS — BP 120/82 | Ht 67.0 in | Wt 215.3 lb

## 2019-08-28 DIAGNOSIS — Z713 Dietary counseling and surveillance: Secondary | ICD-10-CM | POA: Diagnosis present

## 2019-08-28 DIAGNOSIS — E119 Type 2 diabetes mellitus without complications: Secondary | ICD-10-CM | POA: Diagnosis not present

## 2019-08-28 DIAGNOSIS — Z6833 Body mass index (BMI) 33.0-33.9, adult: Secondary | ICD-10-CM | POA: Diagnosis not present

## 2019-08-28 NOTE — Progress Notes (Signed)
Diabetes Self-Management Education  Visit Type: First/Initial  Appt. Start Time: 1330 Appt. End Time: 1430  08/28/2019  [name], identified by name and date of birth, is a 38 y.o. female with a diagnosis of Diabetes: Type 2.   ASSESSMENT  Blood pressure 120/82, height 5\' 7"  (1.702 m), weight 215 lb 4.8 oz (97.7 kg). Body mass index is 33.72 kg/m.  Diabetes Self-Management Education - 08/28/19 1449      Visit Information   Visit Type  First/Initial      Initial Visit   Diabetes Type  Type 2      Health Coping   How would you rate your overall health?  Good      Psychosocial Assessment   Patient Belief/Attitude about Diabetes  Motivated to manage diabetes    Self-care barriers  None    Self-management support  Doctor's office;Family    Other persons present  Patient    Patient Concerns  Medication;Healthy Lifestyle;Glycemic Control;Weight Control   prevent complications   Special Needs  None    Preferred Learning Style  Visual;Auditory    Learning Readiness  Ready    What is the last grade level you completed in school?  some college      Pre-Education Assessment   Patient understands the diabetes disease and treatment process.  Needs Instruction    Patient understands incorporating nutritional management into lifestyle.  Needs Instruction    Patient undertands incorporating physical activity into lifestyle.  Needs Instruction    Patient understands using medications safely.  Needs Instruction    Patient understands monitoring blood glucose, interpreting and using results  Needs Instruction    Patient understands prevention, detection, and treatment of acute complications.  Needs Instruction    Patient understands prevention, detection, and treatment of chronic complications.  Needs Instruction    Patient understands how to develop strategies to address psychosocial issues.  Needs Instruction    Patient understands how to develop strategies to promote  health/change behavior.  Needs Instruction      Complications   Last HgB A1C per patient/outside source  8.2 %   07-18-19   How often do you check your blood sugar?  0 times/day (not testing)    Have you had a dilated eye exam in the past 12 months?  Yes   11-12-18   Have you had a dental exam in the past 12 months?  Yes   02-2019   Are you checking your feet?  No      Dietary Intake   Breakfast  eats oatmeal with walnuts & brown sugar or eggs and pancakes at 8:30-9a    Snack (morning)  none    Lunch  eats out 5x/wk-usually eats kids meal or a salad from fast food restaurant at 12p-1p 9eats fried foods and snack foods 2-3x/wk)    Snack (afternoon)  eats fruit or dry cereal at 2-3p    Dinner  eats meat and vegetables for supper-time varies 6p-8p    Snack (evening)  none    Beverage(s)  drinks water 8+x/day, fruit juice 0-1x/day and milk 3x/day      Exercise   Exercise Type  Light (walking / raking leaves)    How many days per week to you exercise?  1.5    How many minutes per day do you exercise?  70    Total minutes per week of exercise  105      Patient Education   Previous Diabetes Education  No  Disease state   Definition of diabetes, type 1 and 2, and the diagnosis of diabetes;Explored patient's options for treatment of their diabetes    Nutrition management   Role of diet in the treatment of diabetes and the relationship between the three main macronutrients and blood glucose level;Food label reading, portion sizes and measuring food.;Carbohydrate counting    Physical activity and exercise   Role of exercise on diabetes management, blood pressure control and cardiac health.;Helped patient identify appropriate exercises in relation to his/her diabetes, diabetes complications and other health issue.    Monitoring  Taught/evaluated SMBG meter.;Purpose and frequency of SMBG.;Taught/discussed recording of test results and interpretation of SMBG.;Identified appropriate SMBG and/or A1C  goals.;Yearly dilated eye exam   gave pt One Touch Verio Flex meter and instructed on its use-BG 181 (2 hr pp)   Chronic complications  Relationship between chronic complications and blood glucose control;Dental care;Retinopathy and reason for yearly dilated eye exams    Personal strategies to promote health  Lifestyle issues that need to be addressed for better diabetes care;Helped patient develop diabetes management plan for (enter comment)      Outcomes   Expected Outcomes  Demonstrated interest in learning. Expect positive outcomes       Individualized Plan for Diabetes Self-Management Training:   Learning Objective:  Patient will have a greater understanding of diabetes self-management. Patient education plan is to attend individual and/or group sessions per assessed needs and concerns.   Plan:   Patient Instructions   Check blood sugars 2 x day before breakfast and 2 hrs after supper every day  Bring blood sugar records to the next appointment/class  Call your doctor for a prescription for:  1. Meter strips (type) One Touch Verio test strips checking  2x/day  2. Lancets (type) One Touch Delica Plus Lancets checking 2x/day    Exercise:  Increase walking to 30-60 min 3x/wk  Eat 3 meals day 1  snack a day at bedtime   Space meals 4-6 hours apart  Avoid sugar sweetened drinks (soda, tea, coffee, sports drinks, juices)  Drink plenty of water  Limit intake of sweets, fried foods and snack foods  Eat 2-3 carbohydrate servings/meal + protein  Eat 1 carbohydrate serving/snack + protein  Get a Sharps container  Return for appointment/classes on: 09-04-19   Expected Outcomes:  Demonstrated interest in learning. Expect positive outcomes  Education material provided: General meal planning guidelines, Food Group handout, One Touch Verio Flex meter  If problems or questions, patient to contact team via: [phone]  Future DSME appointment:  09-04-19

## 2019-08-28 NOTE — Patient Instructions (Signed)
  Check blood sugars 2 x day before breakfast and 2 hrs after supper every day  Bring blood sugar records to the next appointment/class  Call your doctor for a prescription for:  1. Meter strips (type) One Touch Verio test strips checking  2x/day  2. Lancets (type) One Touch Delica Plus Lancets checking 2x/day    Exercise:  Increase walking to 30-60 min 3x/wk  Eat 3 meals day 1  snack a day at bedtime   Space meals 4-6 hours apart  Avoid sugar sweetened drinks (soda, tea, coffee, sports drinks, juices)  Drink plenty of water  Limit intake of sweets, fried foods and snack foods  Eat 2-3 carbohydrate servings/meal + protein  Eat 1 carbohydrate serving/snack + protein  Get a Sharps container  Return for appointment/classes on: 09-04-19

## 2019-08-31 ENCOUNTER — Encounter: Payer: Self-pay | Admitting: Family Medicine

## 2019-09-04 ENCOUNTER — Encounter: Payer: Managed Care, Other (non HMO) | Attending: Family Medicine | Admitting: Dietician

## 2019-09-04 ENCOUNTER — Other Ambulatory Visit: Payer: Self-pay

## 2019-09-04 ENCOUNTER — Encounter: Payer: Self-pay | Admitting: Dietician

## 2019-09-04 VITALS — Ht 67.0 in | Wt 211.3 lb

## 2019-09-04 DIAGNOSIS — E119 Type 2 diabetes mellitus without complications: Secondary | ICD-10-CM | POA: Diagnosis not present

## 2019-09-04 DIAGNOSIS — Z6833 Body mass index (BMI) 33.0-33.9, adult: Secondary | ICD-10-CM | POA: Diagnosis not present

## 2019-09-04 DIAGNOSIS — Z713 Dietary counseling and surveillance: Secondary | ICD-10-CM | POA: Insufficient documentation

## 2019-09-04 NOTE — Progress Notes (Signed)

## 2019-09-11 ENCOUNTER — Other Ambulatory Visit: Payer: Self-pay

## 2019-09-11 ENCOUNTER — Encounter: Payer: Self-pay | Admitting: Dietician

## 2019-09-11 ENCOUNTER — Encounter: Payer: Managed Care, Other (non HMO) | Admitting: Dietician

## 2019-09-11 VITALS — Wt 207.3 lb

## 2019-09-11 DIAGNOSIS — Z713 Dietary counseling and surveillance: Secondary | ICD-10-CM | POA: Diagnosis not present

## 2019-09-11 DIAGNOSIS — E119 Type 2 diabetes mellitus without complications: Secondary | ICD-10-CM

## 2019-09-11 NOTE — Progress Notes (Signed)

## 2019-09-18 ENCOUNTER — Encounter: Payer: Managed Care, Other (non HMO) | Admitting: Dietician

## 2019-09-18 ENCOUNTER — Encounter: Payer: Self-pay | Admitting: Dietician

## 2019-09-18 VITALS — BP 126/80 | Ht 67.0 in | Wt 209.4 lb

## 2019-09-18 DIAGNOSIS — Z713 Dietary counseling and surveillance: Secondary | ICD-10-CM | POA: Diagnosis not present

## 2019-09-18 DIAGNOSIS — E119 Type 2 diabetes mellitus without complications: Secondary | ICD-10-CM

## 2019-09-18 NOTE — Progress Notes (Signed)

## 2019-09-19 ENCOUNTER — Other Ambulatory Visit: Payer: Self-pay

## 2019-09-19 ENCOUNTER — Encounter: Payer: Self-pay | Admitting: Dietician

## 2019-12-01 LAB — HM DIABETES EYE EXAM

## 2019-12-04 ENCOUNTER — Telehealth (INDEPENDENT_AMBULATORY_CARE_PROVIDER_SITE_OTHER): Payer: Managed Care, Other (non HMO) | Admitting: Family Medicine

## 2019-12-04 ENCOUNTER — Encounter: Payer: Self-pay | Admitting: Family Medicine

## 2019-12-04 ENCOUNTER — Telehealth: Payer: Managed Care, Other (non HMO) | Admitting: Family Medicine

## 2019-12-04 DIAGNOSIS — J0100 Acute maxillary sinusitis, unspecified: Secondary | ICD-10-CM | POA: Diagnosis not present

## 2019-12-04 MED ORDER — PREDNISONE 50 MG PO TABS: 50.0000 mg | ORAL_TABLET | Freq: Every day | ORAL | 0 refills | Status: DC

## 2019-12-04 MED ORDER — AMOXICILLIN-POT CLAVULANATE 875-125 MG PO TABS: 1.0000 | ORAL_TABLET | Freq: Two times a day (BID) | ORAL | 0 refills | Status: DC

## 2019-12-04 NOTE — Progress Notes (Signed)
There were no vitals taken for this visit.   Subjective:    Patient ID: Ashley Kidd, female    DOB: 1981/08/22, 39 y.o.   MRN: [id number]  HPI: Ashley Kidd is a 39 y.o. female  Chief Complaint  Patient presents with  . Ear Pain    Bilateral off and on X 2 weeks, has tried otc meds not helping, has some sinus congestion as well   UPPER RESPIRATORY TRACT INFECTION Duration: 2 weeks Worst symptom: congested, ear pain Fever: no Cough: no Shortness of breath: no Wheezing: no Chest pain: no Chest tightness: no Chest congestion: no Nasal congestion: yes Runny nose: yes Post nasal drip: yes Sneezing: yes Sore throat: yes Swollen glands: no Sinus pressure: yes Headache: yes Face pain: yes Toothache: no Ear pain: yes bilateral Ear pressure: yes bilateral Eyes red/itching:no Eye drainage/crusting: no  Vomiting: no Rash: no Fatigue: yes Sick contacts: no Strep contacts: no  Context: worse Recurrent sinusitis: no Relief with OTC cold/cough medications: no  Treatments attempted: mucinex and pseudoephedrine   Relevant past medical, surgical, family and social history reviewed and updated as indicated. Interim medical history since our last visit reviewed. Allergies and medications reviewed and updated.  Review of Systems  Constitutional: Positive for fatigue. Negative for activity change, appetite change, chills, diaphoresis, fever and unexpected weight change.  HENT: Positive for congestion, ear pain, postnasal drip, rhinorrhea, sinus pressure, sinus pain and sore throat. Negative for dental problem, drooling, ear discharge, facial swelling, hearing loss, mouth sores, nosebleeds, sneezing, tinnitus, trouble swallowing and voice change.   Eyes: Negative.   Respiratory: Negative.  Negative for apnea, cough, choking, chest tightness, shortness of breath, wheezing and stridor.   Cardiovascular: Negative.   Gastrointestinal: Negative.   Psychiatric/Behavioral:  Negative.     Per HPI unless specifically indicated above     Objective:    There were no vitals taken for this visit.  Wt Readings from Last 3 Encounters:  09/18/19 209 lb 6.4 oz (95 kg)  09/11/19 207 lb 4.8 oz (94 kg)  09/04/19 211 lb 4.8 oz (95.8 kg)    Physical Exam Vitals and nursing note reviewed.  Constitutional:      General: She is not in acute distress.    Appearance: Normal appearance. She is obese. She is not ill-appearing, toxic-appearing or diaphoretic.  HENT:     Head: Normocephalic and atraumatic.     Right Ear: External ear normal.     Left Ear: External ear normal.     Nose: Congestion and rhinorrhea present.     Mouth/Throat:     Mouth: Mucous membranes are moist.     Pharynx: Oropharynx is clear.  Eyes:     General: No scleral icterus.       Right eye: No discharge.        Left eye: No discharge.     Conjunctiva/sclera: Conjunctivae normal.     Pupils: Pupils are equal, round, and reactive to light.  Pulmonary:     Effort: Pulmonary effort is normal. No respiratory distress.     Comments: Speaking in full sentences Musculoskeletal:        General: Normal range of motion.     Cervical back: Normal range of motion.  Skin:    Coloration: Skin is not jaundiced or pale.     Findings: No bruising, erythema, lesion or rash.  Neurological:     Mental Status: She is alert and oriented to person, place, and time. Mental status  is at baseline.  Psychiatric:        Mood and Affect: Mood normal.        Behavior: Behavior normal.        Thought Content: Thought content normal.        Judgment: Judgment normal.     Results for orders placed or performed in visit on 08/31/19  HM PAP SMEAR  Result Value Ref Range   HM Pap smear Negative Pap- no HPV result       Assessment & Plan:   Problem List Items Addressed This Visit    None    Visit Diagnoses    Acute non-recurrent maxillary sinusitis    -  Primary   >2 weeks, so will not test for COVID. Will  treat with augmentin and prednisone. Call with any concerns. Continue to monitor.    Relevant Medications   amoxicillin-clavulanate (AUGMENTIN) 875-125 MG tablet   predniSONE (DELTASONE) 50 MG tablet       Follow up plan: Return if symptoms worsen or fail to improve.   . This visit was completed via MyChart due to the restrictions of the COVID-19 pandemic. All issues as above were discussed and addressed. Physical exam was done as above through visual confirmation on MyChart. If it was felt that the patient should be evaluated in the office, they were directed there. The patient verbally consented to this visit. . Location of the patient: home . Location of the provider: work . Those involved with this call:  . Provider: Park Liter, DO . CMA: Tiffany Reel, CMA . Front Desk/Registration: Don Perking  . Time spent on call: 15 minutes with patient face to face via video conference. More than 50% of this time was spent in counseling and coordination of care. 23 minutes total spent in review of patient's record and preparation of their chart.

## 2020-01-16 ENCOUNTER — Ambulatory Visit (INDEPENDENT_AMBULATORY_CARE_PROVIDER_SITE_OTHER): Payer: Managed Care, Other (non HMO) | Admitting: Family Medicine

## 2020-01-16 ENCOUNTER — Other Ambulatory Visit: Payer: Self-pay

## 2020-01-16 ENCOUNTER — Encounter: Payer: Self-pay | Admitting: Family Medicine

## 2020-01-16 VITALS — BP 124/81 | HR 82 | Temp 98.4°F | Wt 200.1 lb

## 2020-01-16 DIAGNOSIS — G43709 Chronic migraine without aura, not intractable, without status migrainosus: Secondary | ICD-10-CM | POA: Diagnosis not present

## 2020-01-16 DIAGNOSIS — T7840XD Allergy, unspecified, subsequent encounter: Secondary | ICD-10-CM

## 2020-01-16 DIAGNOSIS — E119 Type 2 diabetes mellitus without complications: Secondary | ICD-10-CM | POA: Diagnosis not present

## 2020-01-16 DIAGNOSIS — J4540 Moderate persistent asthma, uncomplicated: Secondary | ICD-10-CM

## 2020-01-16 LAB — MICROALBUMIN, URINE WAIVED
Creatinine, Urine Waived: 50 mg/dL (ref 10–300)
Microalb, Ur Waived: 10 mg/L (ref 0–19)

## 2020-01-16 LAB — BAYER DCA HB A1C WAIVED: HB A1C (BAYER DCA - WAIVED): 6.9 % (ref <7.0)

## 2020-01-16 MED ORDER — FLUTICASONE PROPIONATE 50 MCG/ACT NA SUSP: NASAL | 12 refills | Status: DC

## 2020-01-16 MED ORDER — SUMATRIPTAN SUCCINATE 100 MG PO TABS: 100.0000 mg | ORAL_TABLET | ORAL | 12 refills | Status: DC | PRN

## 2020-01-16 MED ORDER — ALBUTEROL SULFATE HFA 108 (90 BASE) MCG/ACT IN AERS: INHALATION_SPRAY | RESPIRATORY_TRACT | 6 refills | Status: DC

## 2020-01-16 MED ORDER — MONTELUKAST SODIUM 10 MG PO TABS: ORAL_TABLET | ORAL | 1 refills | Status: DC

## 2020-01-16 MED ORDER — BUDESONIDE-FORMOTEROL FUMARATE 160-4.5 MCG/ACT IN AERO: 2.0000 | INHALATION_SPRAY | Freq: Two times a day (BID) | RESPIRATORY_TRACT | 11 refills | Status: DC

## 2020-01-16 MED ORDER — UMECLIDINIUM BROMIDE 62.5 MCG/INH IN AEPB: 1.0000 | INHALATION_SPRAY | Freq: Every day | RESPIRATORY_TRACT | 6 refills | Status: DC

## 2020-01-16 MED ORDER — OMEPRAZOLE 40 MG PO CPDR: 40.0000 mg | DELAYED_RELEASE_CAPSULE | Freq: Every day | ORAL | 1 refills | Status: DC

## 2020-01-16 NOTE — Progress Notes (Signed)
BP 124/81 (BP Location: Left Arm, Patient Position: Sitting, Cuff Size: Normal)   Pulse 82   Temp 98.4 F (36.9 C) (Oral)   Wt 200 lb 2 oz (90.8 kg)   SpO2 99%   BMI 31.34 kg/m    Subjective:    Patient ID: Ashley Kidd, female    DOB: 1981-01-09, 39 y.o.   MRN: [id number]  HPI: Ashley Kidd is a 39 y.o. female  Chief Complaint  Patient presents with  . Diabetes   DIABETES Hypoglycemic episodes:no Polydipsia/polyuria: no Visual disturbance: no Chest pain: no Paresthesias: no Glucose Monitoring: yes  Accucheck frequency: BID Taking Insulin?: no Blood Pressure Monitoring: not checking Retinal Examination: Not up to Date Foot Exam: Done today Diabetic Education: Completed Pneumovax: Up to Date Influenza: Up to Date Aspirin: no  Headaches have been doing OK.   Breathing is doing great! Tolerating medicine well. No concerns. No other concerns or complaitns at this time.   Relevant past medical, surgical, family and social history reviewed and updated as indicated. Interim medical history since our last visit reviewed. Allergies and medications reviewed and updated.  Review of Systems  Constitutional: Negative.   Respiratory: Negative.   Cardiovascular: Negative.   Musculoskeletal: Negative.   Neurological: Negative.   Psychiatric/Behavioral: Negative.     Per HPI unless specifically indicated above     Objective:    BP 124/81 (BP Location: Left Arm, Patient Position: Sitting, Cuff Size: Normal)   Pulse 82   Temp 98.4 F (36.9 C) (Oral)   Wt 200 lb 2 oz (90.8 kg)   SpO2 99%   BMI 31.34 kg/m   Wt Readings from Last 3 Encounters:  01/16/20 200 lb 2 oz (90.8 kg)  09/18/19 209 lb 6.4 oz (95 kg)  09/11/19 207 lb 4.8 oz (94 kg)    Physical Exam Vitals and nursing note reviewed.  Constitutional:      General: She is not in acute distress.    Appearance: Normal appearance. She is not ill-appearing, toxic-appearing or diaphoretic.  HENT:   Head: Normocephalic and atraumatic.     Right Ear: External ear normal.     Left Ear: External ear normal.     Nose: Nose normal.     Mouth/Throat:     Mouth: Mucous membranes are moist.     Pharynx: Oropharynx is clear.  Eyes:     General: No scleral icterus.       Right eye: No discharge.        Left eye: No discharge.     Extraocular Movements: Extraocular movements intact.     Conjunctiva/sclera: Conjunctivae normal.     Pupils: Pupils are equal, round, and reactive to light.  Cardiovascular:     Rate and Rhythm: Normal rate and regular rhythm.     Pulses: Normal pulses.     Heart sounds: Normal heart sounds. No murmur. No friction rub. No gallop.   Pulmonary:     Effort: Pulmonary effort is normal. No respiratory distress.     Breath sounds: Normal breath sounds. No stridor. No wheezing, rhonchi or rales.  Chest:     Chest wall: No tenderness.  Musculoskeletal:        General: Normal range of motion.     Cervical back: Normal range of motion and neck supple.  Skin:    General: Skin is warm and dry.     Capillary Refill: Capillary refill takes less than 2 seconds.     Coloration: Skin  is not jaundiced or pale.     Findings: No bruising, erythema, lesion or rash.  Neurological:     General: No focal deficit present.     Mental Status: She is alert and oriented to person, place, and time. Mental status is at baseline.  Psychiatric:        Mood and Affect: Mood normal.        Behavior: Behavior normal.        Thought Content: Thought content normal.        Judgment: Judgment normal.     Results for orders placed or performed in visit on 08/31/19  HM PAP SMEAR  Result Value Ref Range   HM Pap smear Negative Pap- no HPV result       Assessment & Plan:   Problem List Items Addressed This Visit      Cardiovascular and Mediastinum   Migraine    Under good control on current regimen. Continue current regimen. Continue to monitor. Call with any concerns. Refills given.         Relevant Medications   SUMAtriptan (IMITREX) 100 MG tablet     Respiratory   Moderate persistent asthma without complication    Under good control on current regimen. Continue current regimen. Continue to monitor. Call with any concerns. Refills given.        Relevant Medications   montelukast (SINGULAIR) 10 MG tablet   umeclidinium bromide (INCRUSE ELLIPTA) 62.5 MCG/INH AEPB   budesonide-formoterol (SYMBICORT) 160-4.5 MCG/ACT inhaler   albuterol (VENTOLIN HFA) 108 (90 Base) MCG/ACT inhaler     Endocrine   Type 2 diabetes mellitus without complication, without long-term current use of insulin (Ada) - Primary    Doing great! Down 30lbs with diet and exercise. A1c down to 6.9! Continue diet and exercise. Continue to monitor. Recheck 6 months at physical.      Relevant Orders   Bayer DCA Hb A1c Waived   Comprehensive metabolic panel   Lipid Panel w/o Chol/HDL Ratio   Microalbumin, Urine Waived     Other   Allergy   Relevant Medications   fluticasone (FLONASE) 50 MCG/ACT nasal spray       Follow up plan: Return in about 6 months (around 07/17/2020) for Physical.

## 2020-01-16 NOTE — Assessment & Plan Note (Signed)
Doing great! Down 30lbs with diet and exercise. A1c down to 6.9! Continue diet and exercise. Continue to monitor. Recheck 6 months at physical.

## 2020-01-16 NOTE — Assessment & Plan Note (Signed)
Under good control on current regimen. Continue current regimen. Continue to monitor. Call with any concerns. Refills given.   

## 2020-01-17 LAB — COMPREHENSIVE METABOLIC PANEL
ALT: 21 IU/L (ref 0–32)
AST: 34 IU/L (ref 0–40)
Albumin/Globulin Ratio: 1.5 (ref 1.2–2.2)
Albumin: 4.1 g/dL (ref 3.8–4.8)
Alkaline Phosphatase: 168 IU/L — ABNORMAL HIGH (ref 39–117)
BUN/Creatinine Ratio: 10 (ref 9–23)
BUN: 7 mg/dL (ref 6–20)
Bilirubin Total: 0.2 mg/dL (ref 0.0–1.2)
CO2: 19 mmol/L — ABNORMAL LOW (ref 20–29)
Calcium: 9.5 mg/dL (ref 8.7–10.2)
Chloride: 102 mmol/L (ref 96–106)
Creatinine, Ser: 0.7 mg/dL (ref 0.57–1.00)
GFR calc Af Amer: 127 mL/min/{1.73_m2} (ref >59)
GFR calc non Af Amer: 110 mL/min/{1.73_m2} (ref >59)
Globulin, Total: 2.7 g/dL (ref 1.5–4.5)
Glucose: 151 mg/dL — ABNORMAL HIGH (ref 65–99)
Potassium: 4.9 mmol/L (ref 3.5–5.2)
Sodium: 138 mmol/L (ref 134–144)
Total Protein: 6.8 g/dL (ref 6.0–8.5)

## 2020-01-17 LAB — LIPID PANEL W/O CHOL/HDL RATIO
Cholesterol, Total: 243 mg/dL — ABNORMAL HIGH (ref 100–199)
HDL: 42 mg/dL (ref >39)
LDL Chol Calc (NIH): 152 mg/dL — ABNORMAL HIGH (ref 0–99)
Triglycerides: 264 mg/dL — ABNORMAL HIGH (ref 0–149)
VLDL Cholesterol Cal: 49 mg/dL — ABNORMAL HIGH (ref 5–40)

## 2020-03-13 ENCOUNTER — Telehealth: Payer: Managed Care, Other (non HMO) | Admitting: Physician Assistant

## 2020-03-13 DIAGNOSIS — J019 Acute sinusitis, unspecified: Secondary | ICD-10-CM

## 2020-03-13 MED ORDER — AMOXICILLIN-POT CLAVULANATE 875-125 MG PO TABS: 1.0000 | ORAL_TABLET | Freq: Two times a day (BID) | ORAL | 0 refills | Status: DC

## 2020-03-13 NOTE — Progress Notes (Signed)
We are sorry that you are not feeling well.  Here is how we plan to help!  Based on what you have shared with me it looks like you have sinusitis.  Sinusitis is inflammation and infection in the sinus cavities of the head.  Based on your presentation I believe you most likely have Acute Bacterial Sinusitis.  This is an infection caused by bacteria and is treated with antibiotics. I have prescribed Augmentin 875mg /125mg  one tablet twice daily with food, for 7 days. You may use an oral decongestant such as Mucinex D or if you have glaucoma or high blood pressure use plain Mucinex. Saline nasal spray help and can safely be used as often as needed for congestion.  If you develop worsening sinus pain, fever or notice severe headache and vision changes, or if symptoms are not better after completion of antibiotic, please schedule an appointment with a health care provider.    Sinus infections are not as easily transmitted as other respiratory infection, however we still recommend that you avoid close contact with loved ones, especially the very young and elderly.  Remember to wash your hands thoroughly throughout the day as this is the number one way to prevent the spread of infection!  Home Care:  Only take medications as instructed by your medical team.  Complete the entire course of an antibiotic.  Do not take these medications with alcohol.  A steam or ultrasonic humidifier can help congestion.  You can place a towel over your head and breathe in the steam from hot water coming from a faucet.  Avoid close contacts especially the very young and the elderly.  Cover your mouth when you cough or sneeze.  Always remember to wash your hands.  Get Help Right Away If:  You develop worsening fever or sinus pain.  You develop a severe head ache or visual changes.  Your symptoms persist after you have completed your treatment plan.  Make sure you  Understand these instructions.  Will watch your  condition.  Will get help right away if you are not doing well or get worse.  Your e-visit answers were reviewed by a board certified advanced clinical practitioner to complete your personal care plan.  Depending on the condition, your plan could have included both over the counter or prescription medications.  If there is a problem please reply  once you have received a response from your provider.  Your safety is important to [place].  If you have drug allergies check your prescription carefully.    You can use MyChart to ask questions about today's visit, request a non-urgent call back, or ask for a work or school excuse for 24 hours related to this e-Visit. If it has been greater than 24 hours you will need to follow up with your provider, or enter a new e-Visit to address those concerns.  You will get an e-mail in the next two days asking about your experience.  I hope that your e-visit has been valuable and will speed your recovery. Thank you for using e-visits.   5 minutes spent on chart

## 2020-07-18 ENCOUNTER — Ambulatory Visit (INDEPENDENT_AMBULATORY_CARE_PROVIDER_SITE_OTHER): Payer: Managed Care, Other (non HMO) | Admitting: Family Medicine

## 2020-07-18 ENCOUNTER — Other Ambulatory Visit: Payer: Self-pay

## 2020-07-18 ENCOUNTER — Encounter: Payer: Self-pay | Admitting: Family Medicine

## 2020-07-18 VITALS — BP 114/70 | HR 85 | Temp 98.1°F | Wt 194.8 lb

## 2020-07-18 DIAGNOSIS — E119 Type 2 diabetes mellitus without complications: Secondary | ICD-10-CM

## 2020-07-18 DIAGNOSIS — Z1159 Encounter for screening for other viral diseases: Secondary | ICD-10-CM | POA: Diagnosis not present

## 2020-07-18 DIAGNOSIS — J4540 Moderate persistent asthma, uncomplicated: Secondary | ICD-10-CM | POA: Diagnosis not present

## 2020-07-18 DIAGNOSIS — Z23 Encounter for immunization: Secondary | ICD-10-CM | POA: Diagnosis not present

## 2020-07-18 LAB — BAYER DCA HB A1C WAIVED: HB A1C (BAYER DCA - WAIVED): 6.3 % (ref <7.0)

## 2020-07-18 MED ORDER — ALBUTEROL SULFATE HFA 108 (90 BASE) MCG/ACT IN AERS: INHALATION_SPRAY | RESPIRATORY_TRACT | 6 refills | Status: DC

## 2020-07-18 MED ORDER — MONTELUKAST SODIUM 10 MG PO TABS: ORAL_TABLET | ORAL | 1 refills | Status: DC

## 2020-07-18 MED ORDER — UMECLIDINIUM BROMIDE 62.5 MCG/INH IN AEPB: 1.0000 | INHALATION_SPRAY | Freq: Every day | RESPIRATORY_TRACT | 6 refills | Status: DC

## 2020-07-18 MED ORDER — OMEPRAZOLE 40 MG PO CPDR: 40.0000 mg | DELAYED_RELEASE_CAPSULE | Freq: Every day | ORAL | 1 refills | Status: DC

## 2020-07-18 NOTE — Assessment & Plan Note (Signed)
Under good control on current regimen. Continue current regimen. Continue to monitor. Call with any concerns. Refills given.   

## 2020-07-18 NOTE — Progress Notes (Signed)
BP 114/70   Pulse 85   Temp 98.1 F (36.7 C) (Oral)   Wt 194 lb 12.8 oz (88.4 kg)   LMP 07/10/2020 (Exact Date)   SpO2 98%   BMI 30.51 kg/m    Subjective:    Patient ID: Ashley Kidd, female    DOB: Sep 16, 1981, 39 y.o.   MRN: [id number]  HPI: Ashley Kidd is a 39 y.o. female  Chief Complaint  Patient presents with  . Diabetes   DIABETES Hypoglycemic episodes:no Polydipsia/polyuria: no Visual disturbance: no Chest pain: no Paresthesias: no Glucose Monitoring: no  Accucheck frequency: Not Checking Taking Insulin?: no Blood Pressure Monitoring: not checking Retinal Examination: Not up to Date Foot Exam: Up to Date Diabetic Education: Completed Pneumovax: Up to Date Influenza: Up to Date Aspirin: no  ASTHMA Asthma status: controlled Satisfied with current treatment?: yes Albuterol/rescue inhaler frequency: never Dyspnea frequency: never Wheezing frequency:never Cough frequency: never Nocturnal symptom frequency: never  Limitation of activity: no Current upper respiratory symptoms: no Aerochamber/spacer use: yes Visits to ER or Urgent Care in past year: no Pneumovax: Up to Date Influenza: Up to Date   Relevant past medical, surgical, family and social history reviewed and updated as indicated. Interim medical history since our last visit reviewed. Allergies and medications reviewed and updated.  Review of Systems  Constitutional: Negative.   Respiratory: Negative.   Cardiovascular: Negative.   Gastrointestinal: Negative.   Musculoskeletal: Negative.   Neurological: Negative.   Psychiatric/Behavioral: Positive for dysphoric mood and sleep disturbance. Negative for agitation, behavioral problems, confusion, decreased concentration, hallucinations, self-injury and suicidal ideas. The patient is nervous/anxious. The patient is not hyperactive.     Per HPI unless specifically indicated above     Objective:    BP 114/70   Pulse 85   Temp 98.1 F  (36.7 C) (Oral)   Wt 194 lb 12.8 oz (88.4 kg)   LMP 07/10/2020 (Exact Date)   SpO2 98%   BMI 30.51 kg/m   Wt Readings from Last 3 Encounters:  07/18/20 194 lb 12.8 oz (88.4 kg)  01/16/20 200 lb 2 oz (90.8 kg)  09/18/19 209 lb 6.4 oz (95 kg)    Physical Exam Vitals and nursing note reviewed.  Constitutional:      General: She is not in acute distress.    Appearance: Normal appearance. She is not ill-appearing, toxic-appearing or diaphoretic.  HENT:     Head: Normocephalic and atraumatic.     Right Ear: External ear normal.     Left Ear: External ear normal.     Nose: Nose normal.     Mouth/Throat:     Mouth: Mucous membranes are moist.     Pharynx: Oropharynx is clear.  Eyes:     General: No scleral icterus.       Right eye: No discharge.        Left eye: No discharge.     Extraocular Movements: Extraocular movements intact.     Conjunctiva/sclera: Conjunctivae normal.     Pupils: Pupils are equal, round, and reactive to light.  Cardiovascular:     Rate and Rhythm: Normal rate and regular rhythm.     Pulses: Normal pulses.     Heart sounds: Normal heart sounds. No murmur heard.  No friction rub. No gallop.   Pulmonary:     Effort: Pulmonary effort is normal. No respiratory distress.     Breath sounds: Normal breath sounds. No stridor. No wheezing, rhonchi or rales.  Chest:  Chest wall: No tenderness.  Musculoskeletal:        General: Normal range of motion.     Cervical back: Normal range of motion and neck supple.  Skin:    General: Skin is warm and dry.     Capillary Refill: Capillary refill takes less than 2 seconds.     Coloration: Skin is not jaundiced or pale.     Findings: No bruising, erythema, lesion or rash.  Neurological:     General: No focal deficit present.     Mental Status: She is alert and oriented to person, place, and time. Mental status is at baseline.  Psychiatric:        Mood and Affect: Mood normal.        Behavior: Behavior normal.          Thought Content: Thought content normal.        Judgment: Judgment normal.     Results for orders placed or performed in visit on 01/16/20  Bayer DCA Hb A1c Waived  Result Value Ref Range   HB A1C (BAYER DCA - WAIVED) 6.9 <7.0 %  Comprehensive metabolic panel  Result Value Ref Range   Glucose 151 (H) 65 - 99 mg/dL   BUN 7 6 - 20 mg/dL   Creatinine, Ser 0.70 0.57 - 1.00 mg/dL   GFR calc non Af Amer 110 >59 mL/min/1.73   GFR calc Af Amer 127 >59 mL/min/1.73   BUN/Creatinine Ratio 10 9 - 23   Sodium 138 134 - 144 mmol/L   Potassium 4.9 3.5 - 5.2 mmol/L   Chloride 102 96 - 106 mmol/L   CO2 19 (L) 20 - 29 mmol/L   Calcium 9.5 8.7 - 10.2 mg/dL   Total Protein 6.8 6.0 - 8.5 g/dL   Albumin 4.1 3.8 - 4.8 g/dL   Globulin, Total 2.7 1.5 - 4.5 g/dL   Albumin/Globulin Ratio 1.5 1.2 - 2.2   Bilirubin Total 0.2 0.0 - 1.2 mg/dL   Alkaline Phosphatase 168 (H) 39 - 117 IU/L   AST 34 0 - 40 IU/L   ALT 21 0 - 32 IU/L  Lipid Panel w/o Chol/HDL Ratio  Result Value Ref Range   Cholesterol, Total 243 (H) 100 - 199 mg/dL   Triglycerides 264 (H) 0 - 149 mg/dL   HDL 42 >39 mg/dL   VLDL Cholesterol Cal 49 (H) 5 - 40 mg/dL   LDL Chol Calc (NIH) 152 (H) 0 - 99 mg/dL  Microalbumin, Urine Waived  Result Value Ref Range   Microalb, Ur Waived 10 0 - 19 mg/L   Creatinine, Urine Waived 50 10 - 300 mg/dL   Microalb/Creat Ratio 30-300 (H) <30 mg/g      Assessment & Plan:   Problem List Items Addressed This Visit      Respiratory   Moderate persistent asthma without complication    Under good control on current regimen. Continue current regimen. Continue to monitor. Call with any concerns. Refills given.        Relevant Medications   umeclidinium bromide (INCRUSE ELLIPTA) 62.5 MCG/INH AEPB   montelukast (SINGULAIR) 10 MG tablet   albuterol (VENTOLIN HFA) 108 (90 Base) MCG/ACT inhaler     Endocrine   Type 2 diabetes mellitus without complication, without long-term current use of insulin  ([place]) - Primary    Doing great with A1c of 6.3. Continue current regimen. Continue to monitor. Call with any concerns.       Relevant Orders   [facility]  Hb A1c Waived   Comprehensive metabolic panel   Lipid Panel w/o Chol/HDL Ratio    Other Visit Diagnoses    Encounter for hepatitis C screening test for low risk patient       Labs drawn today. Await results.    Relevant Orders   Hepatitis C Antibody   Needs flu shot       Flu shot given today.   Relevant Orders   Flu Vaccine QUAD 6+ mos PF IM (Fluarix Quad PF) (Completed)       Follow up plan: Return in about 6 months (around 01/16/2021) for physical.

## 2020-07-18 NOTE — Patient Instructions (Addendum)
Influenza (Flu) Vaccine (Inactivated or Recombinant): What You Need to Know 1. Why get vaccinated? Influenza vaccine can prevent influenza (flu). Flu is a contagious disease that spreads around the [place] every year, usually between October and May. Anyone can get the flu, but it is more dangerous for some people. Infants and young children, people 39 years of age and older, pregnant women, and people with certain health conditions or a weakened immune system are at greatest risk of flu complications. Pneumonia, bronchitis, sinus infections and ear infections are examples of flu-related complications. If you have a medical condition, such as heart disease, cancer or diabetes, flu can make it worse. Flu can cause fever and chills, sore throat, muscle aches, fatigue, cough, headache, and runny or stuffy nose. Some people may have vomiting and diarrhea, though this is more common in children than adults. Each year thousands of people in the [place] die from flu, and many more are hospitalized. Flu vaccine prevents millions of illnesses and flu-related visits to the doctor each year. 2. Influenza vaccine CDC recommends everyone 57 months of age and older get vaccinated every flu season. Children 6 months through 2 years of age may need 2 doses during a single flu season. Everyone else needs only 1 dose each flu season. It takes about 2 weeks for protection to develop after vaccination. There are many flu viruses, and they are always changing. Each year a new flu vaccine is made to protect against three or four viruses that are likely to cause disease in the upcoming flu season. Even when the vaccine doesn't exactly match these viruses, it may still provide some protection. Influenza vaccine does not cause flu. Influenza vaccine may be given at the same time as other vaccines. 3. Talk with your health care provider Tell your vaccine provider if the person getting the vaccine:  Has had an  allergic reaction after a previous dose of influenza vaccine, or has any severe, life-threatening allergies.  Has ever had Guillain-Barr Syndrome (also called GBS). In some cases, your health care provider may decide to postpone influenza vaccination to a future visit. People with minor illnesses, such as a cold, may be vaccinated. People who are moderately or severely ill should usually wait until they recover before getting influenza vaccine. Your health care provider can give you more information. 4. Risks of a vaccine reaction  Soreness, redness, and swelling where shot is given, fever, muscle aches, and headache can happen after influenza vaccine.  There may be a very small increased risk of Guillain-Barr Syndrome (GBS) after inactivated influenza vaccine (the flu shot). Young children who get the flu shot along with pneumococcal vaccine (PCV13), and/or DTaP vaccine at the same time might be slightly more likely to have a seizure caused by fever. Tell your health care provider if a child who is getting flu vaccine has ever had a seizure. People sometimes faint after medical procedures, including vaccination. Tell your provider if you feel dizzy or have vision changes or ringing in the ears. As with any medicine, there is a very remote chance of a vaccine causing a severe allergic reaction, other serious injury, or death. 5. What if there is a serious problem? An allergic reaction could occur after the vaccinated person leaves the clinic. If you see signs of a severe allergic reaction (hives, swelling of the face and throat, difficulty breathing, a fast heartbeat, dizziness, or weakness), call 9-1-1 and get the person to the nearest hospital. For other signs that  concern you, call your health care provider. Adverse reactions should be reported to the Vaccine Adverse Event Reporting System (VAERS). Your health care provider will usually file this report, or you can do it yourself. Visit the  VAERS website at www.vaers.SamedayNews.es or call 2031354773.VAERS is only for reporting reactions, and VAERS staff do not give medical advice. 6. The National Vaccine Injury Compensation Program The [employer] Vaccine Injury Compensation Program (VICP) is a federal program that was created to compensate people who may have been injured by certain vaccines. Visit the VICP website at GoldCloset.com.ee or call 619-796-3339 to learn about the program and about filing a claim. There is a time limit to file a claim for compensation. 7. How can I learn more?  Ask your healthcare provider.  Call your local or state health department.  Contact the Centers for Disease Control and Prevention (CDC): ? Call 323 635 9184 (1-800-CDC-INFO) or ? Visit CDC's https://gibson.com/ Vaccine Information Statement (Interim) Inactivated Influenza Vaccine (05/12/2018) This information is not intended to replace advice given to you by your health care provider. Make sure you discuss any questions you have with your health care provider. Document Revised: 01/03/2019 Document Reviewed: 05/16/2018 Elsevier Patient Education  [place] look at our top 10 online therapy picks for 2021 Best overall: [facility] Resources of licensed counselors: Technical brewer therapy for cognitive behavioral therapy (CBT): Online-Therapy.com Best online therapy for mental and physical health: [facility] Best for online psychiatry: [facility]DL[facility] Best online therapy for your budget: 7 Cups Best online therapy for couples: [facility] Best online therapy for teens: Teen Arts administrator therapy for LGBTQ: [facility] Best online therapy for single video sessions: [facility] If therapy on your time and in your own space sounds appealing, you're not alone.  While not the right solution for everyone, online therapy (aka telehealth) is quickly becoming one of the top ways people seek mental health  services.  Whether you're new to therapy or curious about how online counseling works, you'll want to take some time to research what's available. With that in mind, here are our 10 recommended picks for online therapy.  What is online therapy? Online therapy is therapy done remotely through a video conferencing platform, chat room, email, or over the phone. It has sparked in popularity ever since the COVID-19 pandemic forced therapists to expand their offerings in order to keep everyone safe.  If you're worried about the quality of your session, research shows that online therapy can be just as effective as therapy done in person. Many people prefer online therapy because it offers more privacy, can be more accessible, and is often cheaper.  How we choose We considered many criteria when selecting the best online therapy platforms, including:  all mental health professionals are licensed psychotherapists, psychologists, or psychiatrists a simple sign-up process positive customer feedback flexible payment options and fees a range of subscription options and services some accepted insurance therapists had a wide range of expertise, including anxiety, depression, trauma, relationship issues, substance misuse, grief, and eating disorders ADVERTISING   Healthline's picks of the top 10 online therapy services of 2021 Best overall [facility] With over 2,229 licensed therapists and multiple subscription plans, Elinor Dodge takes the spot for best overall online therapy.  After signing up, you'll complete an assessment and choose your payment plan. Then a consultation therapist will match you with several therapists and you'll choose the one that fits your needs. You'll begin working with them within a few days.  Therapists treat a variety of conditions or concerns, including:  depression anxiety addiction eating disorders post-traumatic stress disorder (PTSD) relationship issues In addition  to individual counseling, they also offer specific services for couples and teens. Plus, [facility] offers personalized psychiatric treatment and prescription management from a licensed prescriber.  Price: Elinor Dodge has several subscription plans available, with prices ranging from $65 to $99 per week. These plans include text, video, audio messaging, and live sessions. Some subscribers express frustration about having to sign up for an entire month of services, which means you'll still be billed for the month if you cancel early.  Coverage: If you have an employee assistance program (EAP) with behavioral health benefits, you may be eligible for coverage. Check with your employer.  Pros You have constant access to your therapist. Dennis Bast may find texting to be more comfortable than in-person sessions. [facility] offers discounts for paying biannually or quarterly. Cons Payments aren't on a sliding scale. The service isn't covered by many insurance plans. Time zone differences with your therapists can be a hindrance to fast communication.  Largest network of licensed counselors [facility] has access to over [phone],657 licensed, accredited, and experienced counselors. The company excels at [place] therapists to your individual needs and preferences.  [facility]'s licensed psychologists, clinical social workers, and marriage and family therapists specialize in areas like:  anxiety depression relationships parenting addiction grief eating disorders life transitions religion [facility] also offers individual, couples, and family counseling. Sessions with your therapist take place via video conferencing, exchanging messages, chatting live, and speaking over the phone. Users rave about the live sessions, with many saying it's the reason they chose [facility].  After completing a questionnaire and setting up your account, a computer program will match you with a therapist for  your specific needs. Typically, this takes about 24 hours, and you always have the option of requesting a different counselor.  Price: Prices range from $40 to $70 per week (billed monthly).  Coverage: [facility] doesn't accept insurance, so you'll pay out-of-pocket for counseling services. Financial assistance is available for those who qualify, and you can cancel your membership at any time.  Pros Website and mobile app are easy to navigate. You can choose between messaging, live chatting, speaking on the phone, or video conferencing with your therapist. Financial aid is available. Cons Therapists can't diagnose conditions or prescribe medication. The service isn't covered by most insurance companies. The service isn't meant for crisis situations.  Best online therapy for cognitive behavioral therapy (CBT) Online-Therapy.com Online-Therapy.com bases their entire operation on CBT, which is a top pick when treating a variety of mental health conditions, including:  depression anxiety alcohol and drug issues eating disorders phobias CBT helps you identify, challenge, and overcome negative or unhelpful thinking and develop behavior modifications that help you think and interact more positively.  Online-Therapy.com uses a toolbox of resources, including worksheets, an Building control surveyor, a 30-minute weekly live chat with your therapist, and messaging. Some users express concern about therapists only being available Monday through Friday for 8 hours a day, compared with other sites that offer 24-7 support.  Price: Prices range from about $32 to $64 per week, depending on the plan you choose.  Coverage: They don't accept insurance, but new subscribers get 20 percent off of their first month fees.  Pros You receive an online course in CBT. The app and website are user friendly. You're given worksheets that include daily feedback from your own personal therapist. Cons You'll likely have to  pay out of pocket. They don't offer 24-7 support options. The company doesn't have a Secondary school teacher profile.  Best online therapy for mental and physical health [facility] If you're looking for an online telemedicine platform that offers care for physical and psychological health, consider [facility].  With online doctor or therapist visits available 24-7, [facility] is a great site for one-stop shopping. Here's how it works: Social worker an account, choose the doctor or therapist that works best for you, then schedule a visit via their web-based or mobile app program.  [facility] provides counseling for:  anxiety depression PTSD or trauma life transitions couples therapy Price: The cost of a psychologist or counselor visit varies from $99 to $110 based on the therapist (either master's degree or doctorate in their field) and lasts about 45 minutes. They also offer online psychiatrists who can prescribe medications.  Coverage: If your health insurance plan provides coverage for mental health services, your out-of-pocket costs may be lower. [facility] doesn't provide a subscription-based service. If you need to talk with a medical doctor for other health conditions, the visit costs $79.  Pros You're able to choose between a therapist or a psychotherapist depending on what you need. [facility] offers more than just therapy. They also have doctors available to treat other health conditions. Your visit may be covered by insurance. Cons They don't have a subscription option. Some reviewers say they have encountered technical difficulties. [facility] offers fewer communication options, compared with other services.  Best for online psychiatry [facility]DL[facility] MDLive, a comprehensive telemedicine platform, has a division specifically for psychiatry services that offers both counseling and prescription management with a board certified psychiatrist.  [facility] can help with:  anxiety addiction depression bipolar  disorder PTSD and trauma panic disorders grief and loss It takes about 15 minutes to set up an account with [facility]. Once registered, you can search through their network of psychiatrists and choose the one that's right for you. When you're ready to schedule an appointment, you can choose between secure online video, phone, or the [facility] app.  Price: The cost to see a psychiatrist is quite a bit higher than a counselor. You can expect to pay $284 for your first visit and $108 for each follow-up appointment. They also offer counseling sessions with a licensed therapist at a lower cost than a visit with a psychiatrist, which is a nice feature if you need to transition from a psychiatrist to ongoing sessions with a therapist. [facility]DLive doesn't offer a subscription-based service.  Coverage: If your health insurance plan provides coverage for mental health services, your out-of-pocket costs may be lower.  Pros 24-7 unlimited access to doctor. Members can save up to 43 percent on prescription medications. Some employers include MDLive as part of their group benefits. Cons MDLive doesn't offer subscription plans. The price of seeing a psychiatrist is higher than other similar services. They don't offer email or chat support options.  Best online therapy for your budget [facility] Affordable online therapy, free 24-7 emotional support, and chat rooms with people who understand what you're going through make [facility] Cups one of the largest emotional support systems.  Specialties and areas of expertise include:  addiction anxiety bipolar disorder depression family grief parenting substance misuse trauma Price: Online therapy and counseling with licensed therapists are $150 per month -- significantly less than other online therapy platforms. Plus, [facility] Cups offers emotional support and access to speak to a trained volunteer (not a Landscape architect) at no charge. This option is  appropriate for support and to  help connect you with services.  If you need more in-depth treatment, opt for the paid subscription plan with a licensed therapist. After creating an account, you'll choose the free version or paid subscription. If you go with the trained volunteer option, which is free, you'll have access immediately. The subscription option requires more information to help match you with the right therapist.  Coverage: Services offered using this site are generally not covered by health insurance.  Pros The freemium model is well-liked by users. You can speak with a trained volunteer at no charge. The service offers a free group chat between users. Cons The app has been reported to have a lot of bugs. Some reviewers say some of the inexperienced active listeners don't pay attention and are judgmental or inappropriate. Unless you pay additional, you're not getting professional help.  Best online therapy for couples [facility] When you need couples counseling, you want a licensed therapist that's trained in dealing with relationship issues, which is why [facility] is the top pick for best online therapy for couples.  All therapists are licensed and include accredited psychologists, licensed marriage and family therapists, licensed clinical social workers, or Educational psychologist.  After completing a questionnaire, the automated system will match you with a therapist. Two users share an account and participate in live sessions with the therapist together. If one person needs to speak privately to the therapist, an individual session is scheduled. They don't support three-way live sessions, so both partners need to be together in the same room to communicate with the therapist in real-time.  Price: Prices range from $40 to $70 per week, and this includes both partners.  Coverage: Services offered using this site are generally not covered by health insurance.  Pros All of [facility]'s counselors specialize  or have an interest in relationship counseling. One or both partners can participate in counseling at no extra cost. The service offers a 1-week free trial when you begin. Cons The service doesn't support three-way live sessions, so you and your partner need to be in the same place. [facility]ain isn't suitable for court-ordered counseling. It's unclear whether Re[facility] works with nonmonogamous couples.  Best online therapy for teens [facility] Teen Counseling, an online platform just for teens ages 75 to 48, offers live chats, phone calls, Administrator, and messaging in a "private" counseling room with a licensed therapist.  Some of the issues therapists can address during counseling sessions include:  anxiety stress depression bullying eating disorders Although parents don't have access to this room, a therapist must report abuse and if the teen is a danger to themselves or others. Parents can complete the registration process, which includes being matched with a Landscape architect. Your teen will receive a code inviting them to join their private room.  Price: Prices range from $40 to $70 per week.  Coverage: Services offered using this site are generally not covered by health insurance.  Pros All sessions are confidential. Counselors are specialized in dealing with teen issues. The "rooms" are open 24-7. Cons The service isn't suitable for court-ordered counseling. Teen Counseling isn't suitable for emergency situations. Counselors can't provide a diagnosis.  Best online therapy for [employer], therapists recognize that the [place] deals with mental health issues at a disproportionately higher rate and want to make help accessible for everyone.  After you sign up, you'll be matched with a counselor who fits your objectives, preferences, and the  type of issues you're dealing with. All their counselors specialize in the [place], but different counselors have different approaches and areas of focus.  Areas of expertise include:  anxiety stress depression trauma family conflicts relationships eating disorders All therapists are licensed and include accredited psychologists, licensed marriage and family therapists, licensed clinical social workers, or Educational psychologist. Counseling sessions take place with your therapist via video conferencing, phone calls, chatting live, and exchanging messages.  Price: Prices range from $40 to $70 per week.  Coverage: Services offered using this site are generally not covered by health insurance.  Pros All counselors have experience and interest in helping the [place]. The written messages between you and your counselor are available for you to re-read. The app and website are easy to navigate. Cons The service isn't suitable for minors. Your counselor will not be able to provide a diagnosis or prescribe medication. The service isn't suitable for emergency situations.  Best online therapy for single video sessions [facility] Not sure you want to commit to a subscription plan? No problem. [facility] offers single video chat sessions with trained mental health professionals who are licensed psychiatrists and therapists.  After an initial assessment, you can browse their selection of psychiatrists and therapists and book an appointment.  Areas of expertise include:  anxiety depression bipolar disorder trauma postpartum depression anger management If medication is part of your therapy, a psychiatrist can order electronic prescriptions to the pharmacy of your choice. They also have appointments available with medical doctors for other physical conditions.  Price:  Psychology: [phone] for a 25-minute consultation; $179 for 50 minutes Psychiatry: $299 for initial 45-minute consultation; $128 for 15-minute follow-up Coverage:  [facility] accepts insurance. Check with your insurance company to see if your plan is approved.  Pros Licensed providers have an average of 15-plus years of experience. Psychiatrists can order prescriptions to your pharmacy of choice. No subscription is necessary. Cons Some users report technical issues. No email or chat support options are available. Fewer therapists are available compared with other online services.  How to afford therapy  There's no doubt that therapy is expensive. The average out-of-pocket cost for a session can range from $75 to $150. Fortunately, online counseling -- in many cases -- is more affordable.  If your insurance covers mental health services, start by contacting them to see if the service you're interested in is part of their network. You'll also want to find out if the online platform you're using takes insurance. Many of them do not, so it's worth looking at a few different sites to see if one fits your needs and takes insurance.  Some top online therapy programs offer different subscription plans to make counseling more affordable. Some private therapists who provide online counseling offer a sliding scale, with some willing to lower costs as much as 50 percent.  The other place to check is with your EAP. Many employers offer mental health benefits as part of their EAP package.  Finally, community-based behavioral health clinics offer services for free or at a low cost to anyone who qualifies.  Benefits of online therapy Like in-person therapy, online therapy is a counseling session with a licensed therapist or psychiatrist, but instead of meeting in an office, your sessions take place at home.  How your therapist leads a meeting is up to you, with the most common forms of delivery being live videos, phone calls, and messaging. And the best part?  Many professionals are available morning, afternoon, or night, and on weekends, making therapy more  accessible than ever.  For some people, this method of counseling may take some time to get used to. But for others, virtual visits will be the reason they start and stick with therapy.  If you already have a therapist that offers online counseling, you're set. But if your counselor doesn't offer virtual sessions or you're new to therapy, there are plenty of online services and platforms available.  Not all mental health conditions are suitable for online services Although licensed therapists can treat many conditions online, there are times when an in-person visit is more appropriate. Severe mental health conditions that require a treatment team may not be suitable for online services. This includes suicidal thoughts and harm to others.  If you're thinking about harming yourself or others, or you know someone who is, call the [place] at [phone], call 911, or seek emergency medical attention.  Frequently asked questions Is online therapy effective? While text therapy can be helpful, it's not a perfect fit for everyone. Some people miss face-to-face contact, while others feel more comfortable outside of the house.  If you're in an emergency situation, text therapy isn't for you.  Remember that online therapy is better than no therapy. With no commute times, affordable pricing structures, and 24-7 chat rooms it's never been easier to get the help you need.  Does insurance cover online therapy? Because of the pandemic, more insurance companies have begun to cover online therapy. The exact coverage for online therapy available to you depends on your insurance plan, the therapist you choose, and the regulations of your state.  Medicare is now covering online therapy for all Medicare members. Medicare Advantage Plans also cover online therapy. When it comes to [facility], your coverage depends on your state. You can call the number on your Medicaid card for more  information.  Are online therapists licensed professionals? A mental health professional's training depends on their specific field as well as the state they practice in. Psychologists often require a PhD, while psychiatrists require a MD. Almost all the sites listed use professionals that are licensed to provide video therapy to clients within the state in which they hold their license. Make sure to check which type of mental health professional you're matched with before signing up.   Takeaway  Online therapy is making mental health services accessible and more affordable for many people.  With the ability to see a counselor at your convenience and in your own  home, virtual visits may become the new norm, at least for a while.

## 2020-07-18 NOTE — Assessment & Plan Note (Signed)
Doing great with A1c of 6.3. Continue current regimen. Continue to monitor. Call with any concerns.  

## 2020-07-19 LAB — COMPREHENSIVE METABOLIC PANEL
ALT: 11 IU/L (ref 0–32)
AST: 9 IU/L (ref 0–40)
Albumin/Globulin Ratio: 1.6 (ref 1.2–2.2)
Albumin: 4.4 g/dL (ref 3.8–4.8)
Alkaline Phosphatase: 134 IU/L — ABNORMAL HIGH (ref 44–121)
BUN/Creatinine Ratio: 9 (ref 9–23)
BUN: 6 mg/dL (ref 6–20)
Bilirubin Total: 0.3 mg/dL (ref 0.0–1.2)
CO2: 21 mmol/L (ref 20–29)
Calcium: 9.3 mg/dL (ref 8.7–10.2)
Chloride: 99 mmol/L (ref 96–106)
Creatinine, Ser: 0.68 mg/dL (ref 0.57–1.00)
GFR calc Af Amer: 127 mL/min/{1.73_m2} (ref >59)
GFR calc non Af Amer: 111 mL/min/{1.73_m2} (ref >59)
Globulin, Total: 2.7 g/dL (ref 1.5–4.5)
Glucose: 108 mg/dL — ABNORMAL HIGH (ref 65–99)
Potassium: 4.4 mmol/L (ref 3.5–5.2)
Sodium: 137 mmol/L (ref 134–144)
Total Protein: 7.1 g/dL (ref 6.0–8.5)

## 2020-07-19 LAB — LIPID PANEL W/O CHOL/HDL RATIO
Cholesterol, Total: 268 mg/dL — ABNORMAL HIGH (ref 100–199)
HDL: 45 mg/dL (ref >39)
LDL Chol Calc (NIH): 166 mg/dL — ABNORMAL HIGH (ref 0–99)
Triglycerides: 300 mg/dL — ABNORMAL HIGH (ref 0–149)
VLDL Cholesterol Cal: 57 mg/dL — ABNORMAL HIGH (ref 5–40)

## 2020-07-19 LAB — HEPATITIS C ANTIBODY: Hep C Virus Ab: <0.1 s/co ratio (ref 0.0–0.9)

## 2020-08-01 ENCOUNTER — Telehealth: Payer: Managed Care, Other (non HMO) | Admitting: Family

## 2020-08-01 DIAGNOSIS — J0100 Acute maxillary sinusitis, unspecified: Secondary | ICD-10-CM

## 2020-08-01 MED ORDER — AMOXICILLIN-POT CLAVULANATE 875-125 MG PO TABS: 1.0000 | ORAL_TABLET | Freq: Two times a day (BID) | ORAL | 0 refills | Status: AC

## 2020-08-01 NOTE — Progress Notes (Signed)
We are sorry that you are not feeling well.  Here is how we plan to help!  Based on what you have shared with me it looks like you have sinusitis.  Sinusitis is inflammation and infection in the sinus cavities of the head.  Based on your presentation I believe you most likely have Acute Bacterial Sinusitis.  This is an infection caused by bacteria and is treated with antibiotics. I have prescribed Augmentin 875mg /125mg  one tablet twice daily with food, for 10 days. You may use an oral decongestant such as Mucinex D or if you have glaucoma or high blood pressure use plain Mucinex. Saline nasal spray help and can safely be used as often as needed for congestion.  If you develop worsening sinus pain, fever or notice severe headache and vision changes, or if symptoms are not better after completion of antibiotic, please schedule an appointment with a health care provider.    Sinus infections are not as easily transmitted as other respiratory infection, however we still recommend that you avoid close contact with loved ones, especially the very young and elderly.  Remember to wash your hands thoroughly throughout the day as this is the number one way to prevent the spread of infection!  Home Care:  Only take medications as instructed by your medical team.  Complete the entire course of an antibiotic.  Do not take these medications with alcohol.  A steam or ultrasonic humidifier can help congestion.  You can place a towel over your head and breathe in the steam from hot water coming from a faucet.  Avoid close contacts especially the very young and the elderly.  Cover your mouth when you cough or sneeze.  Always remember to wash your hands.  Get Help Right Away If:  You develop worsening fever or sinus pain.  You develop a severe head ache or visual changes.  Your symptoms persist after you have completed your treatment plan.  Make sure you  Understand these instructions.  Will watch your  condition.  Will get help right away if you are not doing well or get worse.  Your e-visit answers were reviewed by a board certified advanced clinical practitioner to complete your personal care plan.  Depending on the condition, your plan could have included both over the counter or prescription medications.  If there is a problem please reply  once you have received a response from your provider.  Your safety is important to [place].  If you have drug allergies check your prescription carefully.    You can use MyChart to ask questions about today's visit, request a non-urgent call back, or ask for a work or school excuse for 24 hours related to this e-Visit. If it has been greater than 24 hours you will need to follow up with your provider, or enter a new e-Visit to address those concerns.  You will get an e-mail in the next two days asking about your experience.  I hope that your e-visit has been valuable and will speed your recovery. Thank you for using e-visits.  Greater than 5 minutes, yet less than 10 minutes of time have been spent researching, coordinating, and implementing care for this patient.

## 2020-10-15 ENCOUNTER — Other Ambulatory Visit: Payer: Self-pay

## 2020-10-15 ENCOUNTER — Ambulatory Visit
Admission: EM | Admit: 2020-10-15 | Discharge: 2020-10-15 | Disposition: A | Discharge status: Home / Self Care | Payer: Managed Care, Other (non HMO) | Attending: Family Medicine | Admitting: Family Medicine

## 2020-10-15 ENCOUNTER — Encounter: Payer: Self-pay | Admitting: Emergency Medicine

## 2020-10-15 DIAGNOSIS — Z20822 Contact with and (suspected) exposure to covid-19: Secondary | ICD-10-CM | POA: Diagnosis present

## 2020-10-15 DIAGNOSIS — B349 Viral infection, unspecified: Secondary | ICD-10-CM | POA: Insufficient documentation

## 2020-10-15 DIAGNOSIS — U071 COVID-19: Secondary | ICD-10-CM | POA: Diagnosis not present

## 2020-10-15 MED ORDER — KETOROLAC TROMETHAMINE 10 MG PO TABS: 10.0000 mg | ORAL_TABLET | Freq: Four times a day (QID) | ORAL | 0 refills | Status: DC | PRN

## 2020-10-15 MED ORDER — IPRATROPIUM BROMIDE 0.06 % NA SOLN: 2.0000 | Freq: Four times a day (QID) | NASAL | 0 refills | Status: DC | PRN

## 2020-10-15 NOTE — Discharge Instructions (Signed)
Medication as prescribed.  Stay home.  Check my chart for COVID test results.  Take care  Dr. Elverta Dimiceli   

## 2020-10-15 NOTE — ED Provider Notes (Signed)
[facility]    CSN: [id number] Arrival date & time: 10/15/20  1325      History   Chief Complaint Chief Complaint  Patient presents with  . Covid Exposure  . Sinus Problem  . Cough  . Sore Throat   HPI  40 year old female presents with above complaints.  Patient's husband has COVID. Patient has had symptoms x 3 days.  Patient reports sore throat, ear pain, congestion, fever, cough.  She is most bothered by her ear pain.  She has been taking over-the-counter Mucinex and Vicks sinus without relief.  She is currently afebrile.  Patient has her children with her today and she wants him to get tested as well.  They are asymptomatic.  No other reported symptoms.  No other complaints.  Past Medical History:  Diagnosis Date  . Allergy   . Asthma   . GERD (gastroesophageal reflux disease)   . Migraine   . PCOS (polycystic ovarian syndrome)   . PCOS (polycystic ovarian syndrome)   . Sinus complaint   . Type 2 diabetes mellitus without complication, without long-term current use of insulin ([place]) 07/26/2019    Patient Active Problem List   Diagnosis Date Noted  . Type 2 diabetes mellitus without complication, without long-term current use of insulin ([place]) 07/26/2019  . Moderate persistent asthma without complication 24/05/7352  . C. difficile diarrhea 12/24/2015  . Migraine   . Allergy   . Low back pain 03/26/2015  . Chronic cholecystitis without calculus 05/19/2013    Past Surgical History:  Procedure Laterality Date  . CHOLECYSTECTOMY  06-09-13  . OVARIAN CYST REMOVAL Bilateral 2002  . UPPER GI ENDOSCOPY  11-29-12   Dr Candace Cruise    OB History    Gravida  0   Para  0   Term  0   Preterm  0   AB  0   Living  0     SAB  0   IAB  0   Ectopic  0   Multiple  0   Live Births           Obstetric Comments  1st Menstrual Cycle: 13 1st Pregnancy:  NA         Home Medications    Prior to Admission medications   Medication Sig Start Date End Date  Taking? Authorizing Provider  albuterol (VENTOLIN HFA) 108 (90 Base) MCG/ACT inhaler INHALE 2 PUFFS INTO THE LUNGS EVERY 6 HOURS AS NEEDED FOR WHEEZING 07/18/20  Yes Johnson, Megan P, DO  aspirin 81 MG tablet Take 81 mg by mouth daily.   Yes [provider]  budesonide-formoterol (SYMBICORT) 160-4.5 MCG/ACT inhaler Inhale 2 puffs into the lungs 2 (two) times daily. 01/16/20  Yes Johnson, Megan P, DO  cetirizine (ZYRTEC) 10 MG tablet Take 10 mg by mouth daily.   Yes [provider]  citalopram (CELEXA) 20 MG tablet Take 20 mg by mouth daily.   Yes [provider]  EPINEPHrine 0.3 mg/0.3 mL IJ SOAJ injection INJ 0.3 ML IM ONCE FOR 1 DOSE 03/04/18  Yes [provider]  fluticasone (FLONASE) 50 MCG/ACT nasal spray INSTILL 2 SPRAYS IN EACH NOSTRIL EVERY DAY 01/16/20  Yes Johnson, Megan P, DO  ipratropium (ATROVENT) 0.06 % nasal spray Place 2 sprays into both nostrils 4 (four) times daily as needed for rhinitis. 10/15/20  Yes Chantae Soo G, DO  ketorolac (TORADOL) 10 MG tablet Take 1 tablet (10 mg total) by mouth every 6 (six) hours as needed  for moderate pain or severe pain. 10/15/20  Yes Layonna Dobie G, DO  montelukast (SINGULAIR) 10 MG tablet TAKE 1 TABLET(10 MG) BY MOUTH AT BEDTIME 07/18/20  Yes Johnson, Megan P, DO  omeprazole (PRILOSEC) 40 MG capsule Take 1 capsule (40 mg total) by mouth daily. 07/18/20  Yes Johnson, Megan P, DO  ondansetron (ZOFRAN) 4 MG tablet Take 1 tablet (4 mg total) by mouth every 6 (six) hours. 06/16/19  Yes Hedges, Dellis Filbert, PA-C  Probiotic Product (PROBIOTIC DAILY PO) Take 1 capsule by mouth daily.   Yes [provider]  SUMAtriptan (IMITREX) 100 MG tablet Take 1 tablet (100 mg total) by mouth every 2 (two) hours as needed for migraine. One tab at onset and may repeat x1 in 1 hour. Max 200 mg/24 hours 01/16/20  Yes Johnson, Megan P, DO  umeclidinium bromide (INCRUSE ELLIPTA) 62.5 MCG/INH AEPB Inhale 1 puff into the lungs daily. 07/18/20  Yes  Johnson, Megan P, DO  ZOVIA 1/35E, 28, 1-35 MG-MCG tablet Take by mouth daily. 05/03/16  Yes [provider]    Family History Family History  Problem Relation Age of Onset  . Diabetes Mother   . Asthma Mother   . Diabetes Father   . Heart attack Father   . Heart disease Father   . Asthma Sister   . Bipolar disorder Sister   . Asthma Brother   . Bipolar disorder Brother   . Epilepsy Brother     Social History Social History   Tobacco Use  . Smoking status: Former Smoker    Years: 12.00    Quit date: 08/24/2012    Years since quitting: 8.1  . Smokeless tobacco: Never Used  Vaping Use  . Vaping Use: Never used  Substance Use Topics  . Alcohol use: No    Alcohol/week: 0.0 standard drinks  . Drug use: No     Allergies   Hydrocodone, Tussionex pennkinetic er [hydrocod polst-cpm polst er], and Bee venom   Review of Systems Review of Systems  Constitutional: Positive for fever.  HENT: Positive for congestion, ear pain and sore throat.   Respiratory: Positive for cough.    Physical Exam Triage Vital Signs ED Triage Vitals  Enc Vitals Group     BP 10/15/20 1413 130/83     Pulse Rate 10/15/20 1413 (!) 107     Resp 10/15/20 1413 18     Temp 10/15/20 1413 98.4 F (36.9 C)     Temp Source 10/15/20 1413 Oral     SpO2 10/15/20 1413 100 %     Weight 10/15/20 1415 195 lb (88.5 kg)     Height 10/15/20 1415 5\' 7"  (1.702 m)     Head Circumference --      Peak Flow --      Pain Score 10/15/20 1414 4     Pain Loc --      Pain Edu? --      Excl. in [place]? --    Updated Vital Signs BP 130/83 (BP Location: Left Arm)   Pulse (!) 107   Temp 98.4 F (36.9 C) (Oral)   Resp 18   Ht 5\' 7"  (1.702 m)   Wt 88.5 kg   LMP 10/08/2020 (Approximate)   SpO2 100%   BMI 30.54 kg/m   Visual Acuity Right Eye Distance:   Left Eye Distance:   Bilateral Distance:    Right Eye Near:   Left Eye Near:    Bilateral Near:     Physical  Exam Vitals and nursing note reviewed.   Constitutional:      General: She is not in acute distress.    Appearance: Normal appearance. She is not ill-appearing.  HENT:     Head: Normocephalic and atraumatic.     Nose: Congestion present.  Cardiovascular:     Rate and Rhythm: Normal rate and regular rhythm.     Heart sounds: No murmur heard.   Pulmonary:     Effort: Pulmonary effort is normal.     Breath sounds: Normal breath sounds. No wheezing, rhonchi or rales.  Neurological:     Mental Status: She is alert.  Psychiatric:        Mood and Affect: Mood normal.        Behavior: Behavior normal.    UC Treatments / Results  Labs (all labs ordered are listed, but only abnormal results are displayed) Labs Reviewed  GROUP A STREP BY PCR  SARS CORONAVIRUS 2 (TAT 6-24 HRS)  CULTURE, GROUP A STREP [facility])    EKG   Radiology No results found.  Procedures Procedures (including critical care time)  Medications Ordered in UC Medications - No data to display  Initial Impression / Assessment and Plan / UC Course  I have reviewed the triage vital signs and the nursing notes.  Pertinent labs & imaging results that were available during my care of the patient were reviewed by me and considered in my medical decision making (see chart for details).    40 year old female presents with a viral illness.  Suspected COVID-19.  Awaiting test results.  Atrovent nasal spray for congestion.  Toradol for body aches.  Supportive care.  Final Clinical Impressions(s) / UC Diagnoses   Final diagnoses:  Viral illness  Suspected COVID-19 virus infection     Discharge Instructions     Medication as prescribed.  Stay home.  Check my chart for COVID test results.  Take care  Dr. Lacinda Axon      ED Prescriptions    Medication Sig Dispense Auth. Provider   ipratropium (ATROVENT) 0.06 % nasal spray Place 2 sprays into both nostrils 4 (four) times daily as needed for rhinitis. 15 mL Darika Ildefonso G, DO   ketorolac (TORADOL) 10 MG  tablet Take 1 tablet (10 mg total) by mouth every 6 (six) hours as needed for moderate pain or severe pain. 20 tablet Coral Spikes, DO     PDMP not reviewed this encounter.   Coral Spikes, [place] 10/15/20 1646

## 2020-10-15 NOTE — ED Triage Notes (Signed)
Patient in today c/o fever (100.8), cough, sinus congestion, ear pain and sore throat. Patient's husband has covid. Patient has taken OTC Mucinex and vick's sinus. Patient's last dose of fever reducer was last night.

## 2020-10-16 LAB — SARS CORONAVIRUS 2 (TAT 6-24 HRS): SARS Coronavirus 2: POSITIVE — AB

## 2020-10-18 LAB — CULTURE, GROUP A STREP (THRC)

## 2020-10-22 ENCOUNTER — Other Ambulatory Visit: Payer: Self-pay | Admitting: Family Medicine

## 2020-12-07 ENCOUNTER — Telehealth: Payer: Managed Care, Other (non HMO) | Admitting: Physician Assistant

## 2020-12-07 ENCOUNTER — Encounter: Payer: Self-pay | Admitting: Physician Assistant

## 2020-12-07 DIAGNOSIS — J018 Other acute sinusitis: Secondary | ICD-10-CM | POA: Diagnosis not present

## 2020-12-07 DIAGNOSIS — Z20822 Contact with and (suspected) exposure to covid-19: Secondary | ICD-10-CM | POA: Diagnosis not present

## 2020-12-07 MED ORDER — AMOXICILLIN-POT CLAVULANATE 875-125 MG PO TABS: 1.0000 | ORAL_TABLET | Freq: Two times a day (BID) | ORAL | 0 refills | Status: DC

## 2020-12-07 NOTE — Progress Notes (Signed)
E-Visit for Corona Virus Screening  Your current symptoms could be consistent with the coronavirus.  Many health care providers can now test patients at their office but not all are.  [facility] has multiple testing sites. For information on our [place] testing locations and hours go to [email or web address]  We are enrolling you in our [place] for [facility] . Daily you will receive a questionnaire within the [facility] website. Our COVID 19 response team will be monitoring your responses daily.  Testing Information: The COVID-19 Community Testing sites are testing BY APPOINTMENT ONLY.  You can schedule online at [email or web address]  If you do not have access to a smart phone or computer you may call [phone] for an appointment.   Additional testing sites in the Community:  . For [facility] Testing sites in [facility]  [email or web address]  . For Pop-up testing sites in [place]  [email or web address]  . For [facility] [email or web address]  . For [facility] testing in [place] and [employer] [email or web address]  . For [facility] testing in [facility]   [email or web address]  For  more information about community testing call [phone]   Please quarantine yourself while awaiting your test results. Please stay home for a minimum of 10 days from the first day of illness with improving symptoms and you have had 24 hours of no fever (without the use of Tylenol (Acetaminophen) Motrin (Ibuprofen) or any fever reducing medication).  Also - Do not get tested prior to returning to work because once you have had a positive test the test can stay  positive for more than a month in some cases.   You should wear a mask or cloth face covering over your nose and mouth if you must be around other people or animals, including pets (even at home). Try to stay at least 6 feet away from other people. This will protect the people around you.  Please continue good preventive care measures, including:  frequent hand-washing, avoid touching your face, cover coughs/sneezes, stay out of crowds and keep a 6 foot distance from others.  COVID-19 is a respiratory illness with symptoms that are similar to the flu. Symptoms are typically mild to moderate, but there have been cases of severe illness and death due to the virus.   The following symptoms may appear 2-14 days after exposure: . Fever . Cough . Shortness of breath or difficulty breathing . Chills . Repeated shaking with chills . Muscle pain . Headache . Sore throat . New loss of taste or smell . Fatigue . Congestion or runny nose . Nausea or vomiting . Diarrhea  Go to the nearest hospital ED for assessment if fever/cough/breathlessness are severe or illness seems like a threat to life.  It is vitally important that if you feel that you have an infection such as this virus or any other virus that you stay home and away from places where you may spread it to others.  You should avoid contact with people age 49 and older.      Based on what you have shared with me it looks like you have sinusitis.  Sinusitis is inflammation and infection in the sinus cavities of the head.  Based on your presentation I believe you most likely have Acute Bacterial Sinusitis.  This is an infection caused by bacteria and is treated with antibiotics. I have prescribed Augmentin 875mg /125mg  one tablet twice daily with food, for 7 days.    You may use an oral decongestant such  as Mucinex D or if you have glaucoma or high blood pressure use plain Mucinex. Saline nasal spray help and can safely be used as often as needed  for congestion.  If you develop worsening sinus pain, fever or notice severe headache and vision changes, or if symptoms are not better after completion of antibiotic, please schedule an appointment with a health care provider.    Sinus infections are not as easily transmitted as other respiratory infection, however we still recommend that you avoid close contact with loved ones, especially the very young and elderly.  Remember to wash your hands thoroughly throughout the day as this is the number one way to prevent the spread of infection!    You may also take acetaminophen (Tylenol) as needed for fever.  Reduce your risk of any infection by using the same precautions used for avoiding the common cold or flu:  Marland Kitchen Wash your hands often with soap and warm water for at least 20 seconds.  If soap and water are not readily available, use an alcohol-based hand sanitizer with at least 60% alcohol.  . If coughing or sneezing, cover your mouth and nose by coughing or sneezing into the elbow areas of your shirt or coat, into a tissue or into your sleeve (not your hands). . Avoid shaking hands with others and consider head nods or verbal greetings only. . Avoid touching your eyes, nose, or mouth with unwashed hands.  . Avoid close contact with people who are sick. . Avoid places or events with large numbers of people in one location, like concerts or sporting events. . Carefully consider travel plans you have or are making. . If you are planning any travel outside or inside the [place], visit the CDC's Travelers' Health webpage for the latest health notices. . If you have some symptoms but not all symptoms, continue to monitor at home and seek medical attention if your symptoms worsen. . If you are having a medical emergency, call 911.  HOME CARE . Only take medications as instructed by your medical team. . Drink plenty of fluids and get plenty of rest. . A steam or ultrasonic humidifier can help if you have  congestion.   GET HELP RIGHT AWAY IF YOU HAVE EMERGENCY WARNING SIGNS** FOR COVID-19. If you or someone is showing any of these signs seek emergency medical care immediately. Call 911 or proceed to your closest emergency facility if: . You develop worsening high fever. . Trouble breathing . Bluish lips or face . Persistent pain or pressure in the chest . New confusion . Inability to wake or stay awake . You cough up blood. . Your symptoms become more severe  **This list is not all possible symptoms. Contact your medical provider for any symptoms that are sever or concerning to you.  MAKE SURE YOU   Understand these instructions.  Will watch your condition.  Will get help right away if you are not doing well or get worse.  Your e-visit answers were reviewed by a board certified advanced clinical practitioner to complete your personal care plan.  Depending on the condition, your plan could have included both over the counter or prescription medications.  If there is a problem please reply once you have received a response from your provider.  Your safety is important to [place].  If you have drug allergies check your prescription carefully.    You can use MyChart to ask questions about today's visit, request a non-urgent call back, or ask  for a work or school excuse for 24 hours related to this e-Visit. If it has been greater than 24 hours you will need to follow up with your provider, or enter a new e-Visit to address those concerns. You will get an e-mail in the next two days asking about your experience.  I hope that your e-visit has been valuable and will speed your recovery. Thank you for using e-visits.    Home Care:  Only take medications as instructed by your medical team.  Complete the entire course of an antibiotic.  Do not take these medications with alcohol.  A steam or ultrasonic humidifier can help congestion.  You can place a towel over your head and breathe in the  steam from hot water coming from a faucet.  Avoid close contacts especially the very young and the elderly.  Cover your mouth when you cough or sneeze.  Always remember to wash your hands.  Get Help Right Away If:  You develop worsening fever or sinus pain.  You develop a severe head ache or visual changes.  Your symptoms persist after you have completed your treatment plan.  Make sure you  Understand these instructions.  Will watch your condition.  Will get help right away if you are not doing well or get worse.  Your e-visit answers were reviewed by a board certified advanced clinical practitioner to complete your personal care plan.  Depending on the condition, your plan could have included both over the counter or prescription medications.  If there is a problem please reply  once you have received a response from your provider.  Your safety is important to [place].  If you have drug allergies check your prescription carefully.    You can use MyChart to ask questions about today's visit, request a non-urgent call back, or ask for a work or school excuse for 24 hours related to this e-Visit. If it has been greater than 24 hours you will need to follow up with your provider, or enter a new e-Visit to address those concerns.  You will get an e-mail in the next two days asking about your experience.  I hope that your e-visit has been valuable and will speed your recovery. Thank you for using e-visits.   I spent 5-10 minutes on review and completion of this note- Lacy Duverney [facility]

## 2021-01-11 ENCOUNTER — Encounter: Payer: Self-pay | Admitting: Emergency Medicine

## 2021-01-11 ENCOUNTER — Other Ambulatory Visit: Payer: Self-pay

## 2021-01-11 ENCOUNTER — Ambulatory Visit
Admission: EM | Admit: 2021-01-11 | Discharge: 2021-01-11 | Disposition: A | Discharge status: Home / Self Care | Payer: Managed Care, Other (non HMO) | Attending: Emergency Medicine | Admitting: Emergency Medicine

## 2021-01-11 DIAGNOSIS — J329 Chronic sinusitis, unspecified: Secondary | ICD-10-CM

## 2021-01-11 DIAGNOSIS — J40 Bronchitis, not specified as acute or chronic: Secondary | ICD-10-CM | POA: Diagnosis not present

## 2021-01-11 MED ORDER — PREDNISONE 10 MG (21) PO TBPK: ORAL_TABLET | ORAL | 0 refills | Status: DC

## 2021-01-11 MED ORDER — CEFDINIR 300 MG PO CAPS: 300.0000 mg | ORAL_CAPSULE | Freq: Two times a day (BID) | ORAL | 0 refills | Status: DC

## 2021-01-11 NOTE — ED Triage Notes (Addendum)
Patient c/o cough and nasal congestion that started 1 week ago. Denies fever. Declines COVID testing at this time.

## 2021-01-11 NOTE — Discharge Instructions (Signed)
You were seen for cough and nasal congestion and are being treated for sinusitis/bronchitis.   Take the antibiotics as prescribed until they're finished. If you think you're having a reaction, stop the medication, take benadryl and go to the nearest urgent care/emergency room. Take a probiotic while taking the antibiotic to decrease the chances of stomach upset.   Continue using over-the-counter medications such as cetirizine and Flonase.  Increase your Flonase use to 2 times a day.  A humidifier at nighttime can also help with your symptoms.  Take care, Dr. Marland Kitchen, NP-c

## 2021-01-11 NOTE — ED Provider Notes (Signed)
[facility] - [place], [place]   Name: Ashley Kidd DOB: August 11, 1981 MRN: [id number] CSN: [id number] PCP: Valerie Roys, DO  Arrival date and time:  01/11/21 1348  Chief Complaint:  Cough and Nasal Congestion   NOTE: Prior to seeing the patient today, I have reviewed the triage nursing documentation and vital signs. Clinical staff has updated patient's PMH/PSHx, current medication list, and drug allergies/intolerances to ensure comprehensive history available to assist in medical decision making.   History:   HPI: Ashley Kidd is a 40 y.o. female who presents today with complaints of nasal congestion and productive cough.  Patient noticed the symptoms increasing approximately 1 week ago.  She takes cetirizine, Flonase, and Singulair daily along with her Symbicort inhaler.  She is albuterol inhaler as needed for wheezing.  She denies fever, body aches, nausea vomiting.    She received an E-visit approximately 1 month ago and was prescribed Augmentin for similar symptoms.  She was better, but symptoms returned.   Past Medical History:  Diagnosis Date  . Allergy   . Asthma   . GERD (gastroesophageal reflux disease)   . Migraine   . PCOS (polycystic ovarian syndrome)   . PCOS (polycystic ovarian syndrome)   . Sinus complaint   . Type 2 diabetes mellitus without complication, without long-term current use of insulin ([place]) 07/26/2019    Past Surgical History:  Procedure Laterality Date  . CHOLECYSTECTOMY  06-09-13  . OVARIAN CYST REMOVAL Bilateral 2002  . UPPER GI ENDOSCOPY  11-29-12   Dr Candace Cruise    Family History  Problem Relation Age of Onset  . Diabetes Mother   . Asthma Mother   . Diabetes Father   . Heart attack Father   . Heart disease Father   . Asthma Sister   . Bipolar disorder Sister   . Asthma Brother   . Bipolar disorder Brother   . Epilepsy Brother     Social History   Tobacco Use  . Smoking status: Former Smoker    Years: 12.00    Quit  date: 08/24/2012    Years since quitting: 8.3  . Smokeless tobacco: Never Used  Vaping Use  . Vaping Use: Never used  Substance Use Topics  . Alcohol use: No    Alcohol/week: 0.0 standard drinks  . Drug use: No    Patient Active Problem List   Diagnosis Date Noted  . Type 2 diabetes mellitus without complication, without long-term current use of insulin ([place]) 07/26/2019  . Moderate persistent asthma without complication 35/00/9381  . C. difficile diarrhea 12/24/2015  . Migraine   . Allergy   . Low back pain 03/26/2015  . Chronic cholecystitis without calculus 05/19/2013    Home Medications:    Current Meds  Medication Sig  . albuterol (VENTOLIN HFA) 108 (90 Base) MCG/ACT inhaler INHALE 2 PUFFS INTO THE LUNGS EVERY 6 HOURS AS NEEDED FOR WHEEZING  . aspirin 81 MG tablet Take 81 mg by mouth daily.  . budesonide-formoterol (SYMBICORT) 160-4.5 MCG/ACT inhaler Inhale 2 puffs into the lungs 2 (two) times daily.  . cefdinir (OMNICEF) 300 MG capsule Take 1 capsule (300 mg total) by mouth 2 (two) times daily.  . cetirizine (ZYRTEC) 10 MG tablet Take 10 mg by mouth daily.  . citalopram (CELEXA) 20 MG tablet Take 20 mg by mouth daily.  . fluticasone (FLONASE) 50 MCG/ACT nasal spray INSTILL 2 SPRAYS IN EACH NOSTRIL EVERY DAY  . montelukast (SINGULAIR) 10 MG tablet TAKE  1 TABLET(10 MG) BY MOUTH AT BEDTIME  . omeprazole (PRILOSEC) 40 MG capsule Take 1 capsule (40 mg total) by mouth daily.  . predniSONE (STERAPRED UNI-PAK 21 TAB) 10 MG (21) TBPK tablet Take as instructed on package (60, 50, 40, 30, 20, 10)  . SUMAtriptan (IMITREX) 100 MG tablet Take 1 tablet (100 mg total) by mouth every 2 (two) hours as needed for migraine. One tab at onset and may repeat x1 in 1 hour. Max 200 mg/24 hours  . umeclidinium bromide (INCRUSE ELLIPTA) 62.5 MCG/INH AEPB Inhale 1 puff into the lungs daily.  Marland Kitchen ZOVIA 1/35E, 28, 1-35 MG-MCG tablet Take by mouth daily.    Allergies:   Hydrocodone, Tussionex  pennkinetic er [hydrocod polst-cpm polst er], and Bee venom  Review of Systems (ROS): Review of Systems  Constitutional: Positive for fatigue and fever. Negative for activity change, appetite change and diaphoresis.  HENT: Positive for congestion, ear pain, postnasal drip, sinus pressure, sinus pain and sore throat. Negative for ear discharge, facial swelling, hearing loss and sneezing.   Eyes: Negative for pain.  Respiratory: Positive for cough. Negative for shortness of breath and wheezing.   Gastrointestinal: Negative for constipation, nausea and vomiting.  Musculoskeletal: Negative for myalgias.  All other systems reviewed and are negative.    Vital Signs: Today's Vitals   01/11/21 1359 01/11/21 1400 01/11/21 1402  BP:   (!) 141/85  Pulse:   85  Resp:   20  Temp:   98.6 F (37 C)  TempSrc:   Oral  SpO2:   99%  Weight:  195 lb (88.5 kg)   Height:  5\' 7"  (1.702 m)   PainSc: 0-No pain      Physical Exam: Physical Exam Vitals and nursing note reviewed.  Constitutional:      Appearance: Normal appearance.  HENT:     Right Ear: Tympanic membrane normal.     Left Ear: Tympanic membrane normal.     Nose:     Right Sinus: No maxillary sinus tenderness or frontal sinus tenderness.     Left Sinus: Maxillary sinus tenderness present. No frontal sinus tenderness.     Mouth/Throat:     Mouth: Mucous membranes are moist.     Pharynx: Posterior oropharyngeal erythema present.  Eyes:     Pupils: Pupils are equal, round, and reactive to light.  Cardiovascular:     Rate and Rhythm: Normal rate and regular rhythm.     Pulses: Normal pulses.     Heart sounds: Normal heart sounds.  Pulmonary:     Effort: Pulmonary effort is normal. No accessory muscle usage or respiratory distress.     Breath sounds: Normal breath sounds. Decreased air movement present. No wheezing.  Skin:    General: Skin is warm and dry.     Capillary Refill: Capillary refill takes less than 2 seconds.   Neurological:     Mental Status: She is alert.      Urgent Care Treatments / Results:   LABS: PLEASE NOTE: all labs that were ordered this encounter are listed, however only abnormal results are displayed. Labs Reviewed - No data to display  EKG: -None  RADIOLOGY: No results found.  PROCEDURES: Procedures  MEDICATIONS RECEIVED THIS VISIT: Medications - No data to display  PERTINENT CLINICAL COURSE NOTES/UPDATES:   Initial Impression / Assessment and Plan / Urgent Care Course:  Pertinent labs & imaging results that were available during my care of the patient were personally reviewed by me and considered  in my medical decision making (see lab/imaging section of note for values and interpretations).  SHADELL BRENN is a 40 y.o. female who presents to [facility] today with complaints of nasal congestion and cough, diagnosed with sinobronchitis, and treated as such with the medications below. NP and patient reviewed discharge instructions below during visit.   Patient is well appearing overall in clinic today. She does not appear to be in any acute distress. Presenting symptoms (see HPI) and exam as documented above.   I have reviewed the follow up and strict return precautions for any new or worsening symptoms. Patient is aware of symptoms that would be deemed urgent/emergent, and would thus require further evaluation either here or in the emergency department. At the time of discharge, she verbalized understanding and consent with the discharge plan as it was reviewed with her. All questions were fielded by provider and/or clinic staff prior to patient discharge.    Final Clinical Impressions / Urgent Care Diagnoses:   Final diagnoses:  Sinobronchitis    New Prescriptions:  [place] Controlled Substance Registry consulted? Not Applicable  Meds ordered this encounter  Medications  . cefdinir (OMNICEF) 300 MG capsule    Sig: Take 1 capsule (300 mg total) by mouth 2  (two) times daily.    Dispense:  14 capsule    Refill:  0  . predniSONE (STERAPRED UNI-PAK 21 TAB) 10 MG (21) TBPK tablet    Sig: Take as instructed on package (60, 50, 40, 30, 20, 10)    Dispense:  1 each    Refill:  0      Discharge Instructions     You were seen for cough and nasal congestion and are being treated for sinusitis/bronchitis.   Take the antibiotics as prescribed until they're finished. If you think you're having a reaction, stop the medication, take benadryl and go to the nearest urgent care/emergency room. Take a probiotic while taking the antibiotic to decrease the chances of stomach upset.   Continue using over-the-counter medications such as cetirizine and Flonase.  Increase your Flonase use to 2 times a day.  A humidifier at nighttime can also help with your symptoms.  Take care, Dr. Marland Kitchen, NP-c     Recommended Follow up Care:  Patient encouraged to follow up with the following provider within the specified time frame, or sooner as dictated by the severity of her symptoms. As always, she was instructed that for any urgent/emergent care needs, she should seek care either here or in the emergency department for more immediate evaluation.   Gertie Baron, DNP, NP-c   Gertie Baron, NP 01/11/21 1435

## 2021-01-16 ENCOUNTER — Encounter: Payer: Self-pay | Admitting: Family Medicine

## 2021-01-16 ENCOUNTER — Ambulatory Visit (INDEPENDENT_AMBULATORY_CARE_PROVIDER_SITE_OTHER): Payer: Managed Care, Other (non HMO) | Admitting: Family Medicine

## 2021-01-16 ENCOUNTER — Other Ambulatory Visit: Payer: Self-pay

## 2021-01-16 VITALS — BP 134/86 | HR 83 | Ht 66.75 in | Wt 205.0 lb

## 2021-01-16 DIAGNOSIS — Z0000 Encounter for general adult medical examination without abnormal findings: Secondary | ICD-10-CM | POA: Diagnosis not present

## 2021-01-16 DIAGNOSIS — T7840XD Allergy, unspecified, subsequent encounter: Secondary | ICD-10-CM | POA: Diagnosis not present

## 2021-01-16 DIAGNOSIS — J4540 Moderate persistent asthma, uncomplicated: Secondary | ICD-10-CM | POA: Diagnosis not present

## 2021-01-16 DIAGNOSIS — E119 Type 2 diabetes mellitus without complications: Secondary | ICD-10-CM

## 2021-01-16 DIAGNOSIS — G43709 Chronic migraine without aura, not intractable, without status migrainosus: Secondary | ICD-10-CM

## 2021-01-16 LAB — URINALYSIS, ROUTINE W REFLEX MICROSCOPIC
Bilirubin, UA: NEGATIVE
Glucose, UA: NEGATIVE
Ketones, UA: NEGATIVE
Leukocytes,UA: NEGATIVE
Nitrite, UA: NEGATIVE
Protein,UA: NEGATIVE
RBC, UA: NEGATIVE
Specific Gravity, UA: 1.02 (ref 1.005–1.030)
Urobilinogen, Ur: 0.2 mg/dL (ref 0.2–1.0)
pH, UA: 6 (ref 5.0–7.5)

## 2021-01-16 LAB — MICROALBUMIN, URINE WAIVED
Creatinine, Urine Waived: 200 mg/dL (ref 10–300)
Microalb, Ur Waived: 30 mg/L — ABNORMAL HIGH (ref 0–19)
Microalb/Creat Ratio: <30 mg/g (ref <30)

## 2021-01-16 LAB — BAYER DCA HB A1C WAIVED: HB A1C (BAYER DCA - WAIVED): 6.5 % (ref <7.0)

## 2021-01-16 MED ORDER — FLUTICASONE PROPIONATE 50 MCG/ACT NA SUSP: NASAL | 12 refills | Status: DC

## 2021-01-16 MED ORDER — ALBUTEROL SULFATE HFA 108 (90 BASE) MCG/ACT IN AERS: INHALATION_SPRAY | RESPIRATORY_TRACT | 6 refills | Status: DC

## 2021-01-16 MED ORDER — SUMATRIPTAN SUCCINATE 100 MG PO TABS: 100.0000 mg | ORAL_TABLET | ORAL | 12 refills | Status: DC | PRN

## 2021-01-16 MED ORDER — OMEPRAZOLE 40 MG PO CPDR: 40.0000 mg | DELAYED_RELEASE_CAPSULE | Freq: Every day | ORAL | 1 refills | Status: DC

## 2021-01-16 MED ORDER — MONTELUKAST SODIUM 10 MG PO TABS: ORAL_TABLET | ORAL | 1 refills | Status: DC

## 2021-01-16 MED ORDER — UMECLIDINIUM BROMIDE 62.5 MCG/INH IN AEPB: 1.0000 | INHALATION_SPRAY | Freq: Every day | RESPIRATORY_TRACT | 6 refills | Status: DC

## 2021-01-16 NOTE — Assessment & Plan Note (Signed)
Under good control on current regimen. Continue current regimen. Continue to monitor. Call with any concerns. Refills given.   

## 2021-01-16 NOTE — Patient Instructions (Signed)
Health Maintenance, Female Adopting a healthy lifestyle and getting preventive care are important in promoting health and wellness. Ask your health care provider about:  The right schedule for you to have regular tests and exams.  Things you can do on your own to prevent diseases and keep yourself healthy. What should I know about diet, weight, and exercise? Eat a healthy diet  Eat a diet that includes plenty of vegetables, fruits, low-fat dairy products, and lean protein.  Do not eat a lot of foods that are high in solid fats, added sugars, or sodium.   Maintain a healthy weight Body mass index (BMI) is used to identify weight problems. It estimates body fat based on height and weight. Your health care provider can help determine your BMI and help you achieve or maintain a healthy weight. Get regular exercise Get regular exercise. This is one of the most important things you can do for your health. Most adults should:  Exercise for at least 150 minutes each week. The exercise should increase your heart rate and make you sweat (moderate-intensity exercise).  Do strengthening exercises at least twice a week. This is in addition to the moderate-intensity exercise.  Spend less time sitting. Even light physical activity can be beneficial. Watch cholesterol and blood lipids Have your blood tested for lipids and cholesterol at 40 years of age, then have this test every 5 years. Have your cholesterol levels checked more often if:  Your lipid or cholesterol levels are high.  You are older than 40 years of age.  You are at high risk for heart disease. What should I know about cancer screening? Depending on your health history and family history, you may need to have cancer screening at various ages. This may include screening for:  Breast cancer.  Cervical cancer.  Colorectal cancer.  Skin cancer.  Lung cancer. What should I know about heart disease, diabetes, and high blood  pressure? Blood pressure and heart disease  High blood pressure causes heart disease and increases the risk of stroke. This is more likely to develop in people who have high blood pressure readings, are of African descent, or are overweight.  Have your blood pressure checked: ? Every 3-5 years if you are 18-39 years of age. ? Every year if you are 40 years old or older. Diabetes Have regular diabetes screenings. This checks your fasting blood sugar level. Have the screening done:  Once every three years after age 40 if you are at a normal weight and have a low risk for diabetes.  More often and at a younger age if you are overweight or have a high risk for diabetes. What should I know about preventing infection? Hepatitis B If you have a higher risk for hepatitis B, you should be screened for this virus. Talk with your health care provider to find out if you are at risk for hepatitis B infection. Hepatitis C Testing is recommended for:  Everyone born from 1945 through 1965.  Anyone with known risk factors for hepatitis C. Sexually transmitted infections (STIs)  Get screened for STIs, including gonorrhea and chlamydia, if: ? You are sexually active and are younger than 40 years of age. ? You are older than 40 years of age and your health care provider tells you that you are at risk for this type of infection. ? Your sexual activity has changed since you were last screened, and you are at increased risk for chlamydia or gonorrhea. Ask your health care provider   if you are at risk.  Ask your health care provider about whether you are at high risk for HIV. Your health care provider may recommend a prescription medicine to help prevent HIV infection. If you choose to take medicine to prevent HIV, you should first get tested for HIV. You should then be tested every 3 months for as long as you are taking the medicine. Pregnancy  If you are about to stop having your period (premenopausal) and  you may become pregnant, seek counseling before you get pregnant.  Take 400 to 800 micrograms (mcg) of folic acid every day if you become pregnant.  Ask for birth control (contraception) if you want to prevent pregnancy. Osteoporosis and menopause Osteoporosis is a disease in which the bones lose minerals and strength with aging. This can result in bone fractures. If you are 65 years old or older, or if you are at risk for osteoporosis and fractures, ask your health care provider if you should:  Be screened for bone loss.  Take a calcium or vitamin D supplement to lower your risk of fractures.  Be given hormone replacement therapy (HRT) to treat symptoms of menopause. Follow these instructions at home: Lifestyle  Do not use any products that contain nicotine or tobacco, such as cigarettes, e-cigarettes, and chewing tobacco. If you need help quitting, ask your health care provider.  Do not use street drugs.  Do not share needles.  Ask your health care provider for help if you need support or information about quitting drugs. Alcohol use  Do not drink alcohol if: ? Your health care provider tells you not to drink. ? You are pregnant, may be pregnant, or are planning to become pregnant.  If you drink alcohol: ? Limit how much you use to 0-1 drink a day. ? Limit intake if you are breastfeeding.  Be aware of how much alcohol is in your drink. In the [place] one drink equals one 12 oz bottle of beer (355 mL), one 5 oz glass of wine (148 mL), or one 1 oz glass of hard liquor (44 mL). General instructions  Schedule regular health, dental, and eye exams.  Stay current with your vaccines.  Tell your health care provider if: ? You often feel depressed. ? You have ever been abused or do not feel safe at home. Summary  Adopting a healthy lifestyle and getting preventive care are important in promoting health and wellness.  Follow your health care provider's instructions about healthy  diet, exercising, and getting tested or screened for diseases.  Follow your health care provider's instructions on monitoring your cholesterol and blood pressure. This information is not intended to replace advice given to you by your health care provider. Make sure you discuss any questions you have with your health care provider. Document Revised: 09/07/2018 Document Reviewed: 09/07/2018 [employer] Patient Education  2021 [employer]  

## 2021-01-16 NOTE — Progress Notes (Signed)
BP 134/86   Pulse 83   Ht 5' 6.75" (1.695 m)   Wt 205 lb (93 kg)   LMP 12/28/2020   SpO2 98%   BMI 32.35 kg/m    Subjective:    Patient ID: Ashley Kidd, female    DOB: 18-Feb-1981, 40 y.o.   MRN: [id number]  HPI: Ashley Kidd is a 40 y.o. female presenting on 01/16/2021 for comprehensive medical examination. Current medical complaints include:  DIABETES Hypoglycemic episodes:no Polydipsia/polyuria: yes Visual disturbance: no Chest pain: no Paresthesias: no Glucose Monitoring: no  Accucheck frequency: occasionally Taking Insulin?: no Blood Pressure Monitoring: not checking Retinal Examination: Up to Date Foot Exam: Up to Date Diabetic Education: Completed Pneumovax: Up to Date Influenza: Up to Date Aspirin: no  ASTHMA Asthma status: stable Satisfied with current treatment?: yes Albuterol/rescue inhaler frequency: occasionally Dyspnea frequency: occasionally Wheezing frequency: occasionally Cough frequency: occasionally Nocturnal symptom frequency: none  Limitation of activity: no Current upper respiratory symptoms: yes Aerochamber/spacer use: no Visits to ER or Urgent Care in past year: yes Pneumovax: Up to Date Influenza: Up to Date  Migraines have been good. Stable on current regimen.   She currently lives with: husband and kids Menopausal Symptoms: no  Depression Screen done today and results listed below:  Depression screen [facility] 2/9 01/16/2021 08/28/2019 07/18/2019 07/15/2018 01/06/2018  Decreased Interest 0 0 0 1 1  Down, Depressed, Hopeless 1 0 1 0 1  PHQ - 2 Score 1 0 1 1 2   Altered sleeping - - 2 3 2   Tired, decreased energy - - 2 3 2   Change in appetite - - 0 1 2  Feeling bad or failure about yourself  - - 0 0 0  Trouble concentrating - - 0 3 3  Moving slowly or fidgety/restless - - 0 1 0  Suicidal thoughts - - 0 0 0  PHQ-9 Score - - 5 12 11   Difficult doing work/chores - - Somewhat difficult Somewhat difficult -    Past Medical  History:  Past Medical History:  Diagnosis Date  . Allergy   . Asthma   . GERD (gastroesophageal reflux disease)   . Migraine   . PCOS (polycystic ovarian syndrome)   . PCOS (polycystic ovarian syndrome)   . Sinus complaint   . Type 2 diabetes mellitus without complication, without long-term current use of insulin ([place]) 07/26/2019    Surgical History:  Past Surgical History:  Procedure Laterality Date  . CHOLECYSTECTOMY  06-09-13  . OVARIAN CYST REMOVAL Bilateral 2002  . UPPER GI ENDOSCOPY  11-29-12   Dr Candace Cruise    Medications:  Current Outpatient Medications on File Prior to Visit  Medication Sig  . aspirin 81 MG tablet Take 81 mg by mouth daily.  . cefdinir (OMNICEF) 300 MG capsule Take 1 capsule (300 mg total) by mouth 2 (two) times daily.  . cetirizine (ZYRTEC) 10 MG tablet Take 10 mg by mouth daily.  . citalopram (CELEXA) 20 MG tablet Take 20 mg by mouth daily.  Marland Kitchen EPINEPHrine 0.3 mg/0.3 mL IJ SOAJ injection INJ 0.3 ML IM ONCE FOR 1 DOSE  . ondansetron (ZOFRAN) 4 MG tablet Take 1 tablet (4 mg total) by mouth every 6 (six) hours.  . predniSONE (STERAPRED UNI-PAK 21 TAB) 10 MG (21) TBPK tablet Take as instructed on package (60, 50, 40, 30, 20, 10)  . Probiotic Product (PROBIOTIC DAILY PO) Take 1 capsule by mouth daily.  Marland Kitchen ZOVIA 1/35E, 28, 1-35 MG-MCG tablet Take by mouth  daily.  . [DISCONTINUED] ipratropium (ATROVENT) 0.06 % nasal spray Place 2 sprays into both nostrils 4 (four) times daily as needed for rhinitis.   No current facility-administered medications on file prior to visit.    Allergies:  Allergies  Allergen Reactions  . Hydrocodone Hives and Itching  . Tussionex Pennkinetic Er [Hydrocod Polst-Cpm Polst Er] Hives and Itching  . Bee Venom Hives    Social History:  Social History   Socioeconomic History  . Marital status: Married    Spouse name: Not on file  . Number of children: Not on file  . Years of education: Not on file  . Highest education level: Not on  file  Occupational History  . Not on file  Tobacco Use  . Smoking status: Former Smoker    Years: 12.00    Quit date: 08/24/2012    Years since quitting: 8.4  . Smokeless tobacco: Never Used  Vaping Use  . Vaping Use: Never used  Substance and Sexual Activity  . Alcohol use: No    Alcohol/week: 0.0 standard drinks  . Drug use: No  . Sexual activity: Yes  Other Topics Concern  . Not on file  Social History Narrative  . Not on file   Social Determinants of Health   Financial Resource Strain: Not on file  Food Insecurity: Not on file  Transportation Needs: Not on file  Physical Activity: Not on file  Stress: Not on file  Social Connections: Not on file  Intimate Partner Violence: Not on file   Social History   Tobacco Use  Smoking Status Former Smoker  . Years: 12.00  . Quit date: 08/24/2012  . Years since quitting: 8.4  Smokeless Tobacco Never Used   Social History   Substance and Sexual Activity  Alcohol Use No  . Alcohol/week: 0.0 standard drinks    Family History:  Family History  Problem Relation Age of Onset  . Diabetes Mother   . Asthma Mother   . Diabetes Father   . Heart attack Father   . Heart disease Father   . Asthma Sister   . Bipolar disorder Sister   . Asthma Brother   . Bipolar disorder Brother   . Epilepsy Brother     Past medical history, surgical history, medications, allergies, family history and social history reviewed with patient today and changes made to appropriate areas of the chart.   Review of Systems  Constitutional: Negative.   HENT: Negative.   Eyes: Negative.   Respiratory: Positive for cough and wheezing. Negative for hemoptysis, sputum production and shortness of breath.   Cardiovascular: Positive for leg swelling. Negative for chest pain, palpitations, orthopnea, claudication and PND.  Gastrointestinal: Negative.   Genitourinary: Negative.   Musculoskeletal: Negative.   Skin: Negative.   Neurological: Negative.         Occasional shooting pains in her feet  Endo/Heme/Allergies: Positive for environmental allergies and polydipsia. Bruises/bleeds easily.  Psychiatric/Behavioral: Negative for depression, hallucinations, memory loss, substance abuse and suicidal ideas. The patient is nervous/anxious. The patient does not have insomnia.    All other ROS negative except what is listed above and in the HPI.      Objective:    BP 134/86   Pulse 83   Ht 5' 6.75" (1.695 m)   Wt 205 lb (93 kg)   LMP 12/28/2020   SpO2 98%   BMI 32.35 kg/m   Wt Readings from Last 3 Encounters:  01/16/21 205 lb (93 kg)  01/11/21  195 lb (88.5 kg)  10/15/20 195 lb (88.5 kg)    Physical Exam Vitals and nursing note reviewed.  Constitutional:      General: She is not in acute distress.    Appearance: Normal appearance. She is not ill-appearing, toxic-appearing or diaphoretic.  HENT:     Head: Normocephalic and atraumatic.     Right Ear: Tympanic membrane, ear canal and external ear normal. There is no impacted cerumen.     Left Ear: Tympanic membrane, ear canal and external ear normal. There is no impacted cerumen.     Nose: Nose normal. No congestion or rhinorrhea.     Mouth/Throat:     Mouth: Mucous membranes are moist.     Pharynx: Oropharynx is clear. No oropharyngeal exudate or posterior oropharyngeal erythema.  Eyes:     General: No scleral icterus.       Right eye: No discharge.        Left eye: No discharge.     Extraocular Movements: Extraocular movements intact.     Conjunctiva/sclera: Conjunctivae normal.     Pupils: Pupils are equal, round, and reactive to light.  Neck:     Vascular: No carotid bruit.  Cardiovascular:     Rate and Rhythm: Normal rate and regular rhythm.     Pulses: Normal pulses.     Heart sounds: No murmur heard. No friction rub. No gallop.   Pulmonary:     Effort: Pulmonary effort is normal. No respiratory distress.     Breath sounds: Normal breath sounds. No stridor. No  wheezing, rhonchi or rales.  Chest:     Chest wall: No tenderness.  Abdominal:     General: Abdomen is flat. Bowel sounds are normal. There is no distension.     Palpations: Abdomen is soft. There is no mass.     Tenderness: There is no abdominal tenderness. There is no right CVA tenderness, left CVA tenderness, guarding or rebound.     Hernia: No hernia is present.  Genitourinary:    Comments: Breast and pelvic exams deferred with shared decision making Musculoskeletal:        General: No swelling, tenderness, deformity or signs of injury.     Cervical back: Normal range of motion and neck supple. No rigidity. No muscular tenderness.     Right lower leg: No edema.     Left lower leg: No edema.  Lymphadenopathy:     Cervical: No cervical adenopathy.  Skin:    General: Skin is warm and dry.     Capillary Refill: Capillary refill takes less than 2 seconds.     Coloration: Skin is not jaundiced or pale.     Findings: No bruising, erythema, lesion or rash.  Neurological:     General: No focal deficit present.     Mental Status: She is alert and oriented to person, place, and time. Mental status is at baseline.     Cranial Nerves: No cranial nerve deficit.     Sensory: No sensory deficit.     Motor: No weakness.     Coordination: Coordination normal.     Gait: Gait normal.     Deep Tendon Reflexes: Reflexes normal.  Psychiatric:        Mood and Affect: Mood normal.        Behavior: Behavior normal.        Thought Content: Thought content normal.        Judgment: Judgment normal.     Results for orders  placed or performed during the hospital encounter of 10/15/20  SARS CORONAVIRUS 2 (TAT 6-24 HRS) Nasopharyngeal Throat   Specimen: Throat; Nasopharyngeal  Result Value Ref Range   SARS Coronavirus 2 POSITIVE (A) NEGATIVE  Culture, group A strep   Specimen: Throat  Result Value Ref Range   Specimen Description      THROAT Performed at [facility] [place], [address] [place], [place] [address]    Special Requests      NONE Performed at [facility], [address] [place], [place] [address]    Culture      NO GROUP A STREP (S.PYOGENES) ISOLATED Performed at [facility], [place] [address] [place], [place] [address]    Report Status 10/18/2020 FINAL       Assessment & Plan:   Problem List Items Addressed This Visit      Cardiovascular and Mediastinum   Migraine    Under good control on current regimen. Continue current regimen. Continue to monitor. Call with any concerns. Refills given.        Relevant Medications   SUMAtriptan (IMITREX) 100 MG tablet     Respiratory   Moderate persistent asthma without complication    Under good control on current regimen. Continue current regimen. Continue to monitor. Call with any concerns. Refills given.        Relevant Medications   umeclidinium bromide (INCRUSE ELLIPTA) 62.5 MCG/INH AEPB   montelukast (SINGULAIR) 10 MG tablet   albuterol (VENTOLIN HFA) 108 (90 Base) MCG/ACT inhaler   Other Relevant Orders   CBC with Differential/Platelet   TSH     Endocrine   Type 2 diabetes mellitus without complication, without long-term current use of insulin ([facility])    Under good control on current regimen with A1c of 6.5. Continue current regimen. Continue to monitor. Call with any concerns. Refills given. Labs drawn today.       Relevant Orders   Bayer DCA Hb A1c Waived   Microalbumin, Urine Waived   Comprehensive metabolic panel   Lipid Panel w/o Chol/HDL Ratio   Urinalysis, Routine w reflex microscopic     Other   Allergy   Relevant Medications   fluticasone (FLONASE) 50 MCG/ACT nasal spray    Other Visit Diagnoses    Routine general medical examination at a health care facility    -  Primary   Vaccines up to date. Screening labs checked today. Pap up to date. Continue diet and exercise. Call with any concerns.    Relevant Orders   Bayer DCA Hb A1c Waived    Microalbumin, Urine Waived   CBC with Differential/Platelet   Comprehensive metabolic panel   Lipid Panel w/o Chol/HDL Ratio   Urinalysis, Routine w reflex microscopic   TSH       Follow up plan: Return in about 6 months (around 07/18/2021).   LABORATORY TESTING:  - Pap smear: up to date  IMMUNIZATIONS:   - Tdap: Tetanus vaccination status reviewed: last tetanus booster within 10 years. - Influenza: Up to date - Pneumovax: Up to date - COVID: Up to date   PATIENT COUNSELING:   Advised to take 1 mg of folate supplement per day if capable of pregnancy.   Sexuality: Discussed sexually transmitted diseases, partner selection, use of condoms, avoidance of unintended pregnancy  and contraceptive alternatives.   Advised to avoid cigarette smoking.  I discussed with the patient that most people either abstain from alcohol or drink within safe limits (<=  14/week and <=4 drinks/occasion for males, <=7/weeks and <= 3 drinks/occasion for females) and that the risk for alcohol disorders and other health effects rises proportionally with the number of drinks per week and how often a drinker exceeds daily limits.  Discussed cessation/primary prevention of drug use and availability of treatment for abuse.   Diet: Encouraged to adjust caloric intake to maintain  or achieve ideal body weight, to reduce intake of dietary saturated fat and total fat, to limit sodium intake by avoiding high sodium foods and not adding table salt, and to maintain adequate dietary potassium and calcium preferably from fresh fruits, vegetables, and low-fat dairy products.    stressed the importance of regular exercise  Injury prevention: Discussed safety belts, safety helmets, smoke detector, smoking near bedding or upholstery.   Dental health: Discussed importance of regular tooth brushing, flossing, and dental visits.    NEXT PREVENTATIVE PHYSICAL DUE IN 1 YEAR. Return in about 6 months (around  07/18/2021).

## 2021-01-16 NOTE — Assessment & Plan Note (Addendum)
Under good control on current regimen with A1c of 6.5. Continue current regimen. Continue to monitor. Call with any concerns. Refills given. Labs drawn today.

## 2021-01-17 LAB — COMPREHENSIVE METABOLIC PANEL
ALT: 10 IU/L (ref 0–32)
AST: 14 IU/L (ref 0–40)
Albumin/Globulin Ratio: 1.4 (ref 1.2–2.2)
Albumin: 4.2 g/dL (ref 3.8–4.8)
Alkaline Phosphatase: 126 IU/L — ABNORMAL HIGH (ref 44–121)
BUN/Creatinine Ratio: 14 (ref 9–23)
BUN: 9 mg/dL (ref 6–20)
Bilirubin Total: 0.3 mg/dL (ref 0.0–1.2)
CO2: 20 mmol/L (ref 20–29)
Calcium: 9 mg/dL (ref 8.7–10.2)
Chloride: 101 mmol/L (ref 96–106)
Creatinine, Ser: 0.66 mg/dL (ref 0.57–1.00)
Globulin, Total: 2.9 g/dL (ref 1.5–4.5)
Glucose: 125 mg/dL — ABNORMAL HIGH (ref 65–99)
Potassium: 4.8 mmol/L (ref 3.5–5.2)
Sodium: 138 mmol/L (ref 134–144)
Total Protein: 7.1 g/dL (ref 6.0–8.5)
eGFR: 114 mL/min/{1.73_m2} (ref >59)

## 2021-01-17 LAB — TSH: TSH: 1.76 u[IU]/mL (ref 0.450–4.500)

## 2021-01-17 LAB — LIPID PANEL W/O CHOL/HDL RATIO
Cholesterol, Total: 256 mg/dL — ABNORMAL HIGH (ref 100–199)
HDL: 49 mg/dL (ref >39)
LDL Chol Calc (NIH): 153 mg/dL — ABNORMAL HIGH (ref 0–99)
Triglycerides: 295 mg/dL — ABNORMAL HIGH (ref 0–149)
VLDL Cholesterol Cal: 54 mg/dL — ABNORMAL HIGH (ref 5–40)

## 2021-01-17 LAB — CBC WITH DIFFERENTIAL/PLATELET
Basophils Absolute: 0.1 10*3/uL (ref 0.0–0.2)
Basos: 1 %
EOS (ABSOLUTE): 0.1 10*3/uL (ref 0.0–0.4)
Eos: 1 %
Hematocrit: 39.2 % (ref 34.0–46.6)
Hemoglobin: 12.4 g/dL (ref 11.1–15.9)
Immature Grans (Abs): 0.1 10*3/uL (ref 0.0–0.1)
Immature Granulocytes: 1 %
Lymphocytes Absolute: 2.5 10*3/uL (ref 0.7–3.1)
Lymphs: 23 %
MCH: 24.6 pg — ABNORMAL LOW (ref 26.6–33.0)
MCHC: 31.6 g/dL (ref 31.5–35.7)
MCV: 78 fL — ABNORMAL LOW (ref 79–97)
Monocytes Absolute: 0.5 10*3/uL (ref 0.1–0.9)
Monocytes: 5 %
Neutrophils Absolute: 7.6 10*3/uL — ABNORMAL HIGH (ref 1.4–7.0)
Neutrophils: 69 %
Platelets: 558 10*3/uL — ABNORMAL HIGH (ref 150–450)
RBC: 5.04 x10E6/uL (ref 3.77–5.28)
RDW: 14.5 % (ref 11.7–15.4)
WBC: 10.7 10*3/uL (ref 3.4–10.8)

## 2021-01-20 ENCOUNTER — Other Ambulatory Visit: Payer: Self-pay | Admitting: Family Medicine

## 2021-02-08 ENCOUNTER — Encounter: Payer: Self-pay | Admitting: Emergency Medicine

## 2021-02-08 ENCOUNTER — Ambulatory Visit
Admission: EM | Admit: 2021-02-08 | Discharge: 2021-02-08 | Disposition: A | Discharge status: Home / Self Care | Payer: Managed Care, Other (non HMO) | Attending: Physician Assistant | Admitting: Physician Assistant

## 2021-02-08 ENCOUNTER — Other Ambulatory Visit: Payer: Self-pay

## 2021-02-08 DIAGNOSIS — G43911 Migraine, unspecified, intractable, with status migrainosus: Secondary | ICD-10-CM

## 2021-02-08 MED ORDER — PROMETHAZINE HCL 25 MG/ML IJ SOLN
25.0000 mg | Freq: Once | INTRAMUSCULAR | Status: AC
  Administered 2021-02-08: 25 mg via INTRAMUSCULAR

## 2021-02-08 MED ORDER — KETOROLAC TROMETHAMINE 10 MG PO TABS: 10.0000 mg | ORAL_TABLET | Freq: Four times a day (QID) | ORAL | 0 refills | Status: AC | PRN

## 2021-02-08 MED ORDER — KETOROLAC TROMETHAMINE 60 MG/2ML IM SOLN
60.0000 mg | Freq: Once | INTRAMUSCULAR | Status: AC
  Administered 2021-02-08: 60 mg via INTRAMUSCULAR

## 2021-02-08 MED ORDER — ONDANSETRON HCL 4 MG PO TABS: 4.0000 mg | ORAL_TABLET | Freq: Three times a day (TID) | ORAL | 0 refills | Status: AC | PRN

## 2021-02-08 NOTE — ED Provider Notes (Signed)
[facility]    CSN: [id number] Arrival date & time: 02/08/21  1233      History   Chief Complaint Chief Complaint  Patient presents with  . Headache    HPI Ashley Kidd is a 40 y.o. female presenting for right-sided headache/pain around the right eye x5 days.  Patient says it varies in severity but does not go away.  She does have history of migraines but does not have her typical symptoms.  She says she normally has some photophobia and phonophobia but does not have that currently.  She does admit to some nausea and feeling lightheaded and dizzy though.  Has taken Imitrex without relief.  Patient has not tried anything else for the pain.  She denies any head injury or loss of consciousness.  Denies vision changes, speech problems or falls.  No vomiting.  Patient denies any sick symptoms including fever, fatigue, body aches, cough, congestion, sore throat, sinus pain, ear pain, chest pain, shortness of breath or abdominal pain.  Patient is to personal history of COVID-19 "earlier this year."  She does not want COVID tested.  No other concerns today.  HPI  Past Medical History:  Diagnosis Date  . Allergy   . Asthma   . GERD (gastroesophageal reflux disease)   . Migraine   . PCOS (polycystic ovarian syndrome)   . PCOS (polycystic ovarian syndrome)   . Sinus complaint   . Type 2 diabetes mellitus without complication, without long-term current use of insulin ([place]) 07/26/2019    Patient Active Problem List   Diagnosis Date Noted  . Type 2 diabetes mellitus without complication, without long-term current use of insulin ([place]) 07/26/2019  . Moderate persistent asthma without complication 40/34/7425  . C. difficile diarrhea 12/24/2015  . Migraine   . Allergy   . Low back pain 03/26/2015  . Chronic cholecystitis without calculus 05/19/2013    Past Surgical History:  Procedure Laterality Date  . CHOLECYSTECTOMY  06-09-13  . OVARIAN CYST REMOVAL Bilateral 2002  .  UPPER GI ENDOSCOPY  11-29-12   Dr Candace Cruise    OB History    Gravida  0   Para  0   Term  0   Preterm  0   AB  0   Living  0     SAB  0   IAB  0   Ectopic  0   Multiple  0   Live Births           Obstetric Comments  1st Menstrual Cycle: 13 1st Pregnancy:  NA         Home Medications    Prior to Admission medications   Medication Sig Start Date End Date Taking? Authorizing Provider  aspirin 81 MG tablet Take 81 mg by mouth daily.   Yes [provider]  cetirizine (ZYRTEC) 10 MG tablet Take 10 mg by mouth daily.   Yes [provider]  citalopram (CELEXA) 20 MG tablet Take 20 mg by mouth daily.   Yes [provider]  fluticasone (FLONASE) 50 MCG/ACT nasal spray INSTILL 2 SPRAYS IN EACH NOSTRIL EVERY DAY 01/16/21  Yes Johnson, Megan P, DO  ketorolac (TORADOL) 10 MG tablet Take 1 tablet (10 mg total) by mouth every 6 (six) hours as needed for up to 3 days (headache). 02/08/21 02/11/21 Yes Laurene Footman B, PA-C  montelukast (SINGULAIR) 10 MG tablet TAKE 1 TABLET(10 MG) BY MOUTH AT BEDTIME 01/16/21  Yes Johnson, Megan P, DO  omeprazole (  PRILOSEC) 40 MG capsule Take 1 capsule (40 mg total) by mouth daily. 01/16/21  Yes Johnson, Megan P, DO  ondansetron (ZOFRAN) 4 MG tablet Take 1 tablet (4 mg total) by mouth every 8 (eight) hours as needed for up to 3 days for nausea or vomiting. 02/08/21 02/11/21 Yes Danton Clap, PA-C  Probiotic Product (PROBIOTIC DAILY PO) Take 1 capsule by mouth daily.   Yes [provider]  SUMAtriptan (IMITREX) 100 MG tablet Take 1 tablet (100 mg total) by mouth every 2 (two) hours as needed for migraine. One tab at onset and may repeat x1 in 1 hour. Max 200 mg/24 hours 01/16/21  Yes Johnson, Megan P, DO  umeclidinium bromide (INCRUSE ELLIPTA) 62.5 MCG/INH AEPB Inhale 1 puff into the lungs daily. 01/16/21  Yes Johnson, Megan P, DO  ZOVIA 1/35E, 28, 1-35 MG-MCG tablet Take by mouth daily. 05/03/16  Yes [provider]   albuterol (VENTOLIN HFA) 108 (90 Base) MCG/ACT inhaler INHALE 2 PUFFS INTO THE LUNGS EVERY 6 HOURS AS NEEDED FOR WHEEZING 01/16/21   Johnson, Megan P, DO  cefdinir (OMNICEF) 300 MG capsule Take 1 capsule (300 mg total) by mouth 2 (two) times daily. 01/11/21   Gertie Baron, NP  EPINEPHrine 0.3 mg/0.3 mL IJ SOAJ injection INJ 0.3 ML IM ONCE FOR 1 DOSE 03/04/18   [provider]  predniSONE (STERAPRED UNI-PAK 21 TAB) 10 MG (21) TBPK tablet Take as instructed on package (60, 50, 40, 30, 20, 10) 01/11/21   Gertie Baron, NP  ipratropium (ATROVENT) 0.06 % nasal spray Place 2 sprays into both nostrils 4 (four) times daily as needed for rhinitis. 10/15/20 01/11/21  Coral Spikes, DO    Family History Family History  Problem Relation Age of Onset  . Diabetes Mother   . Asthma Mother   . Diabetes Father   . Heart attack Father   . Heart disease Father   . Asthma Sister   . Bipolar disorder Sister   . Asthma Brother   . Bipolar disorder Brother   . Epilepsy Brother     Social History Social History   Tobacco Use  . Smoking status: Former Smoker    Years: 12.00    Quit date: 08/24/2012    Years since quitting: 8.4  . Smokeless tobacco: Never Used  Vaping Use  . Vaping Use: Never used  Substance Use Topics  . Alcohol use: No    Alcohol/week: 0.0 standard drinks  . Drug use: No     Allergies   Hydrocodone, Tussionex pennkinetic er [hydrocod polst-cpm polst er], and Bee venom   Review of Systems Review of Systems  Constitutional: Negative for chills, diaphoresis, fatigue and fever.  HENT: Negative for congestion, ear pain, rhinorrhea, sinus pressure, sinus pain and sore throat.   Respiratory: Negative for cough and shortness of breath.   Gastrointestinal: Positive for nausea. Negative for abdominal pain and vomiting.  Musculoskeletal: Negative for arthralgias and myalgias.  Skin: Negative for rash.  Neurological: Positive for dizziness, light-headedness and headaches.  Negative for weakness.  Hematological: Negative for adenopathy.     Physical Exam Triage Vital Signs ED Triage Vitals  Enc Vitals Group     BP 02/08/21 1252 136/88     Pulse Rate 02/08/21 1252 94     Resp 02/08/21 1252 16     Temp 02/08/21 1252 98.6 F (37 C)     Temp Source 02/08/21 1252 Oral     SpO2 02/08/21 1252 99 %  Weight 02/08/21 1249 195 lb (88.5 kg)     Height 02/08/21 1249 5\' 7"  (1.702 m)     Head Circumference --      Peak Flow --      Pain Score 02/08/21 1249 3     Pain Loc --      Pain Edu? --      Excl. in [place]? --    No data found.  Updated Vital Signs BP 136/88 (BP Location: Left Arm)   Pulse 94   Temp 98.6 F (37 C) (Oral)   Resp 16   Ht 5\' 7"  (1.702 m)   Wt 195 lb (88.5 kg)   LMP 01/23/2021 (Approximate)   SpO2 99%   BMI 30.54 kg/m       Physical Exam Vitals and nursing note reviewed.  Constitutional:      General: She is not in acute distress.    Appearance: Normal appearance. She is not ill-appearing or toxic-appearing.  HENT:     Head: Normocephalic and atraumatic.     Right Ear: Tympanic membrane, ear canal and external ear normal.     Left Ear: Tympanic membrane, ear canal and external ear normal.     Nose: Nose normal.     Mouth/Throat:     Mouth: Mucous membranes are moist.     Pharynx: Oropharynx is clear.  Eyes:     General: No scleral icterus.       Right eye: No discharge.        Left eye: No discharge.     Extraocular Movements: Extraocular movements intact.     Conjunctiva/sclera: Conjunctivae normal.     Pupils: Pupils are equal, round, and reactive to light.  Cardiovascular:     Rate and Rhythm: Normal rate and regular rhythm.     Heart sounds: Normal heart sounds.  Pulmonary:     Effort: Pulmonary effort is normal. No respiratory distress.     Breath sounds: Normal breath sounds.  Musculoskeletal:     Cervical back: Neck supple.  Skin:    General: Skin is dry.  Neurological:     General: No focal deficit  present.     Mental Status: She is alert and oriented to person, place, and time. Mental status is at baseline.     Cranial Nerves: No cranial nerve deficit.     Motor: No weakness.     Gait: Gait normal.  Psychiatric:        Mood and Affect: Mood normal.        Behavior: Behavior normal.        Thought Content: Thought content normal.      UC Treatments / Results  Labs (all labs ordered are listed, but only abnormal results are displayed) Labs Reviewed - No data to display  EKG   Radiology No results found.  Procedures Procedures (including critical care time)  Medications Ordered in UC Medications  ketorolac (TORADOL) injection 60 mg (has no administration in time range)  promethazine (PHENERGAN) injection 25 mg (has no administration in time range)    Initial Impression / Assessment and Plan / UC Course  I have reviewed the triage vital signs and the nursing notes.  Pertinent labs & imaging results that were available during my care of the patient were reviewed by me and considered in my medical decision making (see chart for details).   40 year old female presenting for migraine headache x5 days.  Has tried Imitrex once without relief.  Not taking any  other medications.  Exam is benign today.  Given her history of migraines, suspect this is consistent with migraine headache.  Vitals are all stable.  She is overall well-appearing, but does appear to be in pain with her headache.  Patient given 60 mg IM ketorolac and 25 mg IM Phenergan in clinic.  Prescribed p.o. Toradol and Zofran p.o. as needed for symptoms.  Advised patient that she is in status migrainosus which is more difficult to treat so she is not improving over the next day with these medications that she may need to go to the emergency department.  Cautioned her on ED red flag signs and symptoms for headaches as well.  Final Clinical Impressions(s) / UC Diagnoses   Final diagnoses:  Intractable migraine with  status migrainosus, unspecified migraine type     Discharge Instructions     HEADACHE: You were seen in clinic today for headache. Rest and take meds as directed. If at any point, the headache becomes very severe, is associated with fever, is associated with neck pain/stiffness, you feel like passing out, the headache is different from any you've have had before, there are vision changes/issues with speech/issues with balance, or numbness/weakness in a part of the body, you should be seen urgently or emergently for more serious causes of headache     ED Prescriptions    Medication Sig Dispense Auth. Provider   ketorolac (TORADOL) 10 MG tablet Take 1 tablet (10 mg total) by mouth every 6 (six) hours as needed for up to 3 days (headache). 15 tablet Laurene Footman B, PA-C   ondansetron (ZOFRAN) 4 MG tablet Take 1 tablet (4 mg total) by mouth every 8 (eight) hours as needed for up to 3 days for nausea or vomiting. 12 tablet Gretta Cool     PDMP not reviewed this encounter.   Danton Clap, PA-C 02/08/21 1406

## 2021-02-08 NOTE — Discharge Instructions (Addendum)
HEADACHE: You were seen in clinic today for headache. Rest and take meds as directed. If at any point, the headache becomes very severe, is associated with fever, is associated with neck pain/stiffness, you feel like passing out, the headache is different from any you've have had before, there are vision changes/issues with speech/issues with balance, or numbness/weakness in a part of the body, you should be seen urgently or emergently for more serious causes of headache  °

## 2021-02-08 NOTE — ED Triage Notes (Signed)
Patient reports headache for the past 5 days.  Patient reports nausea and dizziness that started this morning.  Patient reports history of migraines but feels this is different. Patient denies fevers.

## 2021-03-06 ENCOUNTER — Other Ambulatory Visit: Payer: Self-pay

## 2021-03-06 ENCOUNTER — Ambulatory Visit: Payer: Managed Care, Other (non HMO) | Admitting: Family Medicine

## 2021-03-06 ENCOUNTER — Ambulatory Visit
Admission: RE | Admit: 2021-03-06 | Discharge: 2021-03-06 | Disposition: A | Discharge status: Home / Self Care | Payer: Managed Care, Other (non HMO) | Source: Ambulatory Visit | Attending: Family Medicine | Admitting: Family Medicine

## 2021-03-06 ENCOUNTER — Ambulatory Visit
Admission: RE | Admit: 2021-03-06 | Discharge: 2021-03-06 | Disposition: A | Discharge status: Home / Self Care | Payer: Managed Care, Other (non HMO) | Attending: Family Medicine | Admitting: Family Medicine

## 2021-03-06 ENCOUNTER — Encounter: Payer: Self-pay | Admitting: Family Medicine

## 2021-03-06 VITALS — BP 146/88 | HR 84 | Temp 98.1°F | Wt 195.0 lb

## 2021-03-06 DIAGNOSIS — M545 Low back pain, unspecified: Secondary | ICD-10-CM

## 2021-03-06 LAB — URINALYSIS, ROUTINE W REFLEX MICROSCOPIC
Bilirubin, UA: NEGATIVE
Glucose, UA: NEGATIVE
Ketones, UA: NEGATIVE
Leukocytes,UA: NEGATIVE
Nitrite, UA: NEGATIVE
Protein,UA: NEGATIVE
RBC, UA: NEGATIVE
Specific Gravity, UA: 1.02 (ref 1.005–1.030)
Urobilinogen, Ur: 0.2 mg/dL (ref 0.2–1.0)
pH, UA: 6 (ref 5.0–7.5)

## 2021-03-06 MED ORDER — NAPROXEN 500 MG PO TABS: 500.0000 mg | ORAL_TABLET | Freq: Two times a day (BID) | ORAL | 0 refills | Status: DC

## 2021-03-06 MED ORDER — CYCLOBENZAPRINE HCL 10 MG PO TABS: 10.0000 mg | ORAL_TABLET | Freq: Three times a day (TID) | ORAL | 3 refills | Status: DC | PRN

## 2021-03-06 MED ORDER — KETOROLAC TROMETHAMINE 60 MG/2ML IM SOLN
60.0000 mg | Freq: Once | INTRAMUSCULAR | Status: AC
  Administered 2021-03-06: 60 mg via INTRAMUSCULAR

## 2021-03-06 NOTE — Assessment & Plan Note (Signed)
UA clear. Will treat with flexeril and naproxen and toradol shot today. Recheck 2 weeks. Will obtain x-ray. Await results. Treat as needed.

## 2021-03-06 NOTE — Progress Notes (Signed)
BP (!) 146/88   Pulse 84   Temp 98.1 F (36.7 C)   Wt 195 lb (88.5 kg)   SpO2 98%   BMI 30.54 kg/m    Subjective:    Patient ID: Ashley Kidd, female    DOB: 1981/07/08, 40 y.o.   MRN: [id number]  HPI: Ashley Kidd is a 40 y.o. female  Chief Complaint  Patient presents with   Back Pain    Patient states she has been having back pain for a few weeks, seems to be worse in her lower back    BACK PAIN Duration: couple of weeks Mechanism of injury:  fell in the shower Location: bilateral low back Onset: sudden Severity: moderate Quality: aching and sharp and shooting Frequency:  waxing and waning Radiation: none Aggravating factors: lifting, movement, walking, laying, and bending Alleviating factors:  tiger balm, biofreeze, rest, ice, heat, laying, NSAIDs, and APAP Status: worse Treatments attempted: tiger balm, biofreeze, rest, ice, heat, laying, NSAIDs, and APAP  Relief with NSAIDs?: mild Nighttime pain:  no Paresthesias / decreased sensation:  no Bowel / bladder incontinence:  no Fevers:  no Dysuria / urinary frequency:  no   Relevant past medical, surgical, family and social history reviewed and updated as indicated. Interim medical history since our last visit reviewed. Allergies and medications reviewed and updated.  Review of Systems  Constitutional: Negative.   Respiratory: Negative.    Cardiovascular: Negative.   Gastrointestinal: Negative.   Musculoskeletal:  Positive for back pain and myalgias. Negative for arthralgias, gait problem, joint swelling, neck pain and neck stiffness.  Skin: Negative.   Psychiatric/Behavioral: Negative.     Per HPI unless specifically indicated above     Objective:    BP (!) 146/88   Pulse 84   Temp 98.1 F (36.7 C)   Wt 195 lb (88.5 kg)   SpO2 98%   BMI 30.54 kg/m   Wt Readings from Last 3 Encounters:  03/06/21 195 lb (88.5 kg)  02/08/21 195 lb (88.5 kg)  01/16/21 205 lb (93 kg)    Physical  Exam Vitals and nursing note reviewed.  Constitutional:      General: She is not in acute distress.    Appearance: Normal appearance. She is not ill-appearing, toxic-appearing or diaphoretic.     Comments: Uncomfortable appearing  HENT:     Head: Normocephalic and atraumatic.     Right Ear: External ear normal.     Left Ear: External ear normal.     Nose: Nose normal.     Mouth/Throat:     Mouth: Mucous membranes are moist.     Pharynx: Oropharynx is clear.  Eyes:     General: No scleral icterus.       Right eye: No discharge.        Left eye: No discharge.     Extraocular Movements: Extraocular movements intact.     Conjunctiva/sclera: Conjunctivae normal.     Pupils: Pupils are equal, round, and reactive to light.  Cardiovascular:     Rate and Rhythm: Normal rate and regular rhythm.     Pulses: Normal pulses.     Heart sounds: Normal heart sounds. No murmur heard.   No friction rub. No gallop.  Pulmonary:     Effort: Pulmonary effort is normal. No respiratory distress.     Breath sounds: Normal breath sounds. No stridor. No wheezing, rhonchi or rales.  Chest:     Chest wall: No tenderness.  Musculoskeletal:  General: Tenderness (bilateral lumbar paraspinals with hypertonicity) present. Normal range of motion.     Cervical back: Normal range of motion and neck supple.  Skin:    General: Skin is warm and dry.     Capillary Refill: Capillary refill takes less than 2 seconds.     Coloration: Skin is not jaundiced or pale.     Findings: No bruising, erythema, lesion or rash.  Neurological:     General: No focal deficit present.     Mental Status: She is alert and oriented to person, place, and time. Mental status is at baseline.  Psychiatric:        Mood and Affect: Mood normal.        Behavior: Behavior normal.        Thought Content: Thought content normal.        Judgment: Judgment normal.    Results for orders placed or performed in visit on 01/16/21  Bayer  DCA Hb A1c Waived  Result Value Ref Range   HB A1C (BAYER DCA - WAIVED) 6.5 <7.0 %  Microalbumin, Urine Waived  Result Value Ref Range   Microalb, Ur Waived 30 (H) 0 - 19 mg/L   Creatinine, Urine Waived 200 10 - 300 mg/dL   Microalb/Creat Ratio <30 <30 mg/g  CBC with Differential/Platelet  Result Value Ref Range   WBC 10.7 3.4 - 10.8 x10E3/uL   RBC 5.04 3.77 - 5.28 x10E6/uL   Hemoglobin 12.4 11.1 - 15.9 g/dL   Hematocrit 39.2 34.0 - 46.6 %   MCV 78 (L) 79 - 97 fL   MCH 24.6 (L) 26.6 - 33.0 pg   MCHC 31.6 31.5 - 35.7 g/dL   RDW 14.5 11.7 - 15.4 %   Platelets 558 (H) 150 - 450 x10E3/uL   Neutrophils 69 Not Estab. %   Lymphs 23 Not Estab. %   Monocytes 5 Not Estab. %   Eos 1 Not Estab. %   Basos 1 Not Estab. %   Neutrophils Absolute 7.6 (H) 1.4 - 7.0 x10E3/uL   Lymphocytes Absolute 2.5 0.7 - 3.1 x10E3/uL   Monocytes Absolute 0.5 0.1 - 0.9 x10E3/uL   EOS (ABSOLUTE) 0.1 0.0 - 0.4 x10E3/uL   Basophils Absolute 0.1 0.0 - 0.2 x10E3/uL   Immature Granulocytes 1 Not Estab. %   Immature Grans (Abs) 0.1 0.0 - 0.1 x10E3/uL  Comprehensive metabolic panel  Result Value Ref Range   Glucose 125 (H) 65 - 99 mg/dL   BUN 9 6 - 20 mg/dL   Creatinine, Ser 0.66 0.57 - 1.00 mg/dL   eGFR 114 >59 mL/min/1.73   BUN/Creatinine Ratio 14 9 - 23   Sodium 138 134 - 144 mmol/L   Potassium 4.8 3.5 - 5.2 mmol/L   Chloride 101 96 - 106 mmol/L   CO2 20 20 - 29 mmol/L   Calcium 9.0 8.7 - 10.2 mg/dL   Total Protein 7.1 6.0 - 8.5 g/dL   Albumin 4.2 3.8 - 4.8 g/dL   Globulin, Total 2.9 1.5 - 4.5 g/dL   Albumin/Globulin Ratio 1.4 1.2 - 2.2   Bilirubin Total 0.3 0.0 - 1.2 mg/dL   Alkaline Phosphatase 126 (H) 44 - 121 IU/L   AST 14 0 - 40 IU/L   ALT 10 0 - 32 IU/L  Lipid Panel w/o Chol/HDL Ratio  Result Value Ref Range   Cholesterol, Total 256 (H) 100 - 199 mg/dL   Triglycerides 295 (H) 0 - 149 mg/dL   HDL 49 >39 mg/dL  VLDL Cholesterol Cal 54 (H) 5 - 40 mg/dL   LDL Chol Calc (NIH) 153 (H) 0 - 99 mg/dL   Urinalysis, Routine w reflex microscopic  Result Value Ref Range   Specific Gravity, UA 1.020 1.005 - 1.030   pH, UA 6.0 5.0 - 7.5   Color, UA Yellow Yellow   Appearance Ur Clear Clear   Leukocytes,UA Negative Negative   Protein,UA Negative Negative/Trace   Glucose, UA Negative Negative   Ketones, UA Negative Negative   RBC, UA Negative Negative   Bilirubin, UA Negative Negative   Urobilinogen, Ur 0.2 0.2 - 1.0 mg/dL   Nitrite, UA Negative Negative  TSH  Result Value Ref Range   TSH 1.760 0.450 - 4.500 uIU/mL      Assessment & Plan:   Problem List Items Addressed This Visit       Other   Low back pain - Primary    UA clear. Will treat with flexeril and naproxen and toradol shot today. Recheck 2 weeks. Will obtain x-ray. Await results. Treat as needed.        Relevant Medications   naproxen (NAPROSYN) 500 MG tablet   cyclobenzaprine (FLEXERIL) 10 MG tablet   ketorolac (TORADOL) injection 60 mg (Start on 03/06/2021  3:30 PM)   Other Relevant Orders   Urinalysis, Routine w reflex microscopic   DG Lumbar Spine Complete     Follow up plan: Return in about 2 weeks (around 03/20/2021).

## 2021-03-10 NOTE — Progress Notes (Signed)
Contacted via [place] afternoon Marshal, your imaging has returned and no fracture or acute findings noted.  Good news!!

## 2021-03-21 ENCOUNTER — Ambulatory Visit: Payer: Managed Care, Other (non HMO) | Admitting: Family Medicine

## 2021-03-24 ENCOUNTER — Telehealth: Payer: Managed Care, Other (non HMO) | Admitting: Family

## 2021-03-24 DIAGNOSIS — J019 Acute sinusitis, unspecified: Secondary | ICD-10-CM

## 2021-03-24 MED ORDER — AMOXICILLIN-POT CLAVULANATE 875-125 MG PO TABS: 1.0000 | ORAL_TABLET | Freq: Two times a day (BID) | ORAL | 0 refills | Status: DC

## 2021-03-24 NOTE — Progress Notes (Signed)

## 2021-05-23 ENCOUNTER — Emergency Department: Payer: Managed Care, Other (non HMO)

## 2021-05-23 ENCOUNTER — Other Ambulatory Visit: Payer: Self-pay

## 2021-05-23 ENCOUNTER — Emergency Department
Admission: EM | Admit: 2021-05-23 | Discharge: 2021-05-23 | Disposition: A | Discharge status: Home / Self Care | Payer: Managed Care, Other (non HMO) | Attending: Emergency Medicine | Admitting: Emergency Medicine

## 2021-05-23 DIAGNOSIS — R102 Pelvic and perineal pain: Secondary | ICD-10-CM

## 2021-05-23 DIAGNOSIS — J4540 Moderate persistent asthma, uncomplicated: Secondary | ICD-10-CM | POA: Insufficient documentation

## 2021-05-23 DIAGNOSIS — Z7982 Long term (current) use of aspirin: Secondary | ICD-10-CM | POA: Diagnosis not present

## 2021-05-23 DIAGNOSIS — R110 Nausea: Secondary | ICD-10-CM | POA: Diagnosis not present

## 2021-05-23 DIAGNOSIS — R109 Unspecified abdominal pain: Secondary | ICD-10-CM

## 2021-05-23 DIAGNOSIS — N83201 Unspecified ovarian cyst, right side: Secondary | ICD-10-CM | POA: Diagnosis not present

## 2021-05-23 DIAGNOSIS — Z87891 Personal history of nicotine dependence: Secondary | ICD-10-CM | POA: Insufficient documentation

## 2021-05-23 DIAGNOSIS — E119 Type 2 diabetes mellitus without complications: Secondary | ICD-10-CM | POA: Insufficient documentation

## 2021-05-23 LAB — URINALYSIS, COMPLETE (UACMP) WITH MICROSCOPIC
Bilirubin Urine: NEGATIVE
Glucose, UA: NEGATIVE mg/dL
Hgb urine dipstick: NEGATIVE
Ketones, ur: NEGATIVE mg/dL
Leukocytes,Ua: NEGATIVE
Nitrite: NEGATIVE
Protein, ur: NEGATIVE mg/dL
Specific Gravity, Urine: 1.006 (ref 1.005–1.030)
pH: 6 (ref 5.0–8.0)

## 2021-05-23 LAB — CBC
HCT: 38.1 % (ref 36.0–46.0)
Hemoglobin: 12.5 g/dL (ref 12.0–15.0)
MCH: 26.2 pg (ref 26.0–34.0)
MCHC: 32.8 g/dL (ref 30.0–36.0)
MCV: 79.7 fL — ABNORMAL LOW (ref 80.0–100.0)
Platelets: 505 10*3/uL — ABNORMAL HIGH (ref 150–400)
RBC: 4.78 MIL/uL (ref 3.87–5.11)
RDW: 14.4 % (ref 11.5–15.5)
WBC: 12.7 10*3/uL — ABNORMAL HIGH (ref 4.0–10.5)
nRBC: 0 % (ref 0.0–0.2)

## 2021-05-23 LAB — COMPREHENSIVE METABOLIC PANEL
ALT: 19 U/L (ref 0–44)
AST: 18 U/L (ref 15–41)
Albumin: 4 g/dL (ref 3.5–5.0)
Alkaline Phosphatase: 110 U/L (ref 38–126)
Anion gap: 9 (ref 5–15)
BUN: 7 mg/dL (ref 6–20)
CO2: 24 mmol/L (ref 22–32)
Calcium: 9.3 mg/dL (ref 8.9–10.3)
Chloride: 104 mmol/L (ref 98–111)
Creatinine, Ser: 0.66 mg/dL (ref 0.44–1.00)
GFR, Estimated: >60 mL/min (ref >60)
Glucose, Bld: 114 mg/dL — ABNORMAL HIGH (ref 70–99)
Potassium: 3.9 mmol/L (ref 3.5–5.1)
Sodium: 137 mmol/L (ref 135–145)
Total Bilirubin: 0.5 mg/dL (ref 0.3–1.2)
Total Protein: 7.4 g/dL (ref 6.5–8.1)

## 2021-05-23 LAB — POC URINE PREG, ED: Preg Test, Ur: NEGATIVE

## 2021-05-23 MED ORDER — KETOROLAC TROMETHAMINE 60 MG/2ML IM SOLN
60.0000 mg | Freq: Once | INTRAMUSCULAR | Status: AC
  Administered 2021-05-23: 60 mg via INTRAMUSCULAR
  Filled 2021-05-23: qty 2

## 2021-05-23 MED ORDER — PROMETHAZINE HCL 25 MG/ML IJ SOLN
12.5000 mg | Freq: Four times a day (QID) | INTRAMUSCULAR | Status: DC | PRN
  Administered 2021-05-23: 12.5 mg via INTRAMUSCULAR
  Filled 2021-05-23 (×2): qty 1

## 2021-05-23 MED ORDER — PROMETHAZINE HCL 25 MG PO TABS: 25.0000 mg | ORAL_TABLET | Freq: Four times a day (QID) | ORAL | 0 refills | Status: DC | PRN

## 2021-05-23 NOTE — ED Notes (Signed)
Rainbow sent to the lab.  

## 2021-05-23 NOTE — Discharge Instructions (Addendum)
You have been seen in the emergency department for right-sided abdominal pain.  Your work-up shows a cystic structure on the right ovary.  We do recommend that you follow-up with your primary care doctor to arrange an MRI within the next 1-2 weeks to evaluate this lesion.  Return to the emergency department for any worsening pain or development of fever.

## 2021-05-23 NOTE — ED Triage Notes (Signed)
Pt with right flank pain and nausea that began yesterday afternoon. Pt states pain was sudden. Pt with history of cholecystectomy, denies known hematuria or fever. Pt appears uncomfortable.

## 2021-05-23 NOTE — ED Provider Notes (Signed)
[facility] Emergency Department Provider Note  Time seen: 9:01 PM  I have reviewed the triage vital signs and the nursing notes.   HISTORY  Chief Complaint Flank Pain   HPI Ashley Kidd is a 40 y.o. female with a past medical history of allergies, asthma, GERD, diabetes, presents emergency department for right flank pain.  According to the patient over the past 1 week she has been experiencing intermittent right flank pain however worse over the past 2 days and now constant today.  Patient denies any dysuria or hematuria.  No history of kidney stones.  Patient had a cholecystectomy in 2014.  No fever.  States nausea but denies vomiting or diarrhea.   Past Medical History:  Diagnosis Date   Allergy    Asthma    GERD (gastroesophageal reflux disease)    Migraine    PCOS (polycystic ovarian syndrome)    PCOS (polycystic ovarian syndrome)    Sinus complaint    Type 2 diabetes mellitus without complication, without long-term current use of insulin ([place]) 07/26/2019    Patient Active Problem List   Diagnosis Date Noted   Type 2 diabetes mellitus without complication, without long-term current use of insulin ([place]) 07/26/2019   Moderate persistent asthma without complication [id number]   C. difficile diarrhea 12/24/2015   Migraine    Allergy    Low back pain 03/26/2015   Chronic cholecystitis without calculus 05/19/2013    Past Surgical History:  Procedure Laterality Date   CHOLECYSTECTOMY  06-09-13   OVARIAN CYST REMOVAL Bilateral 2002   UPPER GI ENDOSCOPY  11-29-12   Dr Candace Cruise    Prior to Admission medications   Medication Sig Start Date End Date Taking? Authorizing Provider  albuterol (VENTOLIN HFA) 108 (90 Base) MCG/ACT inhaler INHALE 2 PUFFS INTO THE LUNGS EVERY 6 HOURS AS NEEDED FOR WHEEZING 01/16/21   Johnson, Megan P, DO  amoxicillin-clavulanate (AUGMENTIN) 875-125 MG tablet Take 1 tablet by mouth 2 (two) times daily. 03/24/21   Evelina Dun A,  FNP  aspirin 81 MG tablet Take 81 mg by mouth daily.    [provider]  cetirizine (ZYRTEC) 10 MG tablet Take 10 mg by mouth daily.    [provider]  citalopram (CELEXA) 20 MG tablet Take 20 mg by mouth daily.    [provider]  cyclobenzaprine (FLEXERIL) 10 MG tablet Take 1 tablet (10 mg total) by mouth 3 (three) times daily as needed for muscle spasms. 03/06/21   Johnson, Megan P, DO  EPINEPHrine 0.3 mg/0.3 mL IJ SOAJ injection INJ 0.3 ML IM ONCE FOR 1 DOSE 03/04/18   [provider]  fluticasone (FLONASE) 50 MCG/ACT nasal spray INSTILL 2 SPRAYS IN EACH NOSTRIL EVERY DAY 01/16/21   Johnson, Megan P, DO  montelukast (SINGULAIR) 10 MG tablet TAKE 1 TABLET(10 MG) BY MOUTH AT BEDTIME 01/16/21   Johnson, Megan P, DO  naproxen (NAPROSYN) 500 MG tablet Take 1 tablet (500 mg total) by mouth 2 (two) times daily with a meal. 03/06/21   Johnson, Megan P, DO  omeprazole (PRILOSEC) 40 MG capsule Take 1 capsule (40 mg total) by mouth daily. 01/16/21   Park Liter P, DO  Probiotic Product (PROBIOTIC DAILY PO) Take 1 capsule by mouth daily.    [provider]  SUMAtriptan (IMITREX) 100 MG tablet Take 1 tablet (100 mg total) by mouth every 2 (two) hours as needed for migraine. One tab at onset and may repeat x1 in 1 hour. Max 200  mg/24 hours 01/16/21   Johnson, Megan P, DO  umeclidinium bromide (INCRUSE ELLIPTA) 62.5 MCG/INH AEPB Inhale 1 puff into the lungs daily. 01/16/21   Johnson, Megan P, DO  ZOVIA 1/35E, 28, 1-35 MG-MCG tablet Take by mouth daily. 05/03/16   [provider]  ipratropium (ATROVENT) 0.06 % nasal spray Place 2 sprays into both nostrils 4 (four) times daily as needed for rhinitis. 10/15/20 01/11/21  Coral Spikes, DO    Allergies  Allergen Reactions   Hydrocodone Hives and Itching   Tussionex Pennkinetic Er [Hydrocod Polst-Cpm Polst Er] Hives and Itching   Bee Venom Hives    Family History  Problem Relation Age of Onset   Diabetes Mother     Asthma Mother    Diabetes Father    Heart attack Father    Heart disease Father    Asthma Sister    Bipolar disorder Sister    Asthma Brother    Bipolar disorder Brother    Epilepsy Brother     Social History Social History   Tobacco Use   Smoking status: Former    Years: 12.00    Types: Cigarettes    Quit date: 08/24/2012    Years since quitting: 8.7   Smokeless tobacco: Never  Vaping Use   Vaping Use: Never used  Substance Use Topics   Alcohol use: No    Alcohol/week: 0.0 standard drinks   Drug use: No    Review of Systems Constitutional: Negative for fever. Cardiovascular: Negative for chest pain. Respiratory: Negative for shortness of breath. Gastrointestinal: Positive for right flank pain.  Positive for nausea. Genitourinary: Negative for urinary compaints Musculoskeletal: Negative for musculoskeletal complaints Neurological: Negative for headache All other ROS negative  ____________________________________________   PHYSICAL EXAM:  VITAL SIGNS: ED Triage Vitals  Enc Vitals Group     BP 05/23/21 1935 (!) 146/94     Pulse Rate 05/23/21 1935 87     Resp 05/23/21 1935 (!) 22     Temp 05/23/21 1935 98.1 F (36.7 C)     Temp Source 05/23/21 1935 Oral     SpO2 05/23/21 1935 100 %     Weight 05/23/21 1936 210 lb (95.3 kg)     Height 05/23/21 1936 '5\' 7"'$  (1.702 m)     Head Circumference --      Peak Flow --      Pain Score 05/23/21 1936 1     Pain Loc --      Pain Edu? --      Excl. in [place]? --    Constitutional: Alert and oriented. Well appearing and in no distress. Eyes: Normal exam ENT      Head: Normocephalic and atraumatic.      Mouth/Throat: Mucous membranes are moist. Cardiovascular: Normal rate, regular rhythm.  Respiratory: Normal respiratory effort without tachypnea nor retractions. Breath sounds are clear  Gastrointestinal: Soft, mild right mid abdominal tenderness to palpation without rebound guarding or distention. Musculoskeletal:  Nontender with normal range of motion in all extremities. Neurologic:  Normal speech and language. No gross focal neurologic deficits Skin:  Skin is warm, dry and intact.  Psychiatric: Mood and affect are normal.   ____________________________________________    EKG  EKG viewed and interpreted by myself shows a normal sinus rhythm 84 bpm with a narrow QRS, normal axis, normal intervals, no concerning ST changes.  ____________________________________________  INITIAL IMPRESSION / ASSESSMENT AND PLAN / ED COURSE  Pertinent labs & imaging results that were available during my  care of the patient were reviewed by me and considered in my medical decision making (see chart for details).   Patient presents emergency department for right flank pain intermittent over the past few days more constant over the past 2 days.  Patient's labs are reassuring besides a slight leukocytosis of 12,700.  Urinalysis is reassuring.  Given the patient's symptoms we will proceed with CT imaging of the right flank further evaluate.  Differential would include ureterolithiasis, pyelonephritis, colitis or diverticulitis, less likely appendicitis or biliary pathology  CT scan shows a right adnexal cyst possibly hemorrhagic cyst recommend ultrasound.  Ultrasound shows complex cystic lesion on the right ovary.  They do recommend an MRI for further evaluation.  I discussed this with the patient and the need to follow-up with her doctor or OB/GYN for further evaluation to ensure resolution of the cystic lesion within the next several weeks.  Patient agreeable to plan of care.  We will discharge with a short course of pain medication for the patient to take.  Patient agreeable to plan of care.  Reassuringly she has no right lower quadrant abdominal tenderness her tenderness is in the right mid abdomen and reassuring CT otherwise with reassuring lab work.  Ashley Kidd was evaluated in Emergency Department on 05/23/2021 for  the symptoms described in the history of present illness. She was evaluated in the context of the global COVID-19 pandemic, which necessitated consideration that the patient might be at risk for infection with the SARS-CoV-2 virus that causes COVID-19. Institutional protocols and algorithms that pertain to the evaluation of patients at risk for COVID-19 are in a state of rapid change based on information released by regulatory bodies including the CDC and federal and state organizations. These policies and algorithms were followed during the patient's care in the ED.  ____________________________________________   FINAL CLINICAL IMPRESSION(S) / ED DIAGNOSES  Right flank pain   Harvest Dark, MD 05/23/21 2316

## 2021-05-28 ENCOUNTER — Encounter: Payer: Self-pay | Admitting: Family Medicine

## 2021-06-03 ENCOUNTER — Other Ambulatory Visit: Payer: Self-pay

## 2021-06-03 ENCOUNTER — Ambulatory Visit: Payer: Managed Care, Other (non HMO) | Admitting: Family Medicine

## 2021-06-03 ENCOUNTER — Encounter: Payer: Self-pay | Admitting: Family Medicine

## 2021-06-03 VITALS — BP 150/90 | HR 83 | Temp 99.0°F | Ht 67.01 in | Wt 208.0 lb

## 2021-06-03 DIAGNOSIS — R1031 Right lower quadrant pain: Secondary | ICD-10-CM

## 2021-06-03 DIAGNOSIS — N838 Other noninflammatory disorders of ovary, fallopian tube and broad ligament: Secondary | ICD-10-CM

## 2021-06-03 LAB — URINALYSIS, ROUTINE W REFLEX MICROSCOPIC
Bilirubin, UA: NEGATIVE
Glucose, UA: NEGATIVE
Ketones, UA: NEGATIVE
Leukocytes,UA: NEGATIVE
Nitrite, UA: NEGATIVE
Protein,UA: NEGATIVE
RBC, UA: NEGATIVE
Specific Gravity, UA: 1.01 (ref 1.005–1.030)
Urobilinogen, Ur: 0.2 mg/dL (ref 0.2–1.0)
pH, UA: 6 (ref 5.0–7.5)

## 2021-06-03 MED ORDER — TRAMADOL HCL 50 MG PO TABS: 50.0000 mg | ORAL_TABLET | Freq: Three times a day (TID) | ORAL | 0 refills | Status: AC | PRN

## 2021-06-03 MED ORDER — KETOROLAC TROMETHAMINE 60 MG/2ML IM SOLN
60.0000 mg | Freq: Once | INTRAMUSCULAR | Status: AC
  Administered 2021-06-03: 60 mg via INTRAMUSCULAR

## 2021-06-03 NOTE — Progress Notes (Signed)
BP (!) 150/90   Pulse 83   Temp 99 F (37.2 C) (Oral)   Ht 5' 7.01" (1.702 m)   Wt 208 lb (94.3 kg)   LMP 05/16/2021 (Exact Date)   SpO2 100%   BMI 32.57 kg/m    Subjective:    Patient ID: Ashley Kidd, female    DOB: 11-May-1981, 40 y.o.   MRN: [id number]  HPI: Ashley POESCHEL is a 40 y.o. female  Chief Complaint  Patient presents with   ER follow up    Had right flank pain about 2 weeks ago. ER found a cyst on ovary and fibroids.    ER FOLLOW UP Time since discharge: 10 days Hospital/facility: [facility] Diagnosis: Flank Pain, cyst of ovary Procedures/tests:  CLINICAL DATA:  Right flank pain and nausea.   EXAM: CT ABDOMEN AND PELVIS WITHOUT CONTRAST   TECHNIQUE: Multidetector CT imaging of the abdomen and pelvis was performed following the standard protocol without IV contrast.   COMPARISON:  January 07, 2018   FINDINGS: Lower chest: No acute abnormality.   Hepatobiliary: No focal liver abnormality is seen. Status post cholecystectomy. No biliary dilatation.   Pancreas: Unremarkable. No pancreatic ductal dilatation or surrounding inflammatory changes.   Spleen: Normal in size without focal abnormality.   Adrenals/Urinary Tract: Adrenal glands are unremarkable. Kidneys are normal, without renal calculi, focal lesion, or hydronephrosis. Bladder is unremarkable.   Stomach/Bowel: Stomach is within normal limits. Appendix appears normal. No evidence of bowel wall thickening, distention, or inflammatory changes.   Vascular/Lymphatic: No significant vascular findings are present. No enlarged abdominal or pelvic lymph nodes.   Reproductive: Small, exophytic uterine fibroid is seen along the fundus on the right. A 2.8 cm x 2.0 cm well-defined oval-shaped area of increased attenuation is seen within the right adnexa (approximately 57.61 Hounsfield units). Ill-defined bilateral adnexal cysts are also seen.   Other: No abdominal wall hernia or abnormality. No  abdominopelvic ascites.   Musculoskeletal: No acute or significant osseous findings.   IMPRESSION: 1. Findings suspicious for a hemorrhagic cyst within the right adnexa. Correlation with pelvic ultrasound is recommended. 2. Evidence of prior cholecystectomy.  CLINICAL DATA:  Mass on right ovary seen on CT.   EXAM: TRANSABDOMINAL AND TRANSVAGINAL ULTRASOUND OF PELVIS   TECHNIQUE: Both transabdominal and transvaginal ultrasound examinations of the pelvis were performed. Transabdominal technique was performed for global imaging of the pelvis including uterus, ovaries, adnexal regions, and pelvic cul-de-sac. It was necessary to proceed with endovaginal exam following the transabdominal exam to visualize the endometrium and bilateral ovaries.   COMPARISON:  Abdomen pelvis CT, dated May 23, 2021.   FINDINGS: Uterus   Measurements: 7.9 cm x 5.1 cm x 6.5 cm = volume: 135 mL. 3.5 cm x 2.7 cm x 3.6 cm and 3.0 cm x 2.6 cm x 2.9 cm heterogeneous uterine fibroids are seen within the uterine fundus.   Endometrium   Thickness: 6.1 mm.  No focal abnormality visualized.   Right ovary   Measurements: 4.3 cm x 3.0 cm x 2.9 cm = volume: 19 mL. A 2.6 cm x 2.0 cm x 2.5 cm complex partially anechoic and hyperechoic area is seen within the right ovary. No abnormal flow is noted within this region on color Doppler evaluation.   Left ovary   Measurements: 3.9 cm x 2.1 cm x 3.3 cm = volume: 14.5 mL. Normal appearance/no adnexal mass.   Other findings   No abnormal free fluid.   IMPRESSION: 1. Complex, partially cystic  appearing lesion within the right ovary which likely corresponds to the hyperdense lesion seen within the right adnexa on the prior abdomen pelvis CT. Further evaluation with pelvic MRI is recommended. This recommendation follows the consensus statement: Management of Asymptomatic Ovarian and Other Adnexal Cysts Imaged at [place]: Society of Radiologists in [place]. Radiology 2010; [phone]. 2. Multiple heterogeneous uterine fibroids. Consultants: None New medications: None Discharge instructions:  Follow up here Status: worse  Has been doing aleve and ibuprofen. Has been in a lot of pain. Pain is getting worse rather than getting better. It's in her lower R quadrant and into her upper R quadrant. She notes that her belly has been swollen and has been having issues with nausea, but no vomiting. She is seeing her GYN on Thursday. No other concerns or complaints at this time.   Relevant past medical, surgical, family and social history reviewed and updated as indicated. Interim medical history since our last visit reviewed. Allergies and medications reviewed and updated.  Review of Systems  Constitutional: Negative.   Respiratory: Negative.    Cardiovascular: Negative.   Gastrointestinal:  Positive for abdominal distention, abdominal pain and nausea. Negative for anal bleeding, blood in stool, constipation, diarrhea, rectal pain and vomiting.  Genitourinary:  Positive for flank pain and pelvic pain. Negative for decreased urine volume, difficulty urinating, dyspareunia, dysuria, enuresis, frequency, genital sores, hematuria, menstrual problem, urgency, vaginal bleeding, vaginal discharge and vaginal pain.  Skin: Negative.   Psychiatric/Behavioral: Negative.     Per HPI unless specifically indicated above     Objective:    BP (!) 150/90   Pulse 83   Temp 99 F (37.2 C) (Oral)   Ht 5' 7.01" (1.702 m)   Wt 208 lb (94.3 kg)   LMP 05/16/2021 (Exact Date)   SpO2 100%   BMI 32.57 kg/m   Wt Readings from Last 3 Encounters:  06/03/21 208 lb (94.3 kg)  05/23/21 210 lb (95.3 kg)  03/06/21 195 lb (88.5 kg)    Physical Exam Vitals and nursing note reviewed.  Constitutional:      General: She is in acute distress.     Appearance: Normal appearance. She is obese. She is not ill-appearing, toxic-appearing or  diaphoretic.     Comments: Visibly uncomfortable  HENT:     Head: Normocephalic and atraumatic.     Right Ear: External ear normal.     Left Ear: External ear normal.     Nose: Nose normal.     Mouth/Throat:     Mouth: Mucous membranes are moist.     Pharynx: Oropharynx is clear.  Eyes:     General: No scleral icterus.       Right eye: No discharge.        Left eye: No discharge.     Extraocular Movements: Extraocular movements intact.     Conjunctiva/sclera: Conjunctivae normal.     Pupils: Pupils are equal, round, and reactive to light.  Cardiovascular:     Rate and Rhythm: Normal rate and regular rhythm.     Pulses: Normal pulses.     Heart sounds: Normal heart sounds. No murmur heard.   No friction rub. No gallop.  Pulmonary:     Effort: Pulmonary effort is normal. No respiratory distress.     Breath sounds: Normal breath sounds. No stridor. No wheezing, rhonchi or rales.  Chest:     Chest wall: No tenderness.  Musculoskeletal:        General: Normal  range of motion.     Cervical back: Normal range of motion and neck supple.  Skin:    General: Skin is warm and dry.     Capillary Refill: Capillary refill takes less than 2 seconds.     Coloration: Skin is not jaundiced or pale.     Findings: No bruising, erythema, lesion or rash.  Neurological:     General: No focal deficit present.     Mental Status: She is alert and oriented to person, place, and time. Mental status is at baseline.  Psychiatric:        Mood and Affect: Mood normal.        Behavior: Behavior normal.        Thought Content: Thought content normal.        Judgment: Judgment normal.    Results for orders placed or performed during the hospital encounter of 05/23/21  Urinalysis, Complete w Microscopic Urine, Clean Catch  Result Value Ref Range   Color, Urine STRAW (A) YELLOW   APPearance CLEAR (A) CLEAR   Specific Gravity, Urine 1.006 1.005 - 1.030   pH 6.0 5.0 - 8.0   Glucose, UA NEGATIVE NEGATIVE  mg/dL   Hgb urine dipstick NEGATIVE NEGATIVE   Bilirubin Urine NEGATIVE NEGATIVE   Ketones, ur NEGATIVE NEGATIVE mg/dL   Protein, ur NEGATIVE NEGATIVE mg/dL   Nitrite NEGATIVE NEGATIVE   Leukocytes,Ua NEGATIVE NEGATIVE   RBC / HPF 0-5 0 - 5 RBC/hpf   WBC, UA 0-5 0 - 5 WBC/hpf   Bacteria, UA RARE (A) NONE SEEN   Squamous Epithelial / LPF 0-5 0 - 5  CBC  Result Value Ref Range   WBC 12.7 (H) 4.0 - 10.5 K/uL   RBC 4.78 3.87 - 5.11 MIL/uL   Hemoglobin 12.5 12.0 - 15.0 g/dL   HCT 38.1 36.0 - 46.0 %   MCV 79.7 (L) 80.0 - 100.0 fL   MCH 26.2 26.0 - 34.0 pg   MCHC 32.8 30.0 - 36.0 g/dL   RDW 14.4 11.5 - 15.5 %   Platelets 505 (H) 150 - 400 K/uL   nRBC 0.0 0.0 - 0.2 %  Comprehensive metabolic panel  Result Value Ref Range   Sodium 137 135 - 145 mmol/L   Potassium 3.9 3.5 - 5.1 mmol/L   Chloride 104 98 - 111 mmol/L   CO2 24 22 - 32 mmol/L   Glucose, Bld 114 (H) 70 - 99 mg/dL   BUN 7 6 - 20 mg/dL   Creatinine, Ser 0.66 0.44 - 1.00 mg/dL   Calcium 9.3 8.9 - 10.3 mg/dL   Total Protein 7.4 6.5 - 8.1 g/dL   Albumin 4.0 3.5 - 5.0 g/dL   AST 18 15 - 41 U/L   ALT 19 0 - 44 U/L   Alkaline Phosphatase 110 38 - 126 U/L   Total Bilirubin 0.5 0.3 - 1.2 mg/dL   GFR, Estimated >60 >60 mL/min   Anion gap 9 5 - 15  POC urine preg, ED  Result Value Ref Range   Preg Test, Ur NEGATIVE NEGATIVE      Assessment & Plan:   Problem List Items Addressed This Visit   None Visit Diagnoses     RLQ abdominal pain    -  Primary   Likely due to mass. Will check MRI and treat with toradol and tramadol. To see GYN on Thursday- declined [place] trying to move appt up. Await results.    Relevant Medications   ketorolac (TORADOL)  injection 60 mg (Completed) (Start on 06/03/2021 12:00 PM)   Other Relevant Orders   CBC with Differential/Platelet   Urinalysis, Routine w reflex microscopic   MR Pelvis W Wo Contrast   Ovarian mass       Waiting on GYN appt- will check MRI and repeat labs and UA. Await results.     Relevant Orders   MR Pelvis W Wo Contrast        Follow up plan: Return if symptoms worsen or fail to improve.

## 2021-06-04 LAB — CBC WITH DIFFERENTIAL/PLATELET
Basophils Absolute: 0.1 10*3/uL (ref 0.0–0.2)
Basos: 1 %
EOS (ABSOLUTE): 0.1 10*3/uL (ref 0.0–0.4)
Eos: 1 %
Hematocrit: 40.3 % (ref 34.0–46.6)
Hemoglobin: 12.8 g/dL (ref 11.1–15.9)
Immature Grans (Abs): 0.1 10*3/uL (ref 0.0–0.1)
Immature Granulocytes: 1 %
Lymphocytes Absolute: 3.2 10*3/uL — ABNORMAL HIGH (ref 0.7–3.1)
Lymphs: 30 %
MCH: 24.7 pg — ABNORMAL LOW (ref 26.6–33.0)
MCHC: 31.8 g/dL (ref 31.5–35.7)
MCV: 78 fL — ABNORMAL LOW (ref 79–97)
Monocytes Absolute: 0.6 10*3/uL (ref 0.1–0.9)
Monocytes: 6 %
Neutrophils Absolute: 6.7 10*3/uL (ref 1.4–7.0)
Neutrophils: 61 %
Platelets: 604 10*3/uL — ABNORMAL HIGH (ref 150–450)
RBC: 5.18 x10E6/uL (ref 3.77–5.28)
RDW: 14.5 % (ref 11.7–15.4)
WBC: 10.7 10*3/uL (ref 3.4–10.8)

## 2021-06-12 ENCOUNTER — Ambulatory Visit: Payer: Managed Care, Other (non HMO)

## 2021-07-13 ENCOUNTER — Other Ambulatory Visit: Payer: Self-pay | Admitting: Family Medicine

## 2021-07-13 NOTE — Telephone Encounter (Signed)
Last RF 01/16/21   #90 1 RF FVS 07/18/21  Requested Prescriptions  Pending Prescriptions Disp Refills   montelukast (SINGULAIR) 10 MG tablet [Pharmacy Med Name: MONTELUKAST 10MG  TABLETS] 90 tablet 1    Sig: TAKE 1 TABLET(10 MG) BY MOUTH AT BEDTIME     Pulmonology:  Leukotriene Inhibitors Passed - 07/13/2021  8:13 AM      Passed - Valid encounter within last 12 months    Recent Outpatient Visits           1 month ago RLQ abdominal pain   [place], Megan P, DO   4 months ago Low back pain, unspecified back pain laterality, unspecified chronicity, unspecified whether sciatica present   [facility], Megan P, DO   5 months ago Routine general medical examination at a health care facility   [facility], [place] P, DO   12 months ago Type 2 diabetes mellitus without complication, without long-term current use of insulin ([place])   [place], [place] P, DO   1 year ago Type 2 diabetes mellitus without complication, without long-term current use of insulin ([place])   [place], [place], DO       Future Appointments             In 5 days Wynetta Emery, Barb Merino, DO [employer]

## 2021-07-18 ENCOUNTER — Ambulatory Visit: Payer: Managed Care, Other (non HMO) | Admitting: Family Medicine

## 2021-07-27 ENCOUNTER — Telehealth: Payer: Managed Care, Other (non HMO) | Admitting: Emergency Medicine

## 2021-07-27 DIAGNOSIS — R6889 Other general symptoms and signs: Secondary | ICD-10-CM | POA: Diagnosis not present

## 2021-07-27 MED ORDER — BENZONATATE 100 MG PO CAPS: 100.0000 mg | ORAL_CAPSULE | Freq: Two times a day (BID) | ORAL | 0 refills | Status: DC | PRN

## 2021-07-27 MED ORDER — OSELTAMIVIR PHOSPHATE 75 MG PO CAPS: 75.0000 mg | ORAL_CAPSULE | Freq: Two times a day (BID) | ORAL | 0 refills | Status: AC

## 2021-07-27 NOTE — Progress Notes (Signed)
I have spent 5 minutes in review of e-visit questionnaire, review and updating patient chart, medical decision making and response to patient.   Cattaleya Wien, PA-C    

## 2021-07-27 NOTE — Progress Notes (Signed)
E visit for Flu like symptoms   We are sorry that you are not feeling well.  Here is how we plan to help! Based on what you have shared with me it looks like you may have a respiratory virus that may be influenza.  Influenza or "the flu" is   an infection caused by a respiratory virus. The flu virus is highly contagious and persons who did not receive their yearly flu vaccination may "catch" the flu from close contact.  We have anti-viral medications to treat the viruses that cause this infection. They are not a "cure" and only shorten the course of the infection. These prescriptions are most effective when they are given within the first 2 days of "flu" symptoms. Antiviral medication are indicated if you have a high risk of complications from the flu. You should  also consider an antiviral medication if you are in close contact with someone who is at risk. These medications can help patients avoid complications from the flu  but have side effects that you should know. Possible side effects from Tamiflu or oseltamivir include nausea, vomiting, diarrhea, dizziness, headaches, eye redness, sleep problems or other respiratory symptoms. You should not take Tamiflu if you have an allergy to oseltamivir or any to the ingredients in Tamiflu.  Based upon your symptoms and potential risk factors I have prescribed Oseltamivir (Tamiflu).  It has been sent to your designated pharmacy.  You will take one 75 mg capsule orally twice a day for the next 5 days.  I will also prescribe tessalon perles for cough.  If symptoms do not improve over the next day or two, please seek in person evaluation with PCP or at urgent care.   ANYONE WHO HAS FLU SYMPTOMS SHOULD: Stay home. The flu is highly contagious and going out or to work exposes others! Be sure to drink plenty of fluids. Water is fine as well as fruit juices, sodas and electrolyte beverages. You may want to stay away from caffeine or alcohol. If you are nauseated,  try taking small sips of liquids. How do you know if you are getting enough fluid? Your urine should be a pale yellow or almost colorless. Get rest. Taking a steamy shower or using a humidifier may help nasal congestion and ease sore throat pain. Using a saline nasal spray works much the same way. Cough drops, hard candies and sore throat lozenges may ease your cough. Line up a caregiver. Have someone check on you regularly.   GET HELP RIGHT AWAY IF: You cannot keep down liquids or your medications. You become short of breath Your fell like you are going to pass out or loose consciousness. Your symptoms persist after you have completed your treatment plan MAKE SURE YOU  Understand these instructions. Will watch your condition. Will get help right away if you are not doing well or get worse.  Your e-visit answers were reviewed by a board certified advanced clinical practitioner to complete your personal care plan.  Depending on the condition, your plan could have included both over the counter or prescription medications.  If there is a problem please reply  once you have received a response from your provider.  Your safety is important to [place].  If you have drug allergies check your prescription carefully.    You can use MyChart to ask questions about today's visit, request a non-urgent call back, or ask for a work or school excuse for 24 hours related to this e-Visit. If it  has been greater than 24 hours you will need to follow up with your provider, or enter a new e-Visit to address those concerns.  You will get an e-mail in the next two days asking about your experience.  I hope that your e-visit has been valuable and will speed your recovery. Thank you for using e-visits.

## 2021-07-31 ENCOUNTER — Ambulatory Visit: Payer: Managed Care, Other (non HMO) | Admitting: Nurse Practitioner

## 2021-07-31 ENCOUNTER — Ambulatory Visit: Payer: Self-pay

## 2021-07-31 ENCOUNTER — Encounter: Payer: Self-pay | Admitting: Nurse Practitioner

## 2021-07-31 ENCOUNTER — Other Ambulatory Visit: Payer: Self-pay

## 2021-07-31 VITALS — BP 153/96 | HR 91 | Temp 98.4°F | Wt 203.2 lb

## 2021-07-31 DIAGNOSIS — R112 Nausea with vomiting, unspecified: Secondary | ICD-10-CM

## 2021-07-31 MED ORDER — ONDANSETRON 4 MG PO TBDP: 4.0000 mg | ORAL_TABLET | Freq: Three times a day (TID) | ORAL | 0 refills | Status: DC | PRN

## 2021-07-31 NOTE — Telephone Encounter (Signed)
Pt. Reports she was diagnosed with flu 07/27/21 via e-visit. Still having nausea and vomiting. Requesting appointment. Warm transfer to [facility] in the practice for assistance.    Reason for Disposition  [1] MILD or MODERATE vomiting AND [2] present > 48 hours (2 days) (Exception: mild vomiting with associated diarrhea)  Answer Assessment - Initial Assessment Questions 1. VOMITING SEVERITY: "How many times have you vomited in the past 24 hours?"     - MILD:  1 - 2 times/day    - MODERATE: 3 - 5 times/day, decreased oral intake without significant weight loss or symptoms of dehydration    - SEVERE: 6 or more times/day, vomits everything or nearly everything, with significant weight loss, symptoms of dehydration      4 2. ONSET: "When did the vomiting begin?"      This week 3. FLUIDS: "What fluids or food have you vomited up today?" "Have you been able to keep any fluids down?"     Yes 4. ABDOMINAL PAIN: "Are your having any abdominal pain?" If yes : "How bad is it and what does it feel like?" (e.g., crampy, dull, intermittent, constant)      Cramping 5. DIARRHEA: "Is there any diarrhea?" If Yes, ask: "How many times today?"      No 6. CONTACTS: "Is there anyone else in the family with the same symptoms?"      No 7. CAUSE: "What do you think is causing your vomiting?"     Flu 8. HYDRATION STATUS: "Any signs of dehydration?" (e.g., dry mouth [not only dry lips], too weak to stand) "When did you last urinate?"     No 9. OTHER SYMPTOMS: "Do you have any other symptoms?" (e.g., fever, headache, vertigo, vomiting blood or coffee grounds, recent head injury)     Nausea 10. PREGNANCY: "Is there any chance you are pregnant?" "When was your last menstrual period?"       No  Protocols used: Vomiting-A-AH

## 2021-07-31 NOTE — Patient Instructions (Signed)
Sip on water and gatorade zero. Eat small amounts of bland foods, crackers, toast, bananas, applesauce  Take zofran every 8 hours for 2 days, and then as needed

## 2021-07-31 NOTE — Progress Notes (Signed)
Acute Office Visit  Subjective:    Patient ID: Ashley Kidd, female    DOB: 1981-01-18, 40 y.o.   MRN: [id number]  Chief Complaint  Patient presents with   Cough   Nasal Congestion   Diarrhea   Emesis   Fever   bodyaches    Started Saturday 10/29. Did an E-vist and was diagnosed with the flu. No at home covid. Non productive. Was prescribed tamiflu Sunday.    HPI Patient is in today for nasal and chest congestion since Saturday. Her friend recently had RSV. Sunday, symptoms worse and had chills. Treated with tamiflu and tessalon perles. Congestion, cough has improved. Now experiencing hot/cold fashes, nausea and vomiting after eating anything. Phenergan made stomach hurt worse.   UPPER RESPIRATORY TRACT INFECTION  Worst symptom: nausea/vomiting Fever: yes Cough: no Shortness of breath: no Wheezing: no Chest pain: no Chest tightness: no Chest congestion: no Nasal congestion: no Runny nose: no Post nasal drip: yes Sneezing: no Sore throat: no Swollen glands: no Sinus pressure: no Headache: no Face pain: no Toothache: no Ear pain: no  Ear pressure: no  Eyes red/itching:no Eye drainage/crusting: no  Vomiting: yes Rash: no Fatigue: yes Sick contacts: yes Strep contacts: no  Context: fluctuating Recurrent sinusitis: no Relief with OTC cold/cough medications: yes  Treatments attempted: mucinex, peptobismol, immdodium     Past Medical History:  Diagnosis Date   Allergy    Asthma    GERD (gastroesophageal reflux disease)    Migraine    PCOS (polycystic ovarian syndrome)    PCOS (polycystic ovarian syndrome)    Sinus complaint    Type 2 diabetes mellitus without complication, without long-term current use of insulin ([place]) 07/26/2019    Past Surgical History:  Procedure Laterality Date   CHOLECYSTECTOMY  06-09-13   OVARIAN CYST REMOVAL Bilateral 2002   UPPER GI ENDOSCOPY  11-29-12   Dr Candace Cruise    Family History  Problem Relation Age of Onset   Diabetes  Mother    Asthma Mother    Diabetes Father    Heart attack Father    Heart disease Father    Asthma Sister    Bipolar disorder Sister    Asthma Brother    Bipolar disorder Brother    Epilepsy Brother     Social History   Socioeconomic History   Marital status: Married    Spouse name: Not on file   Number of children: Not on file   Years of education: Not on file   Highest education level: Not on file  Occupational History   Not on file  Tobacco Use   Smoking status: Former    Years: 12.00    Types: Cigarettes    Quit date: 08/24/2012    Years since quitting: 8.9   Smokeless tobacco: Never  Vaping Use   Vaping Use: Never used  Substance and Sexual Activity   Alcohol use: No    Alcohol/week: 0.0 standard drinks   Drug use: No   Sexual activity: Yes  Other Topics Concern   Not on file  Social History Narrative   Not on file   Social Determinants of Health   Financial Resource Strain: Not on file  Food Insecurity: Not on file  Transportation Needs: Not on file  Physical Activity: Not on file  Stress: Not on file  Social Connections: Not on file  Intimate Partner Violence: Not on file    Outpatient Medications Prior to Visit  Medication Sig Dispense Refill  albuterol (VENTOLIN HFA) 108 (90 Base) MCG/ACT inhaler INHALE 2 PUFFS INTO THE LUNGS EVERY 6 HOURS AS NEEDED FOR WHEEZING 18 g 6   aspirin 81 MG tablet Take 81 mg by mouth daily.     cetirizine (ZYRTEC) 10 MG tablet Take 10 mg by mouth daily.     citalopram (CELEXA) 20 MG tablet Take 20 mg by mouth daily.     EPINEPHrine 0.3 mg/0.3 mL IJ SOAJ injection INJ 0.3 ML IM ONCE FOR 1 DOSE  1   fluticasone (FLONASE) 50 MCG/ACT nasal spray INSTILL 2 SPRAYS IN EACH NOSTRIL EVERY DAY 16 g 12   montelukast (SINGULAIR) 10 MG tablet TAKE 1 TABLET(10 MG) BY MOUTH AT BEDTIME 90 tablet 1   naproxen (NAPROSYN) 500 MG tablet Take 1 tablet (500 mg total) by mouth 2 (two) times daily with a meal. 30 tablet 0   omeprazole  (PRILOSEC) 40 MG capsule Take 1 capsule (40 mg total) by mouth daily. 90 capsule 1   oseltamivir (TAMIFLU) 75 MG capsule Take 1 capsule (75 mg total) by mouth 2 (two) times daily for 5 days. 10 capsule 0   Probiotic Product (PROBIOTIC DAILY PO) Take 1 capsule by mouth daily.     SUMAtriptan (IMITREX) 100 MG tablet Take 1 tablet (100 mg total) by mouth every 2 (two) hours as needed for migraine. One tab at onset and may repeat x1 in 1 hour. Max 200 mg/24 hours 10 tablet 12   umeclidinium bromide (INCRUSE ELLIPTA) 62.5 MCG/INH AEPB Inhale 1 puff into the lungs daily. 1 each 6   ZOVIA 1/35E, 28, 1-35 MG-MCG tablet Take by mouth daily.     benzonatate (TESSALON) 100 MG capsule Take 1 capsule (100 mg total) by mouth 2 (two) times daily as needed for cough. 20 capsule 0   promethazine (PHENERGAN) 25 MG tablet Take 1 tablet (25 mg total) by mouth every 6 (six) hours as needed for nausea or vomiting. (Patient not taking: Reported on 07/31/2021) 20 tablet 0   No facility-administered medications prior to visit.    Allergies  Allergen Reactions   Hydrocodone Hives and Itching   Tussionex Pennkinetic Er [Hydrocod Polst-Cpm Polst Er] Hives and Itching   Bee Venom Hives    Review of Systems  Constitutional:  Positive for chills, fatigue and fever.  HENT: Negative.    Respiratory: Negative.    Cardiovascular: Negative.   Gastrointestinal:  Positive for abdominal pain (cramping), nausea and vomiting. Negative for constipation and diarrhea.  Genitourinary: Negative.   Musculoskeletal:  Positive for myalgias.  Skin: Negative.   Neurological: Negative.       Objective:    Physical Exam Vitals and nursing note reviewed.  Constitutional:      General: She is not in acute distress.    Appearance: Normal appearance.  HENT:     Head: Normocephalic.     Right Ear: Tympanic membrane, ear canal and external ear normal.     Left Ear: Tympanic membrane, ear canal and external ear normal.  Eyes:      Conjunctiva/sclera: Conjunctivae normal.  Cardiovascular:     Rate and Rhythm: Normal rate and regular rhythm.     Pulses: Normal pulses.     Heart sounds: Normal heart sounds.  Pulmonary:     Effort: Pulmonary effort is normal.     Breath sounds: Normal breath sounds.  Abdominal:     General: There is no distension.     Palpations: Abdomen is soft.     Tenderness:  There is no abdominal tenderness. There is no guarding or rebound.  Musculoskeletal:     Cervical back: Normal range of motion.  Skin:    General: Skin is warm.  Neurological:     General: No focal deficit present.     Mental Status: She is alert and oriented to person, place, and time.  Psychiatric:        Mood and Affect: Mood normal.        Behavior: Behavior normal.        Thought Content: Thought content normal.        Judgment: Judgment normal.    BP (!) 153/96   Pulse 91   Temp 98.4 F (36.9 C) (Oral)   Wt 203 lb 4 oz (92.2 kg)   SpO2 98%   BMI 31.83 kg/m  Wt Readings from Last 3 Encounters:  07/31/21 203 lb 4 oz (92.2 kg)  06/03/21 208 lb (94.3 kg)  05/23/21 210 lb (95.3 kg)    Health Maintenance Due  Topic Date Due   Pneumococcal Vaccine 37-57 Years old (2 - PCV) 08/04/2015   COVID-19 Vaccine (3 - Pfizer risk series) 08/02/2020   OPHTHALMOLOGY EXAM  11/30/2020   INFLUENZA VACCINE  04/28/2021   HEMOGLOBIN A1C  07/18/2021    There are no preventive care reminders to display for this patient.   Lab Results  Component Value Date   TSH 1.760 01/16/2021   Lab Results  Component Value Date   WBC 10.7 06/03/2021   HGB 12.8 06/03/2021   HCT 40.3 06/03/2021   MCV 78 (L) 06/03/2021   PLT 604 (H) 06/03/2021   Lab Results  Component Value Date   NA 137 05/23/2021   K 3.9 05/23/2021   CO2 24 05/23/2021   GLUCOSE 114 (H) 05/23/2021   BUN 7 05/23/2021   CREATININE 0.66 05/23/2021   BILITOT 0.5 05/23/2021   ALKPHOS 110 05/23/2021   AST 18 05/23/2021   ALT 19 05/23/2021   PROT 7.4  05/23/2021   ALBUMIN 4.0 05/23/2021   CALCIUM 9.3 05/23/2021   ANIONGAP 9 05/23/2021   EGFR 114 01/16/2021   Lab Results  Component Value Date   CHOL 256 (H) 01/16/2021   Lab Results  Component Value Date   HDL 49 01/16/2021   Lab Results  Component Value Date   LDLCALC 153 (H) 01/16/2021   Lab Results  Component Value Date   TRIG 295 (H) 01/16/2021   No results found for: CHOLHDL Lab Results  Component Value Date   HGBA1C 6.5 01/16/2021       Assessment & Plan:   Problem List Items Addressed This Visit   None Visit Diagnoses     Nausea and vomiting, unspecified vomiting type    -  Primary   Recently diagnosed with flu. Most symptoms resolving. Ongoing nausea/vomiting. Start zofran prn. Discussed bland foods/small sips/bites. F/U if no improvement        Meds ordered this encounter  Medications   ondansetron (ZOFRAN ODT) 4 MG disintegrating tablet    Sig: Take 1 tablet (4 mg total) by mouth every 8 (eight) hours as needed for nausea or vomiting.    Dispense:  20 tablet    Refill:  0     Charyl Dancer, NP

## 2021-09-17 ENCOUNTER — Telehealth: Payer: Managed Care, Other (non HMO) | Admitting: Physician Assistant

## 2021-09-17 DIAGNOSIS — J019 Acute sinusitis, unspecified: Secondary | ICD-10-CM

## 2021-09-17 DIAGNOSIS — B9689 Other specified bacterial agents as the cause of diseases classified elsewhere: Secondary | ICD-10-CM | POA: Diagnosis not present

## 2021-09-17 MED ORDER — DOXYCYCLINE HYCLATE 100 MG PO CAPS: 100.0000 mg | ORAL_CAPSULE | Freq: Two times a day (BID) | ORAL | 0 refills | Status: DC

## 2021-09-17 NOTE — Progress Notes (Signed)
I have spent 5 minutes in review of e-visit questionnaire, review and updating patient chart, medical decision making and response to patient.   Jaimee Corum Cody Alyha Marines, PA-C    

## 2021-09-17 NOTE — Progress Notes (Signed)

## 2021-09-29 ENCOUNTER — Ambulatory Visit: Admit: 2021-09-29 | Payer: Managed Care, Other (non HMO)

## 2021-09-29 ENCOUNTER — Ambulatory Visit
Admission: EM | Admit: 2021-09-29 | Discharge: 2021-09-29 | Disposition: A | Discharge status: Home / Self Care | Payer: 59 | Attending: Physician Assistant | Admitting: Physician Assistant

## 2021-09-29 ENCOUNTER — Other Ambulatory Visit: Payer: Self-pay

## 2021-09-29 DIAGNOSIS — R051 Acute cough: Secondary | ICD-10-CM

## 2021-09-29 DIAGNOSIS — R112 Nausea with vomiting, unspecified: Secondary | ICD-10-CM | POA: Diagnosis not present

## 2021-09-29 DIAGNOSIS — J209 Acute bronchitis, unspecified: Secondary | ICD-10-CM

## 2021-09-29 DIAGNOSIS — J45901 Unspecified asthma with (acute) exacerbation: Secondary | ICD-10-CM

## 2021-09-29 MED ORDER — PSEUDOEPH-BROMPHEN-DM 30-2-10 MG/5ML PO SYRP: 10.0000 mL | ORAL_SOLUTION | Freq: Four times a day (QID) | ORAL | 0 refills | Status: AC | PRN

## 2021-09-29 MED ORDER — ONDANSETRON 8 MG PO TBDP: 8.0000 mg | ORAL_TABLET | Freq: Three times a day (TID) | ORAL | 0 refills | Status: AC | PRN

## 2021-09-29 MED ORDER — PREDNISONE 20 MG PO TABS: 40.0000 mg | ORAL_TABLET | Freq: Every day | ORAL | 0 refills | Status: AC

## 2021-09-29 NOTE — ED Provider Notes (Signed)
[facility]    CSN: [id number] Arrival date & time: 09/29/21  1704      History   Chief Complaint Chief Complaint  Patient presents with   Cough   Nausea    HPI Ashley Kidd is a 41 y.o. female presenting for 1 month history of cough and nasal congestion.  Patient reports that she was recently treated with doxycycline for acute sinusitis and took 5 to 7 days.  Recently developed nausea with vomiting and diarrhea over the past couple of days.  Unable to finish the last 2 days of the doxycycline.  Patient reports that her sinus pain and nasal congestion seem to have cleared up and now she feels a lot of congestion in her chest.  Reports increased breathing difficulty.  Has a history of asthma and has been using her inhaler more often than normal.  No fevers.  Exposed to [place] around Christmas.  Patient has been taking over-the-counter cough medicine but says it has not helped.  History of allergies, asthma, PCOS, migraines, type 2 diabetes with use of insulin.  HPI  Past Medical History:  Diagnosis Date   Allergy    Asthma    GERD (gastroesophageal reflux disease)    Migraine    PCOS (polycystic ovarian syndrome)    PCOS (polycystic ovarian syndrome)    Sinus complaint    Type 2 diabetes mellitus without complication, without long-term current use of insulin ([place]) 07/26/2019    Patient Active Problem List   Diagnosis Date Noted   Type 2 diabetes mellitus without complication, without long-term current use of insulin ([place]) 07/26/2019   Moderate persistent asthma without complication 24/40/1027   C. difficile diarrhea 12/24/2015   Migraine    Allergy    Low back pain 03/26/2015   Chronic cholecystitis without calculus 05/19/2013    Past Surgical History:  Procedure Laterality Date   CHOLECYSTECTOMY  06-09-13   OVARIAN CYST REMOVAL Bilateral 2002   UPPER GI ENDOSCOPY  11-29-12   Dr Candace Cruise    OB History     Gravida  0   Para  0   Term  0   Preterm  0    AB  0   Living  0      SAB  0   IAB  0   Ectopic  0   Multiple  0   Live Births           Obstetric Comments  1st Menstrual Cycle: 13 1st Pregnancy:  NA          Home Medications    Prior to Admission medications   Medication Sig Start Date End Date Taking? Authorizing Provider  albuterol (VENTOLIN HFA) 108 (90 Base) MCG/ACT inhaler INHALE 2 PUFFS INTO THE LUNGS EVERY 6 HOURS AS NEEDED FOR WHEEZING 01/16/21  Yes Johnson, Megan P, DO  aspirin 81 MG tablet Take 81 mg by mouth daily.   Yes [provider]  brompheniramine-pseudoephedrine-DM 30-2-10 MG/5ML syrup Take 10 mLs by mouth 4 (four) times daily as needed for up to 7 days. 09/29/21 10/06/21 Yes Danton Clap, PA-C  cetirizine (ZYRTEC) 10 MG tablet Take 10 mg by mouth daily.   Yes [provider]  citalopram (CELEXA) 20 MG tablet Take 20 mg by mouth daily.   Yes [provider]  doxycycline (VIBRAMYCIN) 100 MG capsule Take 1 capsule (100 mg total) by mouth 2 (two) times daily. 09/17/21  Yes Brunetta Jeans, PA-C  EPINEPHrine 0.3 mg/0.3  mL IJ SOAJ injection INJ 0.3 ML IM ONCE FOR 1 DOSE 03/04/18  Yes [provider]  fluticasone (FLONASE) 50 MCG/ACT nasal spray INSTILL 2 SPRAYS IN EACH NOSTRIL EVERY DAY 01/16/21  Yes Johnson, Megan P, DO  montelukast (SINGULAIR) 10 MG tablet TAKE 1 TABLET(10 MG) BY MOUTH AT BEDTIME 07/13/21  Yes Johnson, Megan P, DO  naproxen (NAPROSYN) 500 MG tablet Take 1 tablet (500 mg total) by mouth 2 (two) times daily with a meal. 03/06/21  Yes Johnson, Megan P, DO  omeprazole (PRILOSEC) 40 MG capsule Take 1 capsule (40 mg total) by mouth daily. 01/16/21  Yes Johnson, Megan P, DO  ondansetron (ZOFRAN-ODT) 8 MG disintegrating tablet Take 1 tablet (8 mg total) by mouth every 8 (eight) hours as needed for up to 5 days for nausea or vomiting. 09/29/21 10/04/21 Yes Danton Clap, PA-C  predniSONE (DELTASONE) 20 MG tablet Take 2 tablets (40 mg total) by mouth daily for 5 days.  09/29/21 10/04/21 Yes Laurene Footman B, PA-C  Probiotic Product (PROBIOTIC DAILY PO) Take 1 capsule by mouth daily.   Yes [provider]  promethazine (PHENERGAN) 25 MG tablet Take 1 tablet (25 mg total) by mouth every 6 (six) hours as needed for nausea or vomiting. 05/23/21  Yes Harvest Dark, MD  SUMAtriptan (IMITREX) 100 MG tablet Take 1 tablet (100 mg total) by mouth every 2 (two) hours as needed for migraine. One tab at onset and may repeat x1 in 1 hour. Max 200 mg/24 hours 01/16/21  Yes Johnson, Megan P, DO  umeclidinium bromide (INCRUSE ELLIPTA) 62.5 MCG/INH AEPB Inhale 1 puff into the lungs daily. 01/16/21  Yes Johnson, Megan P, DO  ZOVIA 1/35E, 28, 1-35 MG-MCG tablet Take by mouth daily. 05/03/16  Yes [provider]  benzonatate (TESSALON) 100 MG capsule Take 1 capsule (100 mg total) by mouth 2 (two) times daily as needed for cough. 07/27/21   Wurst, [place], PA-C  ipratropium (ATROVENT) 0.06 % nasal spray Place 2 sprays into both nostrils 4 (four) times daily as needed for rhinitis. 10/15/20 01/11/21  Coral Spikes, DO    Family History Family History  Problem Relation Age of Onset   Diabetes Mother    Asthma Mother    Diabetes Father    Heart attack Father    Heart disease Father    Asthma Sister    Bipolar disorder Sister    Asthma Brother    Bipolar disorder Brother    Epilepsy Brother     Social History Social History   Tobacco Use   Smoking status: Former    Years: 12.00    Types: Cigarettes    Quit date: 08/24/2012    Years since quitting: 9.1   Smokeless tobacco: Never  Vaping Use   Vaping Use: Never used  Substance Use Topics   Alcohol use: No    Alcohol/week: 0.0 standard drinks   Drug use: No     Allergies   Hydrocodone, Tussionex pennkinetic er [hydrocod polst-cpm polst er], and Bee venom   Review of Systems Review of Systems  Constitutional:  Positive for fatigue. Negative for chills, diaphoresis and fever.  HENT:  Positive for  congestion and rhinorrhea. Negative for ear pain, sinus pressure, sinus pain and sore throat.   Respiratory:  Positive for cough. Negative for shortness of breath.   Gastrointestinal:  Positive for diarrhea, nausea and vomiting. Negative for abdominal pain.  Musculoskeletal:  Negative for arthralgias and myalgias.  Skin:  Negative for rash.  Neurological:  Negative for weakness and headaches.  Hematological:  Negative for adenopathy.  Psychiatric/Behavioral:  Positive for sleep disturbance.     Physical Exam Triage Vital Signs ED Triage Vitals  Enc Vitals Group     BP 09/29/21 1926 139/90     Pulse Rate 09/29/21 1926 89     Resp 09/29/21 1926 18     Temp 09/29/21 1926 99.1 F (37.3 C)     Temp Source 09/29/21 1926 Oral     SpO2 09/29/21 1926 98 %     Weight 09/29/21 1924 205 lb (93 kg)     Height 09/29/21 1924 5\' 7"  (1.702 m)     Head Circumference --      Peak Flow --      Pain Score 09/29/21 1924 2     Pain Loc --      Pain Edu? --      Excl. in [place]? --    No data found.  Updated Vital Signs BP 139/90 (BP Location: Left Arm)    Pulse 89    Temp 99.1 F (37.3 C) (Oral)    Resp 18    Ht 5\' 7"  (1.702 m)    Wt 205 lb (93 kg)    LMP 08/29/2021    SpO2 98%    BMI 32.11 kg/m     Physical Exam Vitals and nursing note reviewed.  Constitutional:      General: She is not in acute distress.    Appearance: Normal appearance. She is ill-appearing. She is not toxic-appearing.  HENT:     Head: Normocephalic and atraumatic.     Nose: Congestion present.     Mouth/Throat:     Mouth: Mucous membranes are moist.     Pharynx: Oropharynx is clear.  Eyes:     General: No scleral icterus.       Right eye: No discharge.        Left eye: No discharge.     Conjunctiva/sclera: Conjunctivae normal.  Cardiovascular:     Rate and Rhythm: Normal rate and regular rhythm.     Heart sounds: Normal heart sounds.  Pulmonary:     Effort: Pulmonary effort is normal. No respiratory distress.      Breath sounds: No wheezing, rhonchi or rales.  Musculoskeletal:     Cervical back: Neck supple.  Skin:    General: Skin is dry.  Neurological:     General: No focal deficit present.     Mental Status: She is alert. Mental status is at baseline.     Motor: No weakness.     Gait: Gait normal.  Psychiatric:        Mood and Affect: Mood normal.        Behavior: Behavior normal.        Thought Content: Thought content normal.     UC Treatments / Results  Labs (all labs ordered are listed, but only abnormal results are displayed) Labs Reviewed - No data to display  EKG   Radiology No results found.  Procedures Procedures (including critical care time)  Medications Ordered in UC Medications - No data to display  Initial Impression / Assessment and Plan / UC Course  I have reviewed the triage vital signs and the nursing notes.  Pertinent labs & imaging results that were available during my care of the patient were reviewed by me and considered in my medical decision making (see chart for details).  41 year old female presenting for 1 month history  of cough and congestion.  Has been treated with doxycycline for 5 to 7 days.  Stopped it after developing nausea, vomiting and diarrhea.  Vitals are normal and stable.  Patient ill-appearing but nontoxic.  Nasal congestion on exam but no drainage.  Chest clear to auscultation heart regular rate and rhythm.  Advised patient symptoms consistent with bronchitis, suspect viral bronchitis and flareup of her asthma.  Do not want to prescribe her more antibiotics at the time especially since she has N/V/D.  History of C. difficile.  Advised her to increase her rest and fluids.  Sent prednisone.  Patient is insulin-dependent diabetic.  Knows to keep a check on her blood sugars while taking prednisone as she has had it before.  Sent Bromfed-DM to pharmacy.  Sent Zofran to pharmacy.  Encouraged her to follow-up with her PCP if not improving over  the next 1 to 2 weeks or if symptoms worsen.  ED precautions reviewed.   Final Clinical Impressions(s) / UC Diagnoses   Final diagnoses:  Acute bronchitis, unspecified organism  Acute cough  Asthma with acute exacerbation, unspecified asthma severity, unspecified whether persistent  Nausea and vomiting, unspecified vomiting type     Discharge Instructions      -You have bronchitis.  I have sent prednisone to the pharmacy as well as a cough medication.  I have sent in nausea medication 2.  Consider over-the-counter Imodium.  Increase rest and fluids.  Consider Pedialyte or Gatorade to increase your electrolytes. - Use your inhalers and breathing treatments at home. - If not feeling better in the next week or if symptoms worsen you need to be seen again.  Please follow-up with PCP. - Go to ER for any severe acute worsening of her symptoms, especially breathing.     ED Prescriptions     Medication Sig Dispense Auth. Provider   brompheniramine-pseudoephedrine-DM 30-2-10 MG/5ML syrup Take 10 mLs by mouth 4 (four) times daily as needed for up to 7 days. 150 mL Laurene Footman B, PA-C   predniSONE (DELTASONE) 20 MG tablet Take 2 tablets (40 mg total) by mouth daily for 5 days. 10 tablet Laurene Footman B, PA-C   ondansetron (ZOFRAN-ODT) 8 MG disintegrating tablet Take 1 tablet (8 mg total) by mouth every 8 (eight) hours as needed for up to 5 days for nausea or vomiting. 15 tablet Gretta Cool      PDMP not reviewed this encounter.   Danton Clap, PA-C 09/29/21 2011

## 2021-09-29 NOTE — ED Triage Notes (Signed)
Pt c/o worsening sinus pressure, cough, nausea, vomiting, diarrhea x17month. Pt has not had any covid or flu testing.

## 2021-09-29 NOTE — Discharge Instructions (Signed)
-  You have bronchitis.  I have sent prednisone to the pharmacy as well as a cough medication.  I have sent in nausea medication 2.  Consider over-the-counter Imodium.  Increase rest and fluids.  Consider Pedialyte or Gatorade to increase your electrolytes. - Use your inhalers and breathing treatments at home. - If not feeling better in the next week or if symptoms worsen you need to be seen again.  Please follow-up with PCP. - Go to ER for any severe acute worsening of her symptoms, especially breathing.

## 2021-10-14 ENCOUNTER — Ambulatory Visit: Payer: 59 | Admitting: Internal Medicine

## 2021-10-14 ENCOUNTER — Other Ambulatory Visit: Payer: Self-pay

## 2021-10-14 ENCOUNTER — Ambulatory Visit
Admission: RE | Admit: 2021-10-14 | Discharge: 2021-10-14 | Disposition: A | Discharge status: Home / Self Care | Payer: 59 | Attending: Internal Medicine | Admitting: Internal Medicine

## 2021-10-14 ENCOUNTER — Encounter: Payer: Self-pay | Admitting: Internal Medicine

## 2021-10-14 ENCOUNTER — Ambulatory Visit
Admission: RE | Admit: 2021-10-14 | Discharge: 2021-10-14 | Disposition: A | Discharge status: Home / Self Care | Payer: 59 | Source: Ambulatory Visit | Attending: Internal Medicine | Admitting: Internal Medicine

## 2021-10-14 VITALS — BP 127/85 | HR 98 | Temp 98.8°F | Ht 66.14 in | Wt 202.0 lb

## 2021-10-14 DIAGNOSIS — M25561 Pain in right knee: Secondary | ICD-10-CM | POA: Insufficient documentation

## 2021-10-14 MED ORDER — CYCLOBENZAPRINE HCL 5 MG PO TABS: 5.0000 mg | ORAL_TABLET | Freq: Every day | ORAL | 0 refills | Status: DC

## 2021-10-14 NOTE — Progress Notes (Signed)
BP 127/85    Pulse 98    Temp 98.8 F (37.1 C) (Oral)    Ht 5' 6.14" (1.68 m)    Wt 202 lb (91.6 kg)    SpO2 98%    BMI 32.46 kg/m    Subjective:    Patient ID: Ashley Kidd, female    DOB: 11-07-1980, 41 y.o.   MRN: [id number]  Chief Complaint  Patient presents with   Right Knee Pain    Hurt right knee this morning, Patient states the pain in her knee is on the back side and medial side of knee, painful and burning sensation. Patient also states that her knee keeps giving out on her, has been trying to keep elevated today with Ice.     HPI: Ashley Kidd is a 41 y.o. female  Patient presents with: Right Knee Pain: Hurt right knee this morning, Patient states the pain in her knee is on the back side and medial side of knee, painful and burning sensation. Patient also states that her knee keeps giving out on her, has been trying to keep elevated today with Ice.     Knee Pain  The injury mechanism was a twisting injury. The quality of the pain is described as cramping and burning. The pain is at a severity of 6/10. Associated symptoms include an inability to bear weight. Pertinent negatives include no loss of motion, loss of sensation, muscle weakness, numbness or tingling. She has tried NSAIDs (ice and sleeve for such) for the symptoms.   Chief Complaint  Patient presents with   Right Knee Pain    Hurt right knee this morning, Patient states the pain in her knee is on the back side and medial side of knee, painful and burning sensation. Patient also states that her knee keeps giving out on her, has been trying to keep elevated today with Ice.     Relevant past medical, surgical, family and social history reviewed and updated as indicated. Interim medical history since our last visit reviewed. Allergies and medications reviewed and updated.  Review of Systems  Neurological:  Negative for tingling and numbness.   Per HPI unless specifically indicated above     Objective:     BP 127/85    Pulse 98    Temp 98.8 F (37.1 C) (Oral)    Ht 5' 6.14" (1.68 m)    Wt 202 lb (91.6 kg)    SpO2 98%    BMI 32.46 kg/m   Wt Readings from Last 3 Encounters:  10/14/21 202 lb (91.6 kg)  09/29/21 205 lb (93 kg)  07/31/21 203 lb 4 oz (92.2 kg)    Physical Exam  Results for orders placed or performed in visit on 06/03/21  CBC with Differential/Platelet  Result Value Ref Range   WBC 10.7 3.4 - 10.8 x10E3/uL   RBC 5.18 3.77 - 5.28 x10E6/uL   Hemoglobin 12.8 11.1 - 15.9 g/dL   Hematocrit 40.3 34.0 - 46.6 %   MCV 78 (L) 79 - 97 fL   MCH 24.7 (L) 26.6 - 33.0 pg   MCHC 31.8 31.5 - 35.7 g/dL   RDW 14.5 11.7 - 15.4 %   Platelets 604 (H) 150 - 450 x10E3/uL   Neutrophils 61 Not Estab. %   Lymphs 30 Not Estab. %   Monocytes 6 Not Estab. %   Eos 1 Not Estab. %   Basos 1 Not Estab. %   Neutrophils Absolute 6.7 1.4 - 7.0 x10E3/uL  Lymphocytes Absolute 3.2 (H) 0.7 - 3.1 x10E3/uL   Monocytes Absolute 0.6 0.1 - 0.9 x10E3/uL   EOS (ABSOLUTE) 0.1 0.0 - 0.4 x10E3/uL   Basophils Absolute 0.1 0.0 - 0.2 x10E3/uL   Immature Granulocytes 1 Not Estab. %   Immature Grans (Abs) 0.1 0.0 - 0.1 x10E3/uL  Urinalysis, Routine w reflex microscopic  Result Value Ref Range   Specific Gravity, UA 1.010 1.005 - 1.030   pH, UA 6.0 5.0 - 7.5   Color, UA Yellow Yellow   Appearance Ur Clear Clear   Leukocytes,UA Negative Negative   Protein,UA Negative Negative/Trace   Glucose, UA Negative Negative   Ketones, UA Negative Negative   RBC, UA Negative Negative   Bilirubin, UA Negative Negative   Urobilinogen, Ur 0.2 0.2 - 1.0 mg/dL   Nitrite, UA Negative Negative        Current Outpatient Medications:    albuterol (VENTOLIN HFA) 108 (90 Base) MCG/ACT inhaler, INHALE 2 PUFFS INTO THE LUNGS EVERY 6 HOURS AS NEEDED FOR WHEEZING, Disp: 18 g, Rfl: 6   aspirin 81 MG tablet, Take 81 mg by mouth daily., Disp: , Rfl:    cetirizine (ZYRTEC) 10 MG tablet, Take 10 mg by mouth daily., Disp: , Rfl:     citalopram (CELEXA) 20 MG tablet, Take 20 mg by mouth daily., Disp: , Rfl:    EPINEPHrine 0.3 mg/0.3 mL IJ SOAJ injection, INJ 0.3 ML IM ONCE FOR 1 DOSE, Disp: , Rfl: 1   fluticasone (FLONASE) 50 MCG/ACT nasal spray, INSTILL 2 SPRAYS IN EACH NOSTRIL EVERY DAY, Disp: 16 g, Rfl: 12   montelukast (SINGULAIR) 10 MG tablet, TAKE 1 TABLET(10 MG) BY MOUTH AT BEDTIME, Disp: 90 tablet, Rfl: 1   naproxen (NAPROSYN) 500 MG tablet, Take 1 tablet (500 mg total) by mouth 2 (two) times daily with a meal., Disp: 30 tablet, Rfl: 0   omeprazole (PRILOSEC) 40 MG capsule, Take 1 capsule (40 mg total) by mouth daily., Disp: 90 capsule, Rfl: 1   Probiotic Product (PROBIOTIC DAILY PO), Take 1 capsule by mouth daily., Disp: , Rfl:    promethazine (PHENERGAN) 25 MG tablet, Take 1 tablet (25 mg total) by mouth every 6 (six) hours as needed for nausea or vomiting., Disp: 20 tablet, Rfl: 0   SUMAtriptan (IMITREX) 100 MG tablet, Take 1 tablet (100 mg total) by mouth every 2 (two) hours as needed for migraine. One tab at onset and may repeat x1 in 1 hour. Max 200 mg/24 hours, Disp: 10 tablet, Rfl: 12   umeclidinium bromide (INCRUSE ELLIPTA) 62.5 MCG/INH AEPB, Inhale 1 puff into the lungs daily., Disp: 1 each, Rfl: 6   ZOVIA 1/35E, 28, 1-35 MG-MCG tablet, Take by mouth daily., Disp: , Rfl:     Assessment & Plan:  Right knee pain ? Sprain  Will need RICE  Will send off flexeril 5 mg q hs.  Ibuprufen - 600 mg q 8 hrly for pain   Problem List Items Addressed This Visit   None   No orders of the defined types were placed in this encounter.   No orders of the defined types were placed in this encounter.    Follow up plan: No follow-ups on file.

## 2021-10-14 NOTE — Patient Instructions (Signed)
Knee Sprain A knee sprain is a stretch or tear in a knee ligament. Knee ligaments are tissues that connect bones in the knee to each other. What are the causes? This condition often results from: A fall. An injury to the knee. What are the signs or symptoms? Symptoms of this condition include: Trouble straightening or bending the leg. Swelling in the knee. Bruising around the knee. Tenderness or pain in the knee. Sudden muscle tightening (spasms) around the knee. How is this treated? Treatment for this condition may involve: Keeping the knee still (immobilized) with a cast, brace, or splint. Putting ice on the knee. This helps with pain and swelling. Raising (elevating) the knee above the level of your heart when you are resting. This helps with pain and swelling. Taking medicine for pain. Doing exercises to keep your knee from being weak or stiff. Having surgery. This may be needed if the ligament is completely torn. Follow these instructions at home: If you have a splint or brace: Wear it as told by your doctor. Remove it only as told by your doctor. Check the skin around it every day. Tell your doctor about any concerns. Loosen it if your toes: Tingle. Become numb. Turn cold and blue. Keep it clean and dry. If you have a cast: Do not stick anything inside it to scratch your skin. Check the skin around it every day. Tell your doctor about any concerns. You may put lotion on dry skin around the edges of the cast. Do not put lotion on the skin under the cast. Keep it clean and dry. Bathing If you have a splint, brace, or cast that is not waterproof: Do not let it get wet. Cover it with a watertight covering when you take a bath or a shower. Managing pain, stiffness, and swelling  If told, put ice on the injured area. To do this: If you have a removable splint or brace, remove it as told by your doctor. Put ice in a plastic bag. Place a towel between your skin and the bag  or between your cast and the bag. Leave the ice on for 20 minutes, 2-3 times a day. Move your toes often to reduce stiffness and swelling. Raise the injured area above the level of your heart while you are sitting or lying down. General instructions Take over-the-counter and prescription medicines only as told by your doctor. Do not use any products that contain nicotine or tobacco, such as cigarettes, e-cigarettes, and chewing tobacco. These can delay healing. If you need help quitting, ask your doctor. Do exercises as told by your doctor. Keep all follow-up visits as told by your doctor. This is important. Contact a doctor if: Your pain gets worse. The cast, brace, or splint does not fit right. The cast, brace, or splint gets damaged. Get help right away if: You cannot use your knee to support your body weight (bear weight) for standing or walking. You cannot move the injured area. Your knee buckles or you have pain after you walk only a few steps. You have very bad pain, swelling, or numbness in your leg below the cast, brace, or splint. Your foot or toes are numb, cold, or blue after loosening your splint or brace. Summary A knee sprain is a stretch or tear in a tissue (ligament) that connects your knee bones to each other. You may need to wear a splint, brace, or cast to keep the knee still while it is getting better. Surgery may be  needed if the ligament is completely torn. This information is not intended to replace advice given to you by your health care provider. Make sure you discuss any questions you have with your health care provider. Document Revised: 08/04/2019 Document Reviewed: 08/04/2019 Elsevier Patient Education  [place].

## 2021-10-15 NOTE — Progress Notes (Signed)
Pl let her know she has mild OA

## 2021-10-21 ENCOUNTER — Telehealth: Payer: Self-pay | Admitting: Family Medicine

## 2021-10-21 NOTE — Telephone Encounter (Signed)
She does have some arthritis in her knee- this is what people call knee spurs. Is she feeling better? If not, I'd advise her to use some voltaren. If she's worse, let me know I'll get her into PT. Thanks.

## 2021-10-21 NOTE — Telephone Encounter (Signed)
-----   Message from [place], [place] sent at 10/17/2021 11:08 AM EST ----- Patient is concerned about her knee spurs. Would like to know what she can do about that. Patient states she is not taking her flexiril due to having small children and she needs to be aware.

## 2021-10-21 NOTE — Telephone Encounter (Signed)
Called and spoke with patient. She states that she is a little bit better. States she will let us know if she gets worse and decided to proceed with PT.

## 2021-10-23 ENCOUNTER — Other Ambulatory Visit: Payer: Self-pay

## 2021-10-23 ENCOUNTER — Encounter: Payer: Self-pay | Admitting: Nurse Practitioner

## 2021-10-23 ENCOUNTER — Encounter: Payer: Self-pay | Admitting: Family Medicine

## 2021-10-23 ENCOUNTER — Ambulatory Visit: Payer: 59 | Admitting: Nurse Practitioner

## 2021-10-23 VITALS — BP 133/85 | HR 101 | Temp 98.5°F | Wt 202.0 lb

## 2021-10-23 DIAGNOSIS — R051 Acute cough: Secondary | ICD-10-CM

## 2021-10-23 DIAGNOSIS — J069 Acute upper respiratory infection, unspecified: Secondary | ICD-10-CM | POA: Diagnosis not present

## 2021-10-23 DIAGNOSIS — J029 Acute pharyngitis, unspecified: Secondary | ICD-10-CM | POA: Diagnosis not present

## 2021-10-23 MED ORDER — PREDNISONE 10 MG PO TABS: 10.0000 mg | ORAL_TABLET | Freq: Every day | ORAL | 0 refills | Status: DC

## 2021-10-23 NOTE — Progress Notes (Signed)
Results discussed with patient during visit.

## 2021-10-23 NOTE — Progress Notes (Signed)
BP 133/85    Pulse (!) 101    Temp 98.5 F (36.9 C) (Oral)    Wt 202 lb (91.6 kg)    SpO2 100%    BMI 32.46 kg/m    Subjective:    Patient ID: Ashley Kidd, female    DOB: 08/22/1981, 41 y.o.   MRN: [id number]  HPI: Ashley Kidd is a 41 y.o. female  Chief Complaint  Patient presents with   Cough    Congestion, fever, fatigue, hoarse, sore throat all started 2 days ago. Mucinex DM   UPPER RESPIRATORY TRACT INFECTION Worst symptom: symptoms started 2 days ago Fever: yes Cough: yes Shortness of breath: yes Wheezing: yes Chest pain: yes Chest tightness: yes Chest congestion: no Nasal congestion: yes Runny nose: yes Post nasal drip: yes Sneezing: no Sore throat: yes Swollen glands: no Sinus pressure: no Headache: no Face pain: no Toothache: no Ear pain: yes bilateral Ear pressure: no bilateral Eyes red/itching:no Eye drainage/crusting: no  Vomiting: no Rash: no Fatigue: yes Sick contacts: no Strep contacts: no  Context: worse Recurrent sinusitis: no Relief with OTC cold/cough medications: yes  Treatments attempted: mucinex   Relevant past medical, surgical, family and social history reviewed and updated as indicated. Interim medical history since our last visit reviewed. Allergies and medications reviewed and updated.  Review of Systems  Per HPI unless specifically indicated above     Objective:    BP 133/85    Pulse (!) 101    Temp 98.5 F (36.9 C) (Oral)    Wt 202 lb (91.6 kg)    SpO2 100%    BMI 32.46 kg/m   Wt Readings from Last 3 Encounters:  10/23/21 202 lb (91.6 kg)  10/14/21 202 lb (91.6 kg)  09/29/21 205 lb (93 kg)    Physical Exam Vitals and nursing note reviewed.  Constitutional:      General: She is not in acute distress.    Appearance: Normal appearance. She is normal weight. She is not ill-appearing, toxic-appearing or diaphoretic.  HENT:     Head: Normocephalic.     Right Ear: Tympanic membrane and external ear normal.      Left Ear: Tympanic membrane and external ear normal.     Nose: Congestion and rhinorrhea present.     Right Sinus: No maxillary sinus tenderness or frontal sinus tenderness.     Left Sinus: No maxillary sinus tenderness or frontal sinus tenderness.     Mouth/Throat:     Mouth: Mucous membranes are moist.     Pharynx: Oropharynx is clear.  Eyes:     General:        Right eye: No discharge.        Left eye: No discharge.     Extraocular Movements: Extraocular movements intact.     Conjunctiva/sclera: Conjunctivae normal.     Pupils: Pupils are equal, round, and reactive to light.  Cardiovascular:     Rate and Rhythm: Normal rate and regular rhythm.     Heart sounds: No murmur heard. Pulmonary:     Effort: Pulmonary effort is normal. No respiratory distress.     Breath sounds: Wheezing present. No rales.  Musculoskeletal:     Cervical back: Normal range of motion and neck supple.  Skin:    General: Skin is warm and dry.     Capillary Refill: Capillary refill takes less than 2 seconds.  Neurological:     General: No focal deficit present.  Mental Status: She is alert and oriented to person, place, and time. Mental status is at baseline.  Psychiatric:        Mood and Affect: Mood normal.        Behavior: Behavior normal.        Thought Content: Thought content normal.        Judgment: Judgment normal.    Results for orders placed or performed in visit on 06/03/21  CBC with Differential/Platelet  Result Value Ref Range   WBC 10.7 3.4 - 10.8 x10E3/uL   RBC 5.18 3.77 - 5.28 x10E6/uL   Hemoglobin 12.8 11.1 - 15.9 g/dL   Hematocrit 40.3 34.0 - 46.6 %   MCV 78 (L) 79 - 97 fL   MCH 24.7 (L) 26.6 - 33.0 pg   MCHC 31.8 31.5 - 35.7 g/dL   RDW 14.5 11.7 - 15.4 %   Platelets 604 (H) 150 - 450 x10E3/uL   Neutrophils 61 Not Estab. %   Lymphs 30 Not Estab. %   Monocytes 6 Not Estab. %   Eos 1 Not Estab. %   Basos 1 Not Estab. %   Neutrophils Absolute 6.7 1.4 - 7.0 x10E3/uL    Lymphocytes Absolute 3.2 (H) 0.7 - 3.1 x10E3/uL   Monocytes Absolute 0.6 0.1 - 0.9 x10E3/uL   EOS (ABSOLUTE) 0.1 0.0 - 0.4 x10E3/uL   Basophils Absolute 0.1 0.0 - 0.2 x10E3/uL   Immature Granulocytes 1 Not Estab. %   Immature Grans (Abs) 0.1 0.0 - 0.1 x10E3/uL  Urinalysis, Routine w reflex microscopic  Result Value Ref Range   Specific Gravity, UA 1.010 1.005 - 1.030   pH, UA 6.0 5.0 - 7.5   Color, UA Yellow Yellow   Appearance Ur Clear Clear   Leukocytes,UA Negative Negative   Protein,UA Negative Negative/Trace   Glucose, UA Negative Negative   Ketones, UA Negative Negative   RBC, UA Negative Negative   Bilirubin, UA Negative Negative   Urobilinogen, Ur 0.2 0.2 - 1.0 mg/dL   Nitrite, UA Negative Negative      Assessment & Plan:   Problem List Items Addressed This Visit   None Visit Diagnoses     Viral upper respiratory tract infection    -  Primary   Flu and strep neg in office. Will treat with steroids due to wheezing. Will send out COVID. FU in office if symptoms worsen or fail to improve.   Sore throat       Relevant Orders   Novel Coronavirus, NAA ([facility])   Rapid Strep screen([facility])   Veritor Flu A/B Waived   Acute cough       Relevant Orders   Novel Coronavirus, NAA ([facility])   Rapid Strep screen([facility])   Veritor Flu A/B Waived        Follow up plan: Return if symptoms worsen or fail to improve.

## 2021-10-24 LAB — NOVEL CORONAVIRUS, NAA: SARS-CoV-2, NAA: NOT DETECTED

## 2021-10-24 LAB — SARS-COV-2, NAA 2 DAY TAT

## 2021-10-24 NOTE — Progress Notes (Signed)
HI Arta.  Your COVID test was negative.  Hope you are feeling better.

## 2021-10-26 LAB — VERITOR FLU A/B WAIVED
Influenza A: NEGATIVE
Influenza B: NEGATIVE

## 2021-10-26 LAB — RAPID STREP SCREEN (MED CTR MEBANE ONLY): Strep Gp A Ag, IA W/Reflex: NEGATIVE

## 2021-10-26 LAB — CULTURE, GROUP A STREP: Strep A Culture: NEGATIVE

## 2021-10-27 ENCOUNTER — Other Ambulatory Visit: Payer: Self-pay

## 2021-10-27 ENCOUNTER — Ambulatory Visit: Payer: Self-pay | Admitting: *Deleted

## 2021-10-27 ENCOUNTER — Emergency Department: Payer: 59

## 2021-10-27 ENCOUNTER — Emergency Department
Admission: EM | Admit: 2021-10-27 | Discharge: 2021-10-27 | Disposition: A | Discharge status: Home / Self Care | Payer: 59 | Attending: Emergency Medicine | Admitting: Emergency Medicine

## 2021-10-27 DIAGNOSIS — J069 Acute upper respiratory infection, unspecified: Secondary | ICD-10-CM

## 2021-10-27 DIAGNOSIS — Z7951 Long term (current) use of inhaled steroids: Secondary | ICD-10-CM | POA: Insufficient documentation

## 2021-10-27 DIAGNOSIS — E119 Type 2 diabetes mellitus without complications: Secondary | ICD-10-CM | POA: Insufficient documentation

## 2021-10-27 DIAGNOSIS — R059 Cough, unspecified: Secondary | ICD-10-CM | POA: Diagnosis present

## 2021-10-27 DIAGNOSIS — Z20822 Contact with and (suspected) exposure to covid-19: Secondary | ICD-10-CM | POA: Diagnosis not present

## 2021-10-27 DIAGNOSIS — J45909 Unspecified asthma, uncomplicated: Secondary | ICD-10-CM | POA: Insufficient documentation

## 2021-10-27 LAB — RESP PANEL BY RT-PCR (FLU A&B, COVID) ARPGX2
Influenza A by PCR: NEGATIVE
Influenza B by PCR: NEGATIVE
SARS Coronavirus 2 by RT PCR: NEGATIVE

## 2021-10-27 MED ORDER — BENZONATATE 200 MG PO CAPS: 200.0000 mg | ORAL_CAPSULE | Freq: Three times a day (TID) | ORAL | 0 refills | Status: DC | PRN

## 2021-10-27 NOTE — ED Provider Notes (Signed)
[facility] Provider Note    Event Date/Time   First MD Initiated Contact with Patient 10/27/21 [phone]     (approximate)   History   URI   HPI   Ashley Kidd is a 41 y.o. female   presents to the ED with complaint of cough, congestion and some shortness of breath for the last week.  Patient states that she was seen and told that she had a URI last week and was given prednisone for her asthma and continues to use her inhaler and nebulizer machine.  She currently has couple more days of prednisone to take.  She denies any other symptoms.  Patient has a history of asthma, type 2 diabetes without use of insulin, GERD, migraines and low back pain.      Physical Exam   Triage Vital Signs: ED Triage Vitals  Enc Vitals Group     BP 10/27/21 0925 (!) 159/90     Pulse Rate 10/27/21 0925 (!) 101     Resp 10/27/21 0925 20     Temp 10/27/21 0925 98.1 F (36.7 C)     Temp Source 10/27/21 0925 Oral     SpO2 10/27/21 0925 100 %     Weight 10/27/21 0924 201 lb 15.1 oz (91.6 kg)     Height 10/27/21 0924 5\' 6"  (1.676 m)     Head Circumference --      Peak Flow --      Pain Score --      Pain Loc --      Pain Edu? --      Excl. in [place]? --     Most recent vital signs: Vitals:   10/27/21 0925  BP: (!) 159/90  Pulse: (!) 101  Resp: 20  Temp: 98.1 F (36.7 C)  SpO2: 100%     General: Awake, no distress.  Able to speak in complete sentences without any difficulty. CV:  Good peripheral perfusion.  Heart regular rate and rhythm without murmur. Resp:  Normal effort.  Lungs are clear bilaterally.  No wheeze, rales or rhonchi noted.  Patient does have a very congested cough when she does cough. Abd:  No distention.       ED Results / Procedures / Treatments   Labs (all labs ordered are listed, but only abnormal results are displayed) Labs Reviewed  RESP PANEL BY RT-PCR (FLU A&B, COVID) [id number]      RADIOLOGY Chest x-ray images were reviewed and no  infiltrate was noted.  I reviewed radiology report with no cardiovascular disease noted.    PROCEDURES:  Critical Care performed:   Procedures   MEDICATIONS ORDERED IN ED: Medications - No data to display   IMPRESSION / MDM / [place] / ED COURSE  I reviewed the triage vital signs and the nursing notes.   Differential diagnosis includes, but is not limited to, exacerbation of asthma, COVID, influenza, viral URI, pneumonia.  41 year old female presents to the ED with complaint of continued cough and congestion.  Patient has already been seen once and was prescribed prednisone.  She continues with her routine asthma medications and felt she was not getting better.  She called her PCP who referred her to the emergency department.  I reviewed nasal swab and was negative for COVID and influenza.  Chest x-ray was reported negative by radiology and no infiltrates were noted by me.  I discussed viral process with patient, she is to continue with her prednisone until  finished, a prescription for Tessalon Perles was sent to her pharmacy to take every 8 hours as needed for cough.  Patient was afebrile in the ED with an O2 sat of 100%.  She is encouraged to follow-up with her PCP if any continued problems.  Continue to drink fluids to stay hydrated.  She is return to the emergency department if any severe worsening of her symptoms such as difficulty breathing or shortness of breath.        FINAL CLINICAL IMPRESSION(S) / ED DIAGNOSES   Final diagnoses:  Viral URI with cough     Rx / DC Orders   ED Discharge Orders          Ordered    benzonatate (TESSALON) 200 MG capsule  3 times daily PRN        10/27/21 1110             Note:  This document was prepared using Dragon voice recognition software and may include unintentional dictation errors.   Johnn Hai, PA-C 10/27/21 1120    Rada Hay, MD 10/27/21 [phone]

## 2021-10-27 NOTE — Telephone Encounter (Signed)
°  Chief Complaint: SOB Symptoms: labored breathing, SOB Frequency: constant- worsening symptoms Pertinent Negatives: Patient denies   Disposition: [x] ED /[] Urgent Care (no appt availability in office) / [] Appointment(In office/virtual)/ []  [place] [facility]/ [] Home Care/ [] Refused Recommended Disposition /[] [facility]/ []  Follow-up with PCP Additional Notes: Patient advised ED- now- call 911 for fainting/dizziness

## 2021-10-27 NOTE — ED Triage Notes (Signed)
Pt c/o cough with congestion and SOB for the past week, states she was told last week it was URI and given prednisone, states when she called this morning referred to the ED for further treatment

## 2021-10-27 NOTE — Telephone Encounter (Signed)
Answer Assessment - Initial Assessment Questions 1. RESPIRATORY STATUS: "Describe your breathing?" (e.g., wheezing, shortness of breath, unable to speak, severe coughing)      SOB, out of breath 2. ONSET: "When did this breathing problem begin?"      Last week 3. PATTERN "Does the difficult breathing come and go, or has it been constant since it started?"      ongoing 4. SEVERITY: "How bad is your breathing?" (e.g., mild, moderate, severe)    - MILD: No SOB at rest, mild SOB with walking, speaks normally in sentences, can lie down, no retractions, pulse < 100.    - MODERATE: SOB at rest, SOB with minimal exertion and prefers to sit, cannot lie down flat, speaks in phrases, mild retractions, audible wheezing, pulse 100-120.    - SEVERE: Very SOB at rest, speaks in single words, struggling to breathe, sitting hunched forward, retractions, pulse > 120      severe 5. RECURRENT SYMPTOM: "Have you had difficulty breathing before?" If Yes, ask: "When was the last time?" and "What happened that time?"      *No Answer* 6. CARDIAC HISTORY: "Do you have any history of heart disease?" (e.g., heart attack, angina, bypass surgery, angioplasty)      *No Answer* 7. LUNG HISTORY: "Do you have any history of lung disease?"  (e.g., pulmonary embolus, asthma, emphysema)     *No Answer* 8. CAUSE: "What do you think is causing the breathing problem?"      *No Answer* 9. OTHER SYMPTOMS: "Do you have any other symptoms? (e.g., dizziness, runny nose, cough, chest pain, fever)     *No Answer* 10. O2 SATURATION MONITOR:  "Do you use an oxygen saturation monitor (pulse oximeter) at home?" If Yes, "What is your reading (oxygen level) today?" "What is your usual oxygen saturation reading?" (e.g., 95%)       *No Answer* 11. PREGNANCY: "Is there any chance you are pregnant?" "When was your last menstrual period?"       *No Answer* 12. TRAVEL: "Have you traveled out of the country in the last month?" (e.g., travel history,  exposures)       *No Answer*  Protocols used: Breathing Difficulty-A-AH

## 2021-10-27 NOTE — ED Notes (Signed)
See triage note  presents with cough,congestion and some SOB  states sx's started 1 week ago  was seen by PCP and had negative COVID swab  placed on prednisone   states cough is productive  greenish phlegm   afebrile on arrival

## 2021-10-27 NOTE — Discharge Instructions (Addendum)
Follow-up with your primary care provider if any continued problems.  Continue taking your regular medication as prescribed.  A prescription for Ladona Ridgel was sent to your pharmacy.  This will not cause drowsiness and can be taken at work.  This medication is every 8 hours if needed for cough.  Increase fluids.  Tylenol or ibuprofen if needed for body aches, fever or headache.

## 2021-11-04 ENCOUNTER — Ambulatory Visit: Payer: 59 | Admitting: Family Medicine

## 2021-11-04 ENCOUNTER — Other Ambulatory Visit: Payer: Self-pay

## 2021-11-04 ENCOUNTER — Encounter: Payer: Self-pay | Admitting: Family Medicine

## 2021-11-04 VITALS — BP 146/77 | HR 85 | Temp 98.3°F | Wt 208.2 lb

## 2021-11-04 DIAGNOSIS — M1710 Unilateral primary osteoarthritis, unspecified knee: Secondary | ICD-10-CM

## 2021-11-04 DIAGNOSIS — E119 Type 2 diabetes mellitus without complications: Secondary | ICD-10-CM

## 2021-11-04 LAB — BAYER DCA HB A1C WAIVED: HB A1C (BAYER DCA - WAIVED): 6.6 % — ABNORMAL HIGH (ref 4.8–5.6)

## 2021-11-04 MED ORDER — NAPROXEN 500 MG PO TABS: 500.0000 mg | ORAL_TABLET | Freq: Two times a day (BID) | ORAL | 1 refills | Status: DC

## 2021-11-04 NOTE — Assessment & Plan Note (Signed)
Rechecking labs today. Await results. Treat as needed.  °

## 2021-11-04 NOTE — Assessment & Plan Note (Signed)
Will continue NSAIDs. Stretches given. Significantly better. Continue to monitor. Call with any concerns.

## 2021-11-04 NOTE — Progress Notes (Signed)
BP (!) 146/77 (BP Location: Left Arm, Cuff Size: Normal)    Pulse 85    Temp 98.3 F (36.8 C) (Oral)    Wt 208 lb 3.2 oz (94.4 kg)    LMP 10/29/2021 (Exact Date)    SpO2 100%    BMI 33.60 kg/m    Subjective:    Patient ID: Ashley Kidd, female    DOB: 10/04/1980, 41 y.o.   MRN: [id number]  HPI: Ashley Kidd is a 41 y.o. female  Chief Complaint  Patient presents with   Knee Pain   KNEE PAIN Duration: few weeks Involved knee: right Mechanism of injury: unknown Location:diffuse Onset: gradual Severity: mild  Quality:  aching Frequency: intermittent Radiation: no Aggravating factors: weight bearing and walking  Alleviating factors: naproxen  Status: better Treatments attempted: rest, ice, heat, APAP, ibuprofen, and aleve  Relief with NSAIDs?:  significant Weakness with weight bearing or walking: no Sensation of giving way: no Locking: no Popping: no Bruising: no Swelling: no Redness: no Paresthesias/decreased sensation: no Fevers: no  DIABETES Hypoglycemic episodes:no Polydipsia/polyuria: no Visual disturbance: no Chest pain: no Paresthesias: no Glucose Monitoring: no  Accucheck frequency: Not Checking Taking Insulin?: no Blood Pressure Monitoring: not checking Retinal Examination: Up to Date Foot Exam: Up to Date Diabetic Education: Completed Pneumovax: Up to Date Influenza: Up to Date Aspirin: yes   Relevant past medical, surgical, family and social history reviewed and updated as indicated. Interim medical history since our last visit reviewed. Allergies and medications reviewed and updated.  Review of Systems  Constitutional: Negative.   Respiratory: Negative.    Cardiovascular: Negative.   Gastrointestinal: Negative.   Musculoskeletal:  Positive for arthralgias. Negative for back pain, gait problem, joint swelling, myalgias, neck pain and neck stiffness.  Skin: Negative.   Neurological: Negative.   Psychiatric/Behavioral: Negative.      Per HPI unless specifically indicated above     Objective:    BP (!) 146/77 (BP Location: Left Arm, Cuff Size: Normal)    Pulse 85    Temp 98.3 F (36.8 C) (Oral)    Wt 208 lb 3.2 oz (94.4 kg)    LMP 10/29/2021 (Exact Date)    SpO2 100%    BMI 33.60 kg/m   Wt Readings from Last 3 Encounters:  11/04/21 208 lb 3.2 oz (94.4 kg)  10/27/21 201 lb 15.1 oz (91.6 kg)  10/23/21 202 lb (91.6 kg)    Physical Exam Vitals and nursing note reviewed.  Constitutional:      General: She is not in acute distress.    Appearance: Normal appearance. She is not ill-appearing, toxic-appearing or diaphoretic.  HENT:     Head: Normocephalic and atraumatic.     Right Ear: External ear normal.     Left Ear: External ear normal.     Nose: Nose normal.     Mouth/Throat:     Mouth: Mucous membranes are moist.     Pharynx: Oropharynx is clear.  Eyes:     General: No scleral icterus.       Right eye: No discharge.        Left eye: No discharge.     Extraocular Movements: Extraocular movements intact.     Conjunctiva/sclera: Conjunctivae normal.     Pupils: Pupils are equal, round, and reactive to light.  Cardiovascular:     Rate and Rhythm: Normal rate and regular rhythm.     Pulses: Normal pulses.     Heart sounds: Normal heart sounds. No  murmur heard.   No friction rub. No gallop.  Pulmonary:     Effort: Pulmonary effort is normal. No respiratory distress.     Breath sounds: Normal breath sounds. No stridor. No wheezing, rhonchi or rales.  Chest:     Chest wall: No tenderness.  Musculoskeletal:        General: Normal range of motion.     Cervical back: Normal range of motion and neck supple.  Skin:    General: Skin is warm and dry.     Capillary Refill: Capillary refill takes less than 2 seconds.     Coloration: Skin is not jaundiced or pale.     Findings: No bruising, erythema, lesion or rash.  Neurological:     General: No focal deficit present.     Mental Status: She is alert and  oriented to person, place, and time. Mental status is at baseline.  Psychiatric:        Mood and Affect: Mood normal.        Behavior: Behavior normal.        Thought Content: Thought content normal.        Judgment: Judgment normal.    Results for orders placed or performed during the hospital encounter of 10/27/21  Resp Panel by RT-PCR (Flu A&B, Covid) Nasopharyngeal Swab   Specimen: Nasopharyngeal Swab; Nasopharyngeal(NP) swabs in vial transport medium  Result Value Ref Range   SARS Coronavirus 2 by RT PCR NEGATIVE NEGATIVE   Influenza A by PCR NEGATIVE NEGATIVE   Influenza B by PCR NEGATIVE NEGATIVE      Assessment & Plan:   Problem List Items Addressed This Visit       Endocrine   Type 2 diabetes mellitus without complication, without long-term current use of insulin ([place]) - Primary    Rechecking labs today. Await results. Treat as needed.       Relevant Orders   Bayer DCA Hb A1c Waived     Musculoskeletal and Integument   Arthritis of knee    Will continue NSAIDs. Stretches given. Significantly better. Continue to monitor. Call with any concerns.       Relevant Medications   naproxen (NAPROSYN) 500 MG tablet     Follow up plan: Return After 4/21 for physical.

## 2021-12-13 ENCOUNTER — Other Ambulatory Visit: Payer: Self-pay

## 2021-12-13 ENCOUNTER — Ambulatory Visit
Admission: EM | Admit: 2021-12-13 | Discharge: 2021-12-13 | Disposition: A | Discharge status: Home / Self Care | Payer: 59 | Attending: Physician Assistant | Admitting: Physician Assistant

## 2021-12-13 DIAGNOSIS — B9689 Other specified bacterial agents as the cause of diseases classified elsewhere: Secondary | ICD-10-CM

## 2021-12-13 DIAGNOSIS — R300 Dysuria: Secondary | ICD-10-CM | POA: Diagnosis present

## 2021-12-13 DIAGNOSIS — N760 Acute vaginitis: Secondary | ICD-10-CM | POA: Diagnosis present

## 2021-12-13 LAB — WET PREP, GENITAL
Sperm: NONE SEEN
Trich, Wet Prep: NONE SEEN
WBC, Wet Prep HPF POC: <10 (ref <10)
Yeast Wet Prep HPF POC: NONE SEEN

## 2021-12-13 LAB — URINALYSIS, ROUTINE W REFLEX MICROSCOPIC
Bilirubin Urine: NEGATIVE
Glucose, UA: NEGATIVE mg/dL
Hgb urine dipstick: NEGATIVE
Ketones, ur: NEGATIVE mg/dL
Leukocytes,Ua: NEGATIVE
Nitrite: NEGATIVE
Protein, ur: NEGATIVE mg/dL
Specific Gravity, Urine: <1.005 — ABNORMAL LOW (ref 1.005–1.030)
pH: 6.5 (ref 5.0–8.0)

## 2021-12-13 MED ORDER — METRONIDAZOLE 500 MG PO TABS
500.0000 mg | ORAL_TABLET | Freq: Two times a day (BID) | ORAL | 0 refills | Status: DC
Start: 2021-12-13 — End: 2021-12-27

## 2021-12-13 NOTE — ED Provider Notes (Signed)
?[facility] ? ? ? ?CSN: [id number] ?Arrival date & time: 12/13/21  1138 ? ? ?  ? ?History   ?Chief Complaint ?Chief Complaint  ?Patient presents with  ? UTI Symptoms  ? ? ?HPI ?Ashley Kidd is a 41 y.o. female presenting for onset of suprapubic pain, dysuria and lower back pain last night.  Patient says "it hurts in the kidney area."  She also says her urine has smelled a little strong over the past week.  Denies any change in urinary frequency or urgency.  States she is very hydrated and goes to bathroom a lot anyway.  No reports of fever or hematuria.  Denies vaginal discharge or odor.  No other complaints. ? ?HPI ? ?Past Medical History:  ?Diagnosis Date  ? Allergy   ? Asthma   ? GERD (gastroesophageal reflux disease)   ? Migraine   ? PCOS (polycystic ovarian syndrome)   ? PCOS (polycystic ovarian syndrome)   ? Sinus complaint   ? Type 2 diabetes mellitus without complication, without long-term current use of insulin ([place]) 07/26/2019  ? ? ?Patient Active Problem List  ? Diagnosis Date Noted  ? Arthritis of knee 11/04/2021  ? Type 2 diabetes mellitus without complication, without long-term current use of insulin ([place]) 07/26/2019  ? Moderate persistent asthma without complication 71/69/6789  ? C. difficile diarrhea 12/24/2015  ? Migraine   ? Allergy   ? Low back pain 03/26/2015  ? Chronic cholecystitis without calculus 05/19/2013  ? ? ?Past Surgical History:  ?Procedure Laterality Date  ? CHOLECYSTECTOMY  06-09-13  ? OVARIAN CYST REMOVAL Bilateral 2002  ? UPPER GI ENDOSCOPY  11-29-12  ? Dr Candace Cruise  ? ? ?OB History   ? ? Gravida  ?0  ? Para  ?0  ? Term  ?0  ? Preterm  ?0  ? AB  ?0  ? Living  ?0  ?  ? ? SAB  ?0  ? IAB  ?0  ? Ectopic  ?0  ? Multiple  ?0  ? Live Births  ?   ?   ?  ? Obstetric Comments  ?1st Menstrual Cycle: 13 ?1st Pregnancy:  NA  ?  ? ?  ? ? ? ?Home Medications   ? ?Prior to Admission medications   ?Medication Sig Start Date End Date Taking? Authorizing Provider  ?aspirin 81 MG tablet Take 81  mg by mouth daily.   Yes [provider]  ?cetirizine (ZYRTEC) 10 MG tablet Take 10 mg by mouth daily.   Yes [provider]  ?fluticasone (FLONASE) 50 MCG/ACT nasal spray INSTILL 2 SPRAYS IN EACH NOSTRIL EVERY DAY 01/16/21  Yes Johnson, Megan P, DO  ?metroNIDAZOLE (FLAGYL) 500 MG tablet Take 1 tablet (500 mg total) by mouth 2 (two) times daily. 12/13/21  Yes Laurene Footman B, PA-C  ?montelukast (SINGULAIR) 10 MG tablet TAKE 1 TABLET(10 MG) BY MOUTH AT BEDTIME 07/13/21  Yes Johnson, Megan P, DO  ?omeprazole (PRILOSEC) 40 MG capsule Take 1 capsule (40 mg total) by mouth daily. 01/16/21  Yes Johnson, Megan P, DO  ?Probiotic Product (PROBIOTIC DAILY PO) Take 1 capsule by mouth daily.   Yes [provider]  ?ZOVIA 1/35E, 28, 1-35 MG-MCG tablet Take by mouth daily. 05/03/16  Yes [provider]  ?albuterol (VENTOLIN HFA) 108 (90 Base) MCG/ACT inhaler INHALE 2 PUFFS INTO THE LUNGS EVERY 6 HOURS AS NEEDED FOR WHEEZING 01/16/21   Johnson, Megan P, DO  ?citalopram (CELEXA) 20 MG tablet Take 20  mg by mouth daily.    [provider]  ?EPINEPHrine 0.3 mg/0.3 mL IJ SOAJ injection INJ 0.3 ML IM ONCE FOR 1 DOSE 03/04/18   [provider]  ?naproxen (NAPROSYN) 500 MG tablet Take 1 tablet (500 mg total) by mouth 2 (two) times daily with a meal. 11/04/21   Johnson, Megan P, DO  ?promethazine (PHENERGAN) 25 MG tablet Take 1 tablet (25 mg total) by mouth every 6 (six) hours as needed for nausea or vomiting. 05/23/21   Harvest Dark, MD  ?SUMAtriptan (IMITREX) 100 MG tablet Take 1 tablet (100 mg total) by mouth every 2 (two) hours as needed for migraine. One tab at onset and may repeat x1 in 1 hour. Max 200 mg/24 hours 01/16/21   Johnson, Megan P, DO  ?umeclidinium bromide (INCRUSE ELLIPTA) 62.5 MCG/INH AEPB Inhale 1 puff into the lungs daily. 01/16/21   Park Liter P, DO  ?ipratropium (ATROVENT) 0.06 % nasal spray Place 2 sprays into both nostrils 4 (four) times daily as needed for  rhinitis. 10/15/20 01/11/21  Coral Spikes, DO  ? ? ?Family History ?Family History  ?Problem Relation Age of Onset  ? Diabetes Mother   ? Asthma Mother   ? Diabetes Father   ? Heart attack Father   ? Heart disease Father   ? Asthma Sister   ? Bipolar disorder Sister   ? Asthma Brother   ? Bipolar disorder Brother   ? Epilepsy Brother   ? ? ?Social History ?Social History  ? ?Tobacco Use  ? Smoking status: Former  ?  Years: 12.00  ?  Types: Cigarettes  ?  Quit date: 08/24/2012  ?  Years since quitting: 9.3  ? Smokeless tobacco: Never  ?Vaping Use  ? Vaping Use: Never used  ?Substance Use Topics  ? Alcohol use: No  ?  Alcohol/week: 0.0 standard drinks  ? Drug use: No  ? ? ? ?Allergies   ?Hydrocodone, Tussionex pennkinetic er [hydrocod poli-chlorphe poli er], and Bee venom ? ? ?Review of Systems ?Review of Systems  ?Constitutional:  Negative for chills and fever.  ?Gastrointestinal:  Positive for abdominal pain. Negative for diarrhea, nausea and vomiting.  ?Genitourinary:  Positive for dysuria and flank pain. Negative for decreased urine volume, frequency, hematuria, pelvic pain, urgency, vaginal bleeding, vaginal discharge and vaginal pain.  ?Musculoskeletal:  Positive for back pain.  ?Skin:  Negative for rash.  ? ? ?Physical Exam ?Triage Vital Signs ?ED Triage Vitals  ?Enc Vitals Group  ?   BP 12/13/21 1147 (!) 158/102  ?   Pulse Rate 12/13/21 1147 94  ?   Resp 12/13/21 1147 20  ?   Temp 12/13/21 1147 98 ?F (36.7 ?C)  ?   Temp Source 12/13/21 1147 Oral  ?   SpO2 12/13/21 1147 98 %  ?   Weight 12/13/21 1144 208 lb (94.3 kg)  ?   Height 12/13/21 1144 '5\' 7"'$  (1.702 m)  ?   Head Circumference --   ?   Peak Flow --   ?   Pain Score 12/13/21 1143 5  ?   Pain Loc --   ?   Pain Edu? --   ?   Excl. in [place]? --   ? ?No data found. ? ?Updated Vital Signs ?BP (!) 158/102 (BP Location: Left Arm)   Pulse 94   Temp 98 ?F (36.7 ?C) (Oral)   Resp 20   Ht '5\' 7"'$  (1.702 m)   Wt 208 lb (94.3 kg)  LMP 12/06/2021 (Approximate)   SpO2  98%   BMI 32.58 kg/m?  ? ?  ? ?Physical Exam ?Vitals and nursing note reviewed.  ?Constitutional:   ?   General: She is not in acute distress. ?   Appearance: Normal appearance. She is not ill-appearing or toxic-appearing.  ?HENT:  ?   Head: Normocephalic and atraumatic.  ?Eyes:  ?   General: No scleral icterus.    ?   Right eye: No discharge.     ?   Left eye: No discharge.  ?   Conjunctiva/sclera: Conjunctivae normal.  ?Cardiovascular:  ?   Rate and Rhythm: Normal rate and regular rhythm.  ?   Heart sounds: Normal heart sounds.  ?Pulmonary:  ?   Effort: Pulmonary effort is normal. No respiratory distress.  ?   Breath sounds: Normal breath sounds.  ?Abdominal:  ?   Palpations: Abdomen is soft.  ?   Tenderness: There is abdominal tenderness (suprapubic). There is right CVA tenderness and left CVA tenderness.  ?Musculoskeletal:  ?   Cervical back: Neck supple.  ?Skin: ?   General: Skin is dry.  ?Neurological:  ?   General: No focal deficit present.  ?   Mental Status: She is alert. Mental status is at baseline.  ?   Motor: No weakness.  ?   Gait: Gait normal.  ?Psychiatric:     ?   Mood and Affect: Mood normal.     ?   Behavior: Behavior normal.     ?   Thought Content: Thought content normal.  ? ? ? ?UC Treatments / Results  ?Labs ?(all labs ordered are listed, but only abnormal results are displayed) ?Labs Reviewed  ?WET PREP, GENITAL - Abnormal; Notable for the following components:  ?    Result Value  ? Clue Cells Wet Prep HPF POC PRESENT (*)   ? All other components within normal limits  ?URINALYSIS, ROUTINE W REFLEX MICROSCOPIC - Abnormal; Notable for the following components:  ? Specific Gravity, Urine <1.005 (*)   ? All other components within normal limits  ?URINE CULTURE  ? ? ?EKG ? ? ?Radiology ?No results found. ? ?Procedures ?Procedures (including critical care time) ? ?Medications Ordered in UC ?Medications - No data to display ? ?Initial Impression / Assessment and Plan / UC Course  ?I have reviewed  the triage vital signs and the nursing notes. ? ?Pertinent labs & imaging results that were available during my care of the patient were reviewed by me and considered in my medical decision making (see chart for detai

## 2021-12-13 NOTE — Discharge Instructions (Addendum)
-  The urine looks clean but I am going to send for culture and if bacteria grows we will call and start you on antibiotic. ?- You have bacterial vaginosis.  See more information in the handout.  I sent an antibiotic for that. ?- Follow-up as needed. ?

## 2021-12-13 NOTE — ED Triage Notes (Signed)
Patient is here for "UTI symptoms". "Hurts with voiding, no urgency, no vaginal discharge, no concern for STI". No fever.  ?

## 2021-12-15 LAB — URINE CULTURE: Culture: NO GROWTH

## 2021-12-27 ENCOUNTER — Telehealth: Payer: 59 | Admitting: Nurse Practitioner

## 2021-12-27 DIAGNOSIS — J019 Acute sinusitis, unspecified: Secondary | ICD-10-CM | POA: Diagnosis not present

## 2021-12-27 DIAGNOSIS — B9689 Other specified bacterial agents as the cause of diseases classified elsewhere: Secondary | ICD-10-CM | POA: Diagnosis not present

## 2021-12-27 MED ORDER — PROMETHAZINE-DM 6.25-15 MG/5ML PO SYRP
5.0000 mL | ORAL_SOLUTION | Freq: Four times a day (QID) | ORAL | 0 refills | Status: DC | PRN
Start: 2021-12-27 — End: 2022-01-19

## 2021-12-27 MED ORDER — AMOXICILLIN-POT CLAVULANATE 875-125 MG PO TABS: 1.0000 | ORAL_TABLET | Freq: Two times a day (BID) | ORAL | 0 refills | Status: AC

## 2021-12-27 MED ORDER — FLUTICASONE PROPIONATE 50 MCG/ACT NA SUSP: NASAL | 12 refills | Status: DC

## 2021-12-27 NOTE — Progress Notes (Signed)
I have spent 5 minutes in review of e-visit questionnaire, review and updating patient chart, medical decision making and response to patient.  ° °Shanquita Ronning W Yazmina Pareja, NP ° °  °

## 2021-12-27 NOTE — Progress Notes (Signed)

## 2022-01-19 ENCOUNTER — Encounter: Payer: Self-pay | Admitting: Family Medicine

## 2022-01-19 ENCOUNTER — Ambulatory Visit (INDEPENDENT_AMBULATORY_CARE_PROVIDER_SITE_OTHER): Payer: 59 | Admitting: Family Medicine

## 2022-01-19 VITALS — BP 134/75 | HR 87 | Temp 98.5°F | Ht 67.0 in | Wt 212.4 lb

## 2022-01-19 DIAGNOSIS — G43709 Chronic migraine without aura, not intractable, without status migrainosus: Secondary | ICD-10-CM

## 2022-01-19 DIAGNOSIS — J4540 Moderate persistent asthma, uncomplicated: Secondary | ICD-10-CM | POA: Diagnosis not present

## 2022-01-19 DIAGNOSIS — T7840XD Allergy, unspecified, subsequent encounter: Secondary | ICD-10-CM

## 2022-01-19 DIAGNOSIS — E119 Type 2 diabetes mellitus without complications: Secondary | ICD-10-CM | POA: Diagnosis not present

## 2022-01-19 DIAGNOSIS — Z1231 Encounter for screening mammogram for malignant neoplasm of breast: Secondary | ICD-10-CM

## 2022-01-19 DIAGNOSIS — Z0000 Encounter for general adult medical examination without abnormal findings: Secondary | ICD-10-CM | POA: Diagnosis not present

## 2022-01-19 LAB — URINALYSIS, ROUTINE W REFLEX MICROSCOPIC
Bilirubin, UA: NEGATIVE
Glucose, UA: NEGATIVE
Ketones, UA: NEGATIVE
Leukocytes,UA: NEGATIVE
Nitrite, UA: NEGATIVE
Protein,UA: NEGATIVE
RBC, UA: NEGATIVE
Specific Gravity, UA: 1.015 (ref 1.005–1.030)
Urobilinogen, Ur: 0.2 mg/dL (ref 0.2–1.0)
pH, UA: 6.5 (ref 5.0–7.5)

## 2022-01-19 LAB — MICROALBUMIN, URINE WAIVED
Creatinine, Urine Waived: 10 mg/dL (ref 10–300)
Microalb, Ur Waived: 10 mg/L (ref 0–19)
Microalb/Creat Ratio: <30 mg/g (ref <30)

## 2022-01-19 LAB — BAYER DCA HB A1C WAIVED: HB A1C (BAYER DCA - WAIVED): 6.4 % — ABNORMAL HIGH (ref 4.8–5.6)

## 2022-01-19 MED ORDER — NAPROXEN 500 MG PO TABS: 500.0000 mg | ORAL_TABLET | Freq: Two times a day (BID) | ORAL | 1 refills | Status: DC

## 2022-01-19 MED ORDER — OMEPRAZOLE 40 MG PO CPDR: 40.0000 mg | DELAYED_RELEASE_CAPSULE | Freq: Every day | ORAL | 1 refills | Status: DC

## 2022-01-19 MED ORDER — ALBUTEROL SULFATE HFA 108 (90 BASE) MCG/ACT IN AERS: INHALATION_SPRAY | RESPIRATORY_TRACT | 6 refills | Status: DC

## 2022-01-19 MED ORDER — SUMATRIPTAN SUCCINATE 100 MG PO TABS: 100.0000 mg | ORAL_TABLET | ORAL | 12 refills | Status: DC | PRN

## 2022-01-19 MED ORDER — FLUTICASONE PROPIONATE 50 MCG/ACT NA SUSP: NASAL | 12 refills | Status: DC

## 2022-01-19 MED ORDER — PROMETHAZINE HCL 25 MG PO TABS: 25.0000 mg | ORAL_TABLET | Freq: Four times a day (QID) | ORAL | 0 refills | Status: AC | PRN

## 2022-01-19 MED ORDER — EPINEPHRINE 0.3 MG/0.3ML IJ SOAJ: INTRAMUSCULAR | 12 refills | Status: AC

## 2022-01-19 MED ORDER — CITALOPRAM HYDROBROMIDE 20 MG PO TABS: 20.0000 mg | ORAL_TABLET | Freq: Every day | ORAL | 1 refills | Status: DC

## 2022-01-19 MED ORDER — UMECLIDINIUM BROMIDE 62.5 MCG/ACT IN AEPB
1.0000 | INHALATION_SPRAY | Freq: Every day | RESPIRATORY_TRACT | 12 refills | Status: DC
Start: 2022-01-19 — End: 2022-02-12

## 2022-01-19 MED ORDER — MONTELUKAST SODIUM 10 MG PO TABS: 10.0000 mg | ORAL_TABLET | Freq: Every day | ORAL | 1 refills | Status: DC

## 2022-01-19 NOTE — Assessment & Plan Note (Signed)
Under good control on current regimen. Continue current regimen. Continue to monitor. Call with any concerns. Refills given. Labs drawn today.   

## 2022-01-19 NOTE — Assessment & Plan Note (Signed)
Doing well with A1c of 6.4 down from 6.6. Continue current regimen. Continue to monitor. Call with any concerns.  ?

## 2022-01-19 NOTE — Progress Notes (Signed)
? ?BP 134/75   Pulse 87   Temp 98.5 ?F (36.9 ?C)   Ht '5\' 7"'$  (1.702 m)   Wt 212 lb 6.4 oz (96.3 kg)   LMP 12/19/2021 (Approximate)   SpO2 98%   BMI 33.27 kg/m?   ? ?Subjective:  ? ? Patient ID: Ashley Kidd, female    DOB: September 17, 1981, 41 y.o.   MRN: [id number] ? ?HPI: ?Ashley Kidd is a 41 y.o. female presenting on 01/19/2022 for comprehensive medical examination. Current medical complaints include: ? ?DIABETES ?Hypoglycemic episodes:no ?Polydipsia/polyuria: no ?Visual disturbance: no ?Chest pain: no ?Paresthesias: no ?Glucose Monitoring: no ? Accucheck frequency: Not Checking ?Taking Insulin?: no ?Blood Pressure Monitoring: not checking ?Retinal Examination: Up to Date ?Foot Exam: Up to Date ?Diabetic Education: Completed ?Pneumovax: Up to Date ?Influenza: Up to Date ?Aspirin: no ? ?ANXIETY/STRESS ?Duration: chronic ?Stable:controlled ?Anxious mood: no  ?Excessive worrying: no ?Irritability: no  ?Sweating: no ?Nausea: no ?Palpitations:no ?Hyperventilation: no ?Panic attacks: no ?Agoraphobia: no  ?Obscessions/compulsions: no ?Depressed mood: no ? ?  01/19/2022  ?  1:29 PM 11/04/2021  ? 11:08 AM 10/23/2021  ?  3:28 PM 10/14/2021  ?  3:11 PM 06/03/2021  ? 11:22 AM  ?Depression screen PHQ 2/9  ?Decreased Interest 0 1 1 0 1  ?Down, Depressed, Hopeless 0 1 1 0 0  ?PHQ - 2 Score 0 2 2 0 1  ?Altered sleeping '1 1 2 2 3  '$ ?Tired, decreased energy '1 1 2 2 3  '$ ?Change in appetite '2 1 1 2 3  '$ ?Feeling bad or failure about yourself  0 1 1 0 0  ?Trouble concentrating '1 1 1 '$ 0 0  ?Moving slowly or fidgety/restless 0 1 0 1 0  ?Suicidal thoughts 0 1 0 0 0  ?PHQ-9 Score '5 9 9 7 10  '$ ?Difficult doing work/chores  Somewhat difficult Somewhat difficult Somewhat difficult   ? ?Anhedonia: no ?Weight changes: no ?Insomnia: no   ?Hypersomnia: no ?Fatigue/loss of energy: no ?Feelings of worthlessness: no ?Feelings of guilt: no ?Impaired concentration/indecisiveness: no ?Suicidal ideations: no  ?Crying spells: no ?Recent Stressors/Life  Changes: no ?  Relationship problems: no ?  Family stress: yes   ?  Financial stress: no  ?  Job stress: yes  ?  Recent death/loss: no ? ?ASTHMA ?Asthma status: controlled ?Satisfied with current treatment?: yes ?Albuterol/rescue inhaler frequency: occasional ?Dyspnea frequency: occasional ?Wheezing frequency: occasional ?Cough frequency: occasional ?Nocturnal symptom frequency: never ?Limitation of activity: no ?Current upper respiratory symptoms: no ?Aerochamber/spacer use: no ?Visits to ER or Urgent Care in past year: no ?Pneumovax: up to date ?Influenza: Up to Date ? ?She currently lives with: husband and kids ?Menopausal Symptoms: no ? ?Depression Screen done today and results listed below:  ? ?  01/19/2022  ?  1:29 PM 11/04/2021  ? 11:08 AM 10/23/2021  ?  3:28 PM 10/14/2021  ?  3:11 PM 06/03/2021  ? 11:22 AM  ?Depression screen PHQ 2/9  ?Decreased Interest 0 1 1 0 1  ?Down, Depressed, Hopeless 0 1 1 0 0  ?PHQ - 2 Score 0 2 2 0 1  ?Altered sleeping '1 1 2 2 3  '$ ?Tired, decreased energy '1 1 2 2 3  '$ ?Change in appetite '2 1 1 2 3  '$ ?Feeling bad or failure about yourself  0 1 1 0 0  ?Trouble concentrating '1 1 1 '$ 0 0  ?Moving slowly or fidgety/restless 0 1 0 1 0  ?Suicidal thoughts 0 1 0 0 0  ?PHQ-9  Score '5 9 9 7 10  '$ ?Difficult doing work/chores  Somewhat difficult Somewhat difficult Somewhat difficult   ? ? ?Past Medical History:  ?Past Medical History:  ?Diagnosis Date  ? Allergy   ? Asthma   ? GERD (gastroesophageal reflux disease)   ? Migraine   ? PCOS (polycystic ovarian syndrome)   ? PCOS (polycystic ovarian syndrome)   ? Sinus complaint   ? Type 2 diabetes mellitus without complication, without long-term current use of insulin ([place]) 07/26/2019  ? ? ?Surgical History:  ?Past Surgical History:  ?Procedure Laterality Date  ? CHOLECYSTECTOMY  06-09-13  ? OVARIAN CYST REMOVAL Bilateral 2002  ? UPPER GI ENDOSCOPY  11-29-12  ? Dr Candace Cruise  ? ? ?Medications:  ?Current Outpatient Medications on File Prior to Visit  ?Medication Sig  ?  aspirin 81 MG tablet Take 81 mg by mouth daily.  ? cetirizine (ZYRTEC) 10 MG tablet Take 10 mg by mouth daily.  ? Probiotic Product (PROBIOTIC DAILY PO) Take 1 capsule by mouth daily.  ? ZOVIA 1/35E, 28, 1-35 MG-MCG tablet Take by mouth daily.  ? [DISCONTINUED] ipratropium (ATROVENT) 0.06 % nasal spray Place 2 sprays into both nostrils 4 (four) times daily as needed for rhinitis.  ? ?No current facility-administered medications on file prior to visit.  ? ? ?Allergies:  ?Allergies  ?Allergen Reactions  ? Hydrocodone Hives and Itching  ? Tussionex Pennkinetic Er [Hydrocod Poli-Chlorphe Poli Er] Hives and Itching  ? Bee Venom Hives  ? ? ?Social History:  ?Social History  ? ?Socioeconomic History  ? Marital status: Married  ?  Spouse name: Not on file  ? Number of children: Not on file  ? Years of education: Not on file  ? Highest education level: Not on file  ?Occupational History  ? Not on file  ?Tobacco Use  ? Smoking status: Former  ?  Years: 12.00  ?  Types: Cigarettes  ?  Quit date: 08/24/2012  ?  Years since quitting: 9.4  ? Smokeless tobacco: Never  ?Vaping Use  ? Vaping Use: Never used  ?Substance and Sexual Activity  ? Alcohol use: No  ?  Alcohol/week: 0.0 standard drinks  ? Drug use: No  ? Sexual activity: Yes  ?  Birth control/protection: None  ?  Comment: Last encounter: [phone], Husband.  ?Other Topics Concern  ? Not on file  ?Social History Narrative  ? Not on file  ? ?Social Determinants of Health  ? ?Financial Resource Strain: Not on file  ?Food Insecurity: Not on file  ?Transportation Needs: Not on file  ?Physical Activity: Not on file  ?Stress: Not on file  ?Social Connections: Not on file  ?Intimate Partner Violence: Not on file  ? ?Social History  ? ?Tobacco Use  ?Smoking Status Former  ? Years: 12.00  ? Types: Cigarettes  ? Quit date: 08/24/2012  ? Years since quitting: 9.4  ?Smokeless Tobacco Never  ? ?Social History  ? ?Substance and Sexual Activity  ?Alcohol Use No  ? Alcohol/week: 0.0 standard  drinks  ? ? ?Family History:  ?Family History  ?Problem Relation Age of Onset  ? Diabetes Mother   ? Asthma Mother   ? Diabetes Father   ? Heart attack Father   ? Heart disease Father   ? Asthma Sister   ? Bipolar disorder Sister   ? Asthma Brother   ? Bipolar disorder Brother   ? Epilepsy Brother   ? ? ?Past medical history, surgical history, medications, allergies, family  history and social history reviewed with patient today and changes made to appropriate areas of the chart.  ? ?Review of Systems  ?Constitutional:  Positive for malaise/fatigue. Negative for chills, diaphoresis, fever and weight loss.  ?HENT: Negative.    ?Eyes: Negative.   ?Respiratory: Negative.    ?Cardiovascular: Negative.   ?Gastrointestinal:  Positive for heartburn, nausea and vomiting. Negative for abdominal pain, blood in stool, constipation, diarrhea and melena.  ?Genitourinary: Negative.   ?Musculoskeletal: Negative.   ?Skin: Negative.   ?Neurological: Negative.   ?Endo/Heme/Allergies:  Positive for environmental allergies. Negative for polydipsia. Does not bruise/bleed easily.  ?Psychiatric/Behavioral: Negative.    ?All other ROS negative except what is listed above and in the HPI.  ? ?   ?Objective:  ?  ?BP 134/75   Pulse 87   Temp 98.5 ?F (36.9 ?C)   Ht '5\' 7"'$  (1.702 m)   Wt 212 lb 6.4 oz (96.3 kg)   LMP 12/19/2021 (Approximate)   SpO2 98%   BMI 33.27 kg/m?   ?Wt Readings from Last 3 Encounters:  ?01/19/22 212 lb 6.4 oz (96.3 kg)  ?12/13/21 208 lb (94.3 kg)  ?11/04/21 208 lb 3.2 oz (94.4 kg)  ?  ?Physical Exam ?Vitals and nursing note reviewed.  ?Constitutional:   ?   General: She is not in acute distress. ?   Appearance: Normal appearance. She is not ill-appearing, toxic-appearing or diaphoretic.  ?HENT:  ?   Head: Normocephalic and atraumatic.  ?   Right Ear: Tympanic membrane, ear canal and external ear normal. There is no impacted cerumen.  ?   Left Ear: Tympanic membrane, ear canal and external ear normal. There is no  impacted cerumen.  ?   Nose: Nose normal. No congestion or rhinorrhea.  ?   Mouth/Throat:  ?   Mouth: Mucous membranes are moist.  ?   Pharynx: Oropharynx is clear. No oropharyngeal exudate or posterior oropharyngeal erythema.

## 2022-01-19 NOTE — Patient Instructions (Signed)
Please call to schedule your mammogram and/or bone density: ?[facility]  ?Address: [address], [place], [place] [address] ?Phone: [phone]  ?

## 2022-01-20 LAB — CBC WITH DIFFERENTIAL/PLATELET
Basophils Absolute: 0.1 10*3/uL (ref 0.0–0.2)
Basos: 1 %
EOS (ABSOLUTE): 0.1 10*3/uL (ref 0.0–0.4)
Eos: 1 %
Hematocrit: 36.8 % (ref 34.0–46.6)
Hemoglobin: 11.7 g/dL (ref 11.1–15.9)
Immature Grans (Abs): 0.1 10*3/uL (ref 0.0–0.1)
Immature Granulocytes: 1 %
Lymphocytes Absolute: 3.4 10*3/uL — ABNORMAL HIGH (ref 0.7–3.1)
Lymphs: 36 %
MCH: 24.7 pg — ABNORMAL LOW (ref 26.6–33.0)
MCHC: 31.8 g/dL (ref 31.5–35.7)
MCV: 78 fL — ABNORMAL LOW (ref 79–97)
Monocytes Absolute: 0.7 10*3/uL (ref 0.1–0.9)
Monocytes: 7 %
Neutrophils Absolute: 5.1 10*3/uL (ref 1.4–7.0)
Neutrophils: 54 %
Platelets: 523 10*3/uL — ABNORMAL HIGH (ref 150–450)
RBC: 4.73 x10E6/uL (ref 3.77–5.28)
RDW: 14.9 % (ref 11.7–15.4)
WBC: 9.5 10*3/uL (ref 3.4–10.8)

## 2022-01-20 LAB — COMPREHENSIVE METABOLIC PANEL
ALT: 9 IU/L (ref 0–32)
AST: 13 IU/L (ref 0–40)
Albumin/Globulin Ratio: 1.7 (ref 1.2–2.2)
Albumin: 4.2 g/dL (ref 3.8–4.8)
Alkaline Phosphatase: 102 IU/L (ref 44–121)
BUN/Creatinine Ratio: 11 (ref 9–23)
BUN: 7 mg/dL (ref 6–24)
Bilirubin Total: 0.2 mg/dL (ref 0.0–1.2)
CO2: 21 mmol/L (ref 20–29)
Calcium: 9.3 mg/dL (ref 8.7–10.2)
Chloride: 102 mmol/L (ref 96–106)
Creatinine, Ser: 0.63 mg/dL (ref 0.57–1.00)
Globulin, Total: 2.5 g/dL (ref 1.5–4.5)
Glucose: 93 mg/dL (ref 70–99)
Potassium: 4.7 mmol/L (ref 3.5–5.2)
Sodium: 138 mmol/L (ref 134–144)
Total Protein: 6.7 g/dL (ref 6.0–8.5)
eGFR: 115 mL/min/{1.73_m2} (ref >59)

## 2022-01-20 LAB — LIPID PANEL W/O CHOL/HDL RATIO
Cholesterol, Total: 225 mg/dL — ABNORMAL HIGH (ref 100–199)
HDL: 48 mg/dL (ref >39)
LDL Chol Calc (NIH): 129 mg/dL — ABNORMAL HIGH (ref 0–99)
Triglycerides: 268 mg/dL — ABNORMAL HIGH (ref 0–149)
VLDL Cholesterol Cal: 48 mg/dL — ABNORMAL HIGH (ref 5–40)

## 2022-01-20 LAB — TSH: TSH: 1.9 u[IU]/mL (ref 0.450–4.500)

## 2022-01-26 ENCOUNTER — Telehealth: Payer: Self-pay

## 2022-01-26 ENCOUNTER — Encounter: Payer: Self-pay | Admitting: Family Medicine

## 2022-01-26 NOTE — Telephone Encounter (Signed)
PA for Incruse initiated and submitted via Cover My Meds. Key: [id number] ?

## 2022-01-27 NOTE — Telephone Encounter (Signed)
PA for Incruse was denied. Denial letter does not state any alternatives that would be covered.  ? ?Called and spoke with patient. Asked for her to please contact her insurance and find out what alternatives are covered.  ?

## 2022-01-29 NOTE — Telephone Encounter (Signed)
Copied from [place] [id number]. Topic: General - Other ?>> Jan 28, 2022  4:53 PM Wynetta Emery, [place] C wrote: ?Reason for CRM: pt is calling in to speak back with [place]. Pt is requesting a call back at [phone] ?

## 2022-01-29 NOTE — Telephone Encounter (Signed)
Called and spoke with patient. She states that she has enough Incruse and does not need more.  ? ?While on the phone patient states that she needs her radiology records for her CT scans. Advised patient that she would need to come by to sign a release form to do so. Patient will be by in the morning to sign form. Records have already been printed and placed with the form. Patient instructed to fill out the form and then her records can be given.  ?

## 2022-02-10 ENCOUNTER — Encounter: Payer: Self-pay | Admitting: Internal Medicine

## 2022-02-10 ENCOUNTER — Ambulatory Visit (INDEPENDENT_AMBULATORY_CARE_PROVIDER_SITE_OTHER): Payer: 59 | Admitting: Internal Medicine

## 2022-02-10 ENCOUNTER — Ambulatory Visit
Admission: RE | Admit: 2022-02-10 | Discharge: 2022-02-10 | Disposition: A | Discharge status: Home / Self Care | Payer: 59 | Source: Home / Self Care | Attending: Internal Medicine | Admitting: Internal Medicine

## 2022-02-10 ENCOUNTER — Ambulatory Visit
Admission: RE | Admit: 2022-02-10 | Discharge: 2022-02-10 | Disposition: A | Discharge status: Home / Self Care | Payer: 59 | Source: Ambulatory Visit | Attending: Internal Medicine | Admitting: Internal Medicine

## 2022-02-10 VITALS — BP 144/91 | HR 101 | Temp 98.4°F | Ht 67.01 in | Wt 214.4 lb

## 2022-02-10 DIAGNOSIS — M25571 Pain in right ankle and joints of right foot: Secondary | ICD-10-CM

## 2022-02-10 DIAGNOSIS — G8929 Other chronic pain: Secondary | ICD-10-CM

## 2022-02-10 MED ORDER — MELOXICAM 15 MG PO TABS
15.0000 mg | ORAL_TABLET | Freq: Every day | ORAL | 0 refills | Status: AC
Start: 2022-02-10 — End: 2022-02-24

## 2022-02-10 MED ORDER — DICLOFENAC SODIUM 1 % EX GEL
2.0000 g | Freq: Four times a day (QID) | CUTANEOUS | 1 refills | Status: DC
Start: 2022-02-10 — End: 2022-03-09

## 2022-02-10 NOTE — Progress Notes (Signed)
? ?BP (!) 144/91   Pulse (!) 101   Temp 98.4 ?F (36.9 ?C) (Oral)   Ht 5' 7.01" (1.702 m)   Wt 214 lb 6.4 oz (97.3 kg)   SpO2 97%   BMI 33.57 kg/m?   ? ?Subjective:  ? ? Patient ID: Ashley Kidd, female    DOB: [birth year], 41 y.o.   MRN: [id number] ? ?Chief Complaint  ?Patient presents with  ?? Ankle Pain  ?  Right ankle pain, started for the past few days  ? ? ?HPI: ?Ashley Kidd is a 41 y.o. female ? ?Ankle Pain  ?The incident occurred more than 1 week ago (twisted the ankle a month ago , sprians all the time did RICE bracing makes pain worse). The injury mechanism was a twisting injury. The quality of the pain is described as aching. The pain is at a severity of 4/10. The pain is moderate. The pain has been Fluctuating since onset. Associated symptoms include numbness. Pertinent negatives include no inability to bear weight, loss of motion, loss of sensation, muscle weakness or tingling. She has tried elevation, ice and non-weight bearing for the symptoms.  ? ?Chief Complaint  ?Patient presents with  ?? Ankle Pain  ?  Right ankle pain, started for the past few days  ? ? ?Relevant past medical, surgical, family and social history reviewed and updated as indicated. Interim medical history since our last visit reviewed. ?Allergies and medications reviewed and updated. ? ?Review of Systems  ?Neurological:  Positive for numbness. Negative for tingling.  ? ?Per HPI unless specifically indicated above ? ?   ?Objective:  ?  ?BP (!) 144/91   Pulse (!) 101   Temp 98.4 ?F (36.9 ?C) (Oral)   Ht 5' 7.01" (1.702 m)   Wt 214 lb 6.4 oz (97.3 kg)   SpO2 97%   BMI 33.57 kg/m?   ?Wt Readings from Last 3 Encounters:  ?02/10/22 214 lb 6.4 oz (97.3 kg)  ?01/19/22 212 lb 6.4 oz (96.3 kg)  ?12/13/21 208 lb (94.3 kg)  ?  ?Physical Exam ?Vitals and nursing note reviewed.  ?Constitutional:   ?   General: She is not in acute distress. ?   Appearance: Normal appearance. She is not diaphoretic.  ?Eyes:  ?    Conjunctiva/sclera: Conjunctivae normal.  ?Pulmonary:  ?   Breath sounds: No rhonchi.  ?Musculoskeletal:     ?   General: Tenderness present. No swelling, deformity or signs of injury.  ?   Right lower leg: No edema.  ?   Left lower leg: No edema.  ?Skin: ?   General: Skin is warm and dry.  ?   Coloration: Skin is not jaundiced.  ?   Findings: No erythema.  ?Neurological:  ?   Mental Status: She is alert.  ? ? ?Results for orders placed or performed in visit on 01/19/22  ?CBC with Differential/Platelet  ?Result Value Ref Range  ? WBC 9.5 3.4 - 10.8 x10E3/uL  ? RBC 4.73 3.77 - 5.28 x10E6/uL  ? Hemoglobin 11.7 11.1 - 15.9 g/dL  ? Hematocrit 36.8 34.0 - 46.6 %  ? MCV 78 (L) 79 - 97 fL  ? MCH 24.7 (L) 26.6 - 33.0 pg  ? MCHC 31.8 31.5 - 35.7 g/dL  ? RDW 14.9 11.7 - 15.4 %  ? Platelets 523 (H) 150 - 450 x10E3/uL  ? Neutrophils 54 Not Estab. %  ? Lymphs 36 Not Estab. %  ? Monocytes 7 Not Estab. %  ?  Eos 1 Not Estab. %  ? Basos 1 Not Estab. %  ? Neutrophils Absolute 5.1 1.4 - 7.0 x10E3/uL  ? Lymphocytes Absolute 3.4 (H) 0.7 - 3.1 x10E3/uL  ? Monocytes Absolute 0.7 0.1 - 0.9 x10E3/uL  ? EOS (ABSOLUTE) 0.1 0.0 - 0.4 x10E3/uL  ? Basophils Absolute 0.1 0.0 - 0.2 x10E3/uL  ? Immature Granulocytes 1 Not Estab. %  ? Immature Grans (Abs) 0.1 0.0 - 0.1 x10E3/uL  ?Comprehensive metabolic panel  ?Result Value Ref Range  ? Glucose 93 70 - 99 mg/dL  ? BUN 7 6 - 24 mg/dL  ? Creatinine, Ser 0.63 0.57 - 1.00 mg/dL  ? eGFR 115 >59 mL/min/1.73  ? BUN/Creatinine Ratio 11 9 - 23  ? Sodium 138 134 - 144 mmol/L  ? Potassium 4.7 3.5 - 5.2 mmol/L  ? Chloride 102 96 - 106 mmol/L  ? CO2 21 20 - 29 mmol/L  ? Calcium 9.3 8.7 - 10.2 mg/dL  ? Total Protein 6.7 6.0 - 8.5 g/dL  ? Albumin 4.2 3.8 - 4.8 g/dL  ? Globulin, Total 2.5 1.5 - 4.5 g/dL  ? Albumin/Globulin Ratio 1.7 1.2 - 2.2  ? Bilirubin Total 0.2 0.0 - 1.2 mg/dL  ? Alkaline Phosphatase 102 44 - 121 IU/L  ? AST 13 0 - 40 IU/L  ? ALT 9 0 - 32 IU/L  ?Lipid Panel w/o Chol/HDL Ratio  ?Result Value Ref  Range  ? Cholesterol, Total 225 (H) 100 - 199 mg/dL  ? Triglycerides 268 (H) 0 - 149 mg/dL  ? HDL 48 >39 mg/dL  ? VLDL Cholesterol Cal 48 (H) 5 - 40 mg/dL  ? LDL Chol Calc (NIH) 129 (H) 0 - 99 mg/dL  ?Urinalysis, Routine w reflex microscopic  ?Result Value Ref Range  ? Specific Gravity, UA 1.015 1.005 - 1.030  ? pH, UA 6.5 5.0 - 7.5  ? Color, UA Yellow Yellow  ? Appearance Ur Clear Clear  ? Leukocytes,UA Negative Negative  ? Protein,UA Negative Negative/Trace  ? Glucose, UA Negative Negative  ? Ketones, UA Negative Negative  ? RBC, UA Negative Negative  ? Bilirubin, UA Negative Negative  ? Urobilinogen, Ur 0.2 0.2 - 1.0 mg/dL  ? Nitrite, UA Negative Negative  ?TSH  ?Result Value Ref Range  ? TSH 1.900 0.450 - 4.500 uIU/mL  ?Bayer DCA Hb A1c Waived  ?Result Value Ref Range  ? HB A1C (BAYER DCA - WAIVED) 6.4 (H) 4.8 - 5.6 %  ?Microalbumin, Urine Waived  ?Result Value Ref Range  ? Microalb, Ur Waived 10 0 - 19 mg/L  ? Creatinine, Urine Waived 10 10 - 300 mg/dL  ? Microalb/Creat Ratio <30 <30 mg/g  ? ?   ? ? ?Current Outpatient Medications:  ??  albuterol (VENTOLIN HFA) 108 (90 Base) MCG/ACT inhaler, INHALE 2 PUFFS INTO THE LUNGS EVERY 6 HOURS AS NEEDED FOR WHEEZING, Disp: 18 g, Rfl: 6 ??  aspirin 81 MG tablet, Take 81 mg by mouth daily., Disp: , Rfl:  ??  cetirizine (ZYRTEC) 10 MG tablet, Take 10 mg by mouth daily., Disp: , Rfl:  ??  citalopram (CELEXA) 20 MG tablet, Take 1 tablet (20 mg total) by mouth daily., Disp: 90 tablet, Rfl: 1 ??  diclofenac Sodium (VOLTAREN) 1 % GEL, Apply 2 g topically 4 (four) times daily., Disp: 2 g, Rfl: 1 ??  EPINEPHrine 0.3 mg/0.3 mL IJ SOAJ injection, INJ 0.3 ML IM ONCE FOR 1 DOSE, Disp: 2 each, Rfl: 12 ??  fluticasone (FLONASE) 50 MCG/ACT nasal  spray, INSTILL 2 SPRAYS IN EACH NOSTRIL EVERY DAY, Disp: 16 g, Rfl: 12 ??  meloxicam (MOBIC) 15 MG tablet, Take 1 tablet (15 mg total) by mouth daily for 14 days., Disp: 14 tablet, Rfl: 0 ??  montelukast (SINGULAIR) 10 MG tablet, Take 1 tablet  (10 mg total) by mouth at bedtime., Disp: 90 tablet, Rfl: 1 ??  omeprazole (PRILOSEC) 40 MG capsule, Take 1 capsule (40 mg total) by mouth daily., Disp: 90 capsule, Rfl: 1 ??  Probiotic Product (PROBIOTIC DAILY PO), Take 1 capsule by mouth daily., Disp: , Rfl:  ??  promethazine (PHENERGAN) 25 MG tablet, Take 1 tablet (25 mg total) by mouth every 6 (six) hours as needed for nausea or vomiting., Disp: 20 tablet, Rfl: 0 ??  SUMAtriptan (IMITREX) 100 MG tablet, Take 1 tablet (100 mg total) by mouth every 2 (two) hours as needed for migraine. One tab at onset and may repeat x1 in 1 hour. Max 200 mg/24 hours, Disp: 10 tablet, Rfl: 12 ??  umeclidinium bromide (INCRUSE ELLIPTA) 62.5 MCG/ACT AEPB, Inhale 1 puff into the lungs daily., Disp: 30 each, Rfl: 12 ??  ZOVIA 1/35E, 28, 1-35 MG-MCG tablet, Take by mouth daily., Disp: , Rfl:   ? ? ?Assessment & Plan:  ?Ankle pain 4/10 today ?Will start pt on mobic x 2 weeks to fu with [place] If worsens.  ?Will send off voltaren gel for pain as well ?Check xray of the ankle  ?Fu as needed.  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 To keep lower ext elevated. ? ? ?Problem List Items Addressed This Visit   ? ?  ? Other  ? Chronic pain of right ankle - Primary  ? Relevant Medications  ? meloxicam (MOBIC) 15 MG tablet  ? Other Relevant Orders  ? DG Ankle Complete Right  ?  ? ?Orders Placed This Encounter  ?Procedures  ?? DG Ankle Complete Right  ?  ? ?Meds ordered this encounter  ?Medications  ?? meloxicam (MOBIC) 15 MG tablet  ?  Sig: Take 1 tablet (15 mg total) by mouth daily for 14 days.  ?  Dispense:  14 tablet  ?  Refill:  0  ?? diclofenac Sodium (VOLTAREN) 1 % GEL  ?  Sig: Apply 2 g topically 4 (four) times daily.  ?  Dispense:  2 g  ?  Refill:  1  ?  ? ?Follow up plan: ?Return in about 3 weeks (around 03/03/2022). ? ? ? ?

## 2022-02-10 NOTE — Progress Notes (Signed)
Please let pt know this was normal.

## 2022-02-12 ENCOUNTER — Telehealth: Payer: Self-pay

## 2022-02-12 MED ORDER — SPIRIVA HANDIHALER 18 MCG IN CAPS: 18.0000 ug | ORAL_CAPSULE | Freq: Every day | RESPIRATORY_TRACT | 12 refills | Status: DC

## 2022-02-12 NOTE — Telephone Encounter (Signed)
Patient called in and stated that she spoke with her insurance company regarding her inhaler. She states that they will cover Spiriva or Handihaler. Can we send in one of these for the patient?

## 2022-02-16 ENCOUNTER — Ambulatory Visit: Admission: RE | Admit: 2022-02-16 | Payer: 59 | Source: Ambulatory Visit

## 2022-02-22 ENCOUNTER — Other Ambulatory Visit: Payer: Self-pay | Admitting: Internal Medicine

## 2022-02-24 NOTE — Telephone Encounter (Signed)
Requested medications are due for refill today.  unsure  Requested medications are on the active medications list.  yes  Last refill. 02/10/2022 #14 0 refills  Future visit scheduled.   yes  Notes to clinic.  Rx expires today ,02/24/2022. Unsure if pt is to continue medication.    Requested Prescriptions  Pending Prescriptions Disp Refills   meloxicam (MOBIC) 15 MG tablet [Pharmacy Med Name: MELOXICAM 15MG TABLETS] 14 tablet 0    Sig: TAKE 1 TABLET(15 MG) BY MOUTH DAILY FOR 14 DAYS     Analgesics:  COX2 Inhibitors Failed - 02/22/2022  9:49 AM      Failed - Manual Review: Labs are only required if the patient has taken medication for more than 8 weeks.      Passed - HGB in normal range and within 360 days    Hemoglobin  Date Value Ref Range Status  01/19/2022 11.7 11.1 - 15.9 g/dL Final         Passed - Cr in normal range and within 360 days    Creatinine, Ser  Date Value Ref Range Status  01/19/2022 0.63 0.57 - 1.00 mg/dL Final         Passed - HCT in normal range and within 360 days    Hematocrit  Date Value Ref Range Status  01/19/2022 36.8 34.0 - 46.6 % Final         Passed - AST in normal range and within 360 days    AST  Date Value Ref Range Status  01/19/2022 13 0 - 40 IU/L Final         Passed - ALT in normal range and within 360 days    ALT  Date Value Ref Range Status  01/19/2022 9 0 - 32 IU/L Final         Passed - eGFR is 30 or above and within 360 days    GFR calc Af Amer  Date Value Ref Range Status  07/18/2020 127 >59 mL/min/1.73 Final    Comment:    **In accordance with recommendations from the NKF-ASN Task force,**   [facility] is in the process of updating its eGFR calculation to the   2021 CKD-EPI creatinine equation that estimates kidney function   without a race variable.    GFR, Estimated  Date Value Ref Range Status  05/23/2021 >60 >60 mL/min Final    Comment:    (NOTE) Calculated using the CKD-EPI Creatinine Equation (2021)     eGFR  Date Value Ref Range Status  01/19/2022 115 >59 mL/min/1.73 Final         Passed - Patient is not pregnant      Passed - Valid encounter within last 12 months    Recent Outpatient Visits           2 weeks ago Chronic pain of right ankle   [place], MD   1 month ago Routine general medical examination at a health care facility   [facility], [place], DO   3 months ago Type 2 diabetes mellitus without complication, without long-term current use of insulin ([place])   [place], Megan P, DO   4 months ago Viral upper respiratory tract infection   [place], NP   4 months ago Acute pain of right knee   [facility] Charlynne Cousins, MD       Future Appointments  In 1 week Wynetta Emery, Barb Merino, DO [facility], [place]   In 4 months Wynetta Emery, Barb Merino, DO [facility], [facility]

## 2022-02-25 ENCOUNTER — Ambulatory Visit
Admission: RE | Admit: 2022-02-25 | Discharge: 2022-02-25 | Disposition: A | Discharge status: Home / Self Care | Payer: 59 | Source: Ambulatory Visit | Attending: Family Medicine | Admitting: Family Medicine

## 2022-02-25 DIAGNOSIS — Z1231 Encounter for screening mammogram for malignant neoplasm of breast: Secondary | ICD-10-CM | POA: Diagnosis present

## 2022-02-27 LAB — HM DIABETES EYE EXAM

## 2022-03-06 ENCOUNTER — Other Ambulatory Visit: Payer: Self-pay

## 2022-03-06 ENCOUNTER — Emergency Department
Admission: EM | Admit: 2022-03-06 | Discharge: 2022-03-06 | Disposition: A | Discharge status: Home / Self Care | Payer: 59 | Attending: Emergency Medicine | Admitting: Emergency Medicine

## 2022-03-06 ENCOUNTER — Emergency Department: Payer: 59

## 2022-03-06 ENCOUNTER — Encounter: Payer: Self-pay | Admitting: Emergency Medicine

## 2022-03-06 DIAGNOSIS — Z23 Encounter for immunization: Secondary | ICD-10-CM | POA: Insufficient documentation

## 2022-03-06 DIAGNOSIS — S51851A Open bite of right forearm, initial encounter: Secondary | ICD-10-CM | POA: Insufficient documentation

## 2022-03-06 DIAGNOSIS — W540XXA Bitten by dog, initial encounter: Secondary | ICD-10-CM | POA: Diagnosis not present

## 2022-03-06 DIAGNOSIS — S31153A Open bite of abdominal wall, right lower quadrant without penetration into peritoneal cavity, initial encounter: Secondary | ICD-10-CM | POA: Diagnosis not present

## 2022-03-06 MED ORDER — METRONIDAZOLE 500 MG PO TABS
500.0000 mg | ORAL_TABLET | Freq: Once | ORAL | Status: AC
  Administered 2022-03-06: 500 mg via ORAL
  Filled 2022-03-06: qty 1

## 2022-03-06 MED ORDER — TETANUS-DIPHTH-ACELL PERTUSSIS 5-2.5-18.5 LF-MCG/0.5 IM SUSY
0.5000 mL | PREFILLED_SYRINGE | Freq: Once | INTRAMUSCULAR | Status: AC
  Administered 2022-03-06: 0.5 mL via INTRAMUSCULAR
  Filled 2022-03-06: qty 0.5

## 2022-03-06 MED ORDER — OXYCODONE-ACETAMINOPHEN 5-325 MG PO TABS
2.0000 | ORAL_TABLET | Freq: Once | ORAL | Status: AC
  Administered 2022-03-06: 2 via ORAL
  Filled 2022-03-06: qty 2

## 2022-03-06 MED ORDER — OXYCODONE-ACETAMINOPHEN 5-325 MG PO TABS
1.0000 | ORAL_TABLET | Freq: Four times a day (QID) | ORAL | 0 refills | Status: DC | PRN
Start: 2022-03-06 — End: 2022-03-09

## 2022-03-06 MED ORDER — AMOXICILLIN-POT CLAVULANATE 875-125 MG PO TABS
1.0000 | ORAL_TABLET | Freq: Two times a day (BID) | ORAL | 0 refills | Status: AC
Start: 2022-03-06 — End: 2022-03-16

## 2022-03-06 MED ORDER — CEFTRIAXONE SODIUM 1 G IJ SOLR
1.0000 g | Freq: Once | INTRAMUSCULAR | Status: AC
  Administered 2022-03-06: 1 g via INTRAMUSCULAR
  Filled 2022-03-06: qty 10

## 2022-03-06 NOTE — ED Notes (Signed)
DC ppw provided to patient. Pt questions, followup and rx information reviewed. Pt decline vs at time of dc and sent to lobby alert and oriented at this time

## 2022-03-06 NOTE — ED Notes (Signed)
Pt wounds cleaned and dressed with betadine, NaCl and gauze bandages

## 2022-03-06 NOTE — ED Provider Notes (Addendum)
[facility] Provider Note    Event Date/Time   First MD Initiated Contact with Patient 03/06/22 1107     (approximate)   History   Animal Bite   HPI  Ashley Kidd is a 41 y.o. female here with dog bite to the right forearm and right abdomen.  The patient had a Fransisco Beau that was rescued approximately a week ago.  It was rescued from the pound.  Shots are up-to-date.  Today, the dog reportedly attacked the patient, grabbing onto her right arm as well as her right abdomen.  He held on and was very difficult to get off.  She sustained multiple bruises as well as puncture wounds to the right forearm as well as a abrasion and bruise to the right abdomen.  Denies any numbness or weakness.  No known dislodgment of teeth from today on the dog.  Dog has subsequently been placed with animal control.  Dog vaccines are up-to-date as mentioned.     Physical Exam   Triage Vital Signs: ED Triage Vitals [03/06/22 1034]  Enc Vitals Group     BP (!) 160/87     Pulse Rate (!) 102     Resp 20     Temp (!) 97.5 F (36.4 C)     Temp Source Oral     SpO2 95 %     Weight 208 lb (94.3 kg)     Height '5\' 7"'$  (1.702 m)     Head Circumference      Peak Flow      Pain Score 3     Pain Loc      Pain Edu?      Excl. in [place]?     Most recent vital signs: Vitals:   03/06/22 1034  BP: (!) 160/87  Pulse: (!) 102  Resp: 20  Temp: (!) 97.5 F (36.4 C)  SpO2: 95%     General: Awake, no distress.  CV:  Good peripheral perfusion.  Resp:  Normal effort.  Abd:  No distention.  Other:  Multiple superficial, small puncture wounds to the right forearm, no deep lacerations.  No gaping wounds.  There is significant ecchymosis and bruising around several bite sites throughout the right forearm.  Distal strength and sensation is intact.  Compartments are soft.  No pain with passive extension or flexion.  Superficial abrasions and some superficial ecchymoses to the right lower  abdominal wall.  Abdomen is otherwise soft and nontender.  No peritonitis.  No deep wounds of the abdomen.   ED Results / Procedures / Treatments   Labs (all labs ordered are listed, but only abnormal results are displayed) Labs Reviewed - No data to display   EKG    RADIOLOGY DG forearm:No FB or fx   I also independently reviewed and agree with radiologist interpretations.   PROCEDURES:  Critical Care performed: No    MEDICATIONS ORDERED IN ED: Medications  Tdap (BOOSTRIX) injection 0.5 mL (0.5 mLs Intramuscular Given 03/06/22 1136)  cefTRIAXone (ROCEPHIN) injection 1 g (1 g Intramuscular Given 03/06/22 1131)  metroNIDAZOLE (FLAGYL) tablet 500 mg (500 mg Oral Given 03/06/22 1135)  oxyCODONE-acetaminophen (PERCOCET/ROXICET) 5-325 MG per tablet 2 tablet (2 tablets Oral Given 03/06/22 1136)     IMPRESSION / MDM / ASSESSMENT AND PLAN / ED COURSE  I reviewed the triage vital signs and the nursing notes.  Ddx:  Differential includes the following, with pertinent life- or limb-threatening emergencies considered:  Animal bite with superficial ecchymoses and local skin trauma, possible retained foreign body, less likely bony injury  Patient's presentation is most consistent with acute presentation with potential threat to life or bodily function.  MDM:  41 year old female here with significant bruising and superficial wounds to the right forearm as well as a small spot on the right abdominal wall after being attacked by a dog.  Plain films show no retained foreign bodies or bony injuries.  Wounds were thoroughly cleaned and dressed in the ED with Betadine and sterile dressings.  Patient tetanus up-to-date.  Patient does have diabetes, though not insulin-dependent.  She was given an IM dose of Rocephin and Flagyl here, and will be discharged on Augmentin for broad-spectrum coverage.  Signs and symptoms of worsening infection discussed in detail.   Compartments are soft.  Will discharge with outpatient follow-up as needed. Advised elevation, ice for 24 hours.   MEDICATIONS GIVEN IN ED: Medications  Tdap (BOOSTRIX) injection 0.5 mL (0.5 mLs Intramuscular Given 03/06/22 1136)  cefTRIAXone (ROCEPHIN) injection 1 g (1 g Intramuscular Given 03/06/22 1131)  metroNIDAZOLE (FLAGYL) tablet 500 mg (500 mg Oral Given 03/06/22 1135)  oxyCODONE-acetaminophen (PERCOCET/ROXICET) 5-325 MG per tablet 2 tablet (2 tablets Oral Given 03/06/22 1136)     Consults:  None   EMR reviewed  Prior ED visits, PCP visits for routine follow-up and COPD/asthma with Dr. Neomia Dear     FINAL CLINICAL IMPRESSION(S) / ED DIAGNOSES   Final diagnoses:  Dog bite, initial encounter  Bite wound of right forearm, initial encounter     Rx / DC Orders   ED Discharge Orders          Ordered    amoxicillin-clavulanate (AUGMENTIN) 875-125 MG tablet  2 times daily        03/06/22 1125    oxyCODONE-acetaminophen (PERCOCET) 5-325 MG tablet  Every 6 hours PRN        03/06/22 1125             Note:  This document was prepared using Dragon voice recognition software and may include unintentional dictation errors.   Duffy Bruce, MD 03/06/22 Hart Carwin    Duffy Bruce, MD 03/06/22 [phone]

## 2022-03-06 NOTE — ED Notes (Signed)
See triage note  presents s/p dog bite  states she was leaving to go to work and she was bitten  has small bite to  right lateral abd   has bites and abrasions to right forearm

## 2022-03-06 NOTE — ED Triage Notes (Signed)
Pt here with an animal bite to her right forearm. Pt has only had the dog for a week. Pt states the animal has had all of his shots, dog has been returned to the pound. Pt has bites to the forearm and her right abd.

## 2022-03-06 NOTE — Discharge Instructions (Addendum)
Keep the wound clean and dry.  You can apply ice to the areas of bruising for the next 24 hours to help with pain.  Try to elevate the arm above the level of the heart.  Take the antibiotics as prescribed.  You can start these today.  You were given an IM dose of medications here in the ED as well.  Monitor for any signs of worsening redness, pain, drainage, or other signs of infection.  Follow-up with your PCP in 48 to 72 hours for wound check.

## 2022-03-09 ENCOUNTER — Ambulatory Visit: Payer: 59 | Admitting: Family Medicine

## 2022-03-09 ENCOUNTER — Encounter: Payer: Self-pay | Admitting: Family Medicine

## 2022-03-09 VITALS — BP 136/80 | HR 88 | Temp 97.8°F | Wt 217.4 lb

## 2022-03-09 DIAGNOSIS — L231 Allergic contact dermatitis due to adhesives: Secondary | ICD-10-CM | POA: Diagnosis not present

## 2022-03-09 DIAGNOSIS — W540XXD Bitten by dog, subsequent encounter: Secondary | ICD-10-CM | POA: Diagnosis not present

## 2022-03-09 DIAGNOSIS — G8929 Other chronic pain: Secondary | ICD-10-CM

## 2022-03-09 DIAGNOSIS — M25571 Pain in right ankle and joints of right foot: Secondary | ICD-10-CM

## 2022-03-09 MED ORDER — TRIAMCINOLONE ACETONIDE 0.1 % EX CREA: 1.0000 "application " | TOPICAL_CREAM | Freq: Two times a day (BID) | CUTANEOUS | 0 refills | Status: DC

## 2022-03-09 NOTE — Assessment & Plan Note (Signed)
X-ray normal. Has done PT several times in the past and has been doing home exercised for >6 weeks without any benefit. Pain is worsening. Will get her in for MRI of the ankle. Concern for instability and tear of the deltoid ligament which may need surgery. Await results and refer her as needed. Call with any concerns.

## 2022-03-09 NOTE — Progress Notes (Signed)
BP 136/80   Pulse 88   Temp 97.8 F (36.6 C)   Wt 217 lb 6.4 oz (98.6 kg)   LMP 02/18/2022   SpO2 98%   BMI 34.05 kg/m    Subjective:    Patient ID: Ashley Kidd, female    DOB: [birth year], 41 y.o.   MRN: [id number]  HPI: Ashley Kidd is a 41 y.o. female  Chief Complaint  Patient presents with   Ankle Pain    Patient states her right ankle pain is still consistent. States it is not everyday but the pain not shot from the back of her heel up her leg when she walks. Patient was seen by Dr. Neomia Dear on 02/10/22 and was told to follow up with PCP    Animal Bite    Patient was seen In ED for dog bite on right arm and abdomen. Patient states the bite on her abdomen is black and blue and swollen, and states its painful. Patient is currently on antibiotics for dog bite.    SKIN INFECTION- bit by a Dog on Friday. Has been taking her augmentin. Used her pain medicine 2x but didn't like how it made her feel, so she stopped it Duration:  4 days Location: R arm and R side of her abdomen History of trauma in area: yes Pain: yes Quality: aching and sore Severity: moderate Redness: no Swelling: yes Oozing: no Pus: no Fevers: no Nausea/vomiting: no Status: stable Treatments attempted:antibiotics  Tetanus: UTD   ANKLE PAIN Duration: chronic, has been going on for years but significantly worse in the past month and a half Involved foot: right Mechanism of injury:  has sprained it multiple times, but nothing this time. Location: medial just behind her medial maleolus Onset: sudden  Severity: severe  Quality:  aching, sharp and severe Frequency: constant Radiation: yes- up her ankle and back of her calf Aggravating factors: weight bearing, walking, running, stairs, and movement  Alleviating factors: ice, physical therapy, HEP, APAP, NSAIDs, brace, crutches, and rest  Status: worse Treatments attempted: rest, ice, heat, APAP, ibuprofen, aleve, physical therapy, and HEP  Relief  with NSAIDs?:  no Weakness with weight bearing or walking: yes Morning stiffness: no Swelling: yes Redness: no Bruising: yes Paresthesias / decreased sensation: no  Fevers:no  Relevant past medical, surgical, family and social history reviewed and updated as indicated. Interim medical history since our last visit reviewed. Allergies and medications reviewed and updated.  Review of Systems  Constitutional: Negative.   Respiratory: Negative.    Cardiovascular: Negative.   Gastrointestinal: Negative.   Musculoskeletal:  Positive for arthralgias and joint swelling. Negative for back pain, gait problem, myalgias, neck pain and neck stiffness.  Skin: Negative.   Neurological: Negative.   Psychiatric/Behavioral: Negative.      Per HPI unless specifically indicated above     Objective:    BP 136/80   Pulse 88   Temp 97.8 F (36.6 C)   Wt 217 lb 6.4 oz (98.6 kg)   LMP 02/18/2022   SpO2 98%   BMI 34.05 kg/m   Wt Readings from Last 3 Encounters:  03/09/22 217 lb 6.4 oz (98.6 kg)  03/06/22 208 lb (94.3 kg)  02/10/22 214 lb 6.4 oz (97.3 kg)    Physical Exam Vitals and nursing note reviewed.  Constitutional:      General: She is not in acute distress.    Appearance: Normal appearance. She is obese. She is not ill-appearing, toxic-appearing or diaphoretic.  HENT:  Head: Normocephalic and atraumatic.     Right Ear: External ear normal.     Left Ear: External ear normal.     Nose: Nose normal.     Mouth/Throat:     Mouth: Mucous membranes are moist.     Pharynx: Oropharynx is clear.  Eyes:     General: No scleral icterus.       Right eye: No discharge.        Left eye: No discharge.     Extraocular Movements: Extraocular movements intact.     Conjunctiva/sclera: Conjunctivae normal.     Pupils: Pupils are equal, round, and reactive to light.  Cardiovascular:     Rate and Rhythm: Normal rate and regular rhythm.     Pulses: Normal pulses.     Heart sounds: Normal  heart sounds. No murmur heard.    No friction rub. No gallop.  Pulmonary:     Effort: Pulmonary effort is normal. No respiratory distress.     Breath sounds: Normal breath sounds. No stridor. No wheezing, rhonchi or rales.  Chest:     Chest wall: No tenderness.  Musculoskeletal:        General: Swelling and tenderness (swelling and tenderness posterior to R medial maleolus, antalgic gait) present.     Cervical back: Normal range of motion and neck supple.  Skin:    General: Skin is warm and dry.     Capillary Refill: Capillary refill takes less than 2 seconds.     Coloration: Skin is not jaundiced or pale.     Findings: Bruising (bruising and wound to R side of abdomen where dog bit her. no redness or heat.) and erythema (irritated skin under plastic tape around dressing on abdomen) present. No lesion or rash.  Neurological:     General: No focal deficit present.     Mental Status: She is alert and oriented to person, place, and time. Mental status is at baseline.  Psychiatric:        Mood and Affect: Mood normal.        Behavior: Behavior normal.        Thought Content: Thought content normal.        Judgment: Judgment normal.     Results for orders placed or performed in visit on 01/19/22  CBC with Differential/Platelet  Result Value Ref Range   WBC 9.5 3.4 - 10.8 x10E3/uL   RBC 4.73 3.77 - 5.28 x10E6/uL   Hemoglobin 11.7 11.1 - 15.9 g/dL   Hematocrit 36.8 34.0 - 46.6 %   MCV 78 (L) 79 - 97 fL   MCH 24.7 (L) 26.6 - 33.0 pg   MCHC 31.8 31.5 - 35.7 g/dL   RDW 14.9 11.7 - 15.4 %   Platelets 523 (H) 150 - 450 x10E3/uL   Neutrophils 54 Not Estab. %   Lymphs 36 Not Estab. %   Monocytes 7 Not Estab. %   Eos 1 Not Estab. %   Basos 1 Not Estab. %   Neutrophils Absolute 5.1 1.4 - 7.0 x10E3/uL   Lymphocytes Absolute 3.4 (H) 0.7 - 3.1 x10E3/uL   Monocytes Absolute 0.7 0.1 - 0.9 x10E3/uL   EOS (ABSOLUTE) 0.1 0.0 - 0.4 x10E3/uL   Basophils Absolute 0.1 0.0 - 0.2 x10E3/uL    Immature Granulocytes 1 Not Estab. %   Immature Grans (Abs) 0.1 0.0 - 0.1 x10E3/uL  Comprehensive metabolic panel  Result Value Ref Range   Glucose 93 70 - 99 mg/dL   BUN 7  6 - 24 mg/dL   Creatinine, Ser 0.63 0.57 - 1.00 mg/dL   eGFR 115 >59 mL/min/1.73   BUN/Creatinine Ratio 11 9 - 23   Sodium 138 134 - 144 mmol/L   Potassium 4.7 3.5 - 5.2 mmol/L   Chloride 102 96 - 106 mmol/L   CO2 21 20 - 29 mmol/L   Calcium 9.3 8.7 - 10.2 mg/dL   Total Protein 6.7 6.0 - 8.5 g/dL   Albumin 4.2 3.8 - 4.8 g/dL   Globulin, Total 2.5 1.5 - 4.5 g/dL   Albumin/Globulin Ratio 1.7 1.2 - 2.2   Bilirubin Total 0.2 0.0 - 1.2 mg/dL   Alkaline Phosphatase 102 44 - 121 IU/L   AST 13 0 - 40 IU/L   ALT 9 0 - 32 IU/L  Lipid Panel w/o Chol/HDL Ratio  Result Value Ref Range   Cholesterol, Total 225 (H) 100 - 199 mg/dL   Triglycerides 268 (H) 0 - 149 mg/dL   HDL 48 >39 mg/dL   VLDL Cholesterol Cal 48 (H) 5 - 40 mg/dL   LDL Chol Calc (NIH) 129 (H) 0 - 99 mg/dL  Urinalysis, Routine w reflex microscopic  Result Value Ref Range   Specific Gravity, UA 1.015 1.005 - 1.030   pH, UA 6.5 5.0 - 7.5   Color, UA Yellow Yellow   Appearance Ur Clear Clear   Leukocytes,UA Negative Negative   Protein,UA Negative Negative/Trace   Glucose, UA Negative Negative   Ketones, UA Negative Negative   RBC, UA Negative Negative   Bilirubin, UA Negative Negative   Urobilinogen, Ur 0.2 0.2 - 1.0 mg/dL   Nitrite, UA Negative Negative  TSH  Result Value Ref Range   TSH 1.900 0.450 - 4.500 uIU/mL  Bayer DCA Hb A1c Waived  Result Value Ref Range   HB A1C (BAYER DCA - WAIVED) 6.4 (H) 4.8 - 5.6 %  Microalbumin, Urine Waived  Result Value Ref Range   Microalb, Ur Waived 10 0 - 19 mg/L   Creatinine, Urine Waived 10 10 - 300 mg/dL   Microalb/Creat Ratio <30 <30 mg/g      Assessment & Plan:   Problem List Items Addressed This Visit       Other   Chronic pain of right ankle - Primary    X-ray normal. Has done PT several times  in the past and has been doing home exercised for >6 weeks without any benefit. Pain is worsening. Will get her in for MRI of the ankle. Concern for instability and tear of the deltoid ligament which may need surgery. Await results and refer her as needed. Call with any concerns.       Relevant Orders   MR ANKLE RIGHT WO CONTRAST   Other Visit Diagnoses     Allergic contact dermatitis due to adhesives       Triamcinalone sent to pharmacy. Call with any concerns.    Dog bite, subsequent encounter       Doing well on augmentin. Finish abx. Call with any concerns.         Follow up plan: Return As scheduled.

## 2022-03-17 ENCOUNTER — Ambulatory Visit
Admission: RE | Admit: 2022-03-17 | Discharge: 2022-03-17 | Disposition: A | Discharge status: Home / Self Care | Payer: 59 | Source: Ambulatory Visit | Attending: Family Medicine | Admitting: Family Medicine

## 2022-03-17 DIAGNOSIS — M25571 Pain in right ankle and joints of right foot: Secondary | ICD-10-CM | POA: Insufficient documentation

## 2022-03-17 DIAGNOSIS — G8929 Other chronic pain: Secondary | ICD-10-CM | POA: Insufficient documentation

## 2022-03-19 ENCOUNTER — Encounter: Payer: Self-pay | Admitting: Family Medicine

## 2022-03-29 ENCOUNTER — Ambulatory Visit
Admission: EM | Admit: 2022-03-29 | Discharge: 2022-03-29 | Disposition: A | Discharge status: Home / Self Care | Payer: 59 | Attending: Emergency Medicine | Admitting: Emergency Medicine

## 2022-03-29 ENCOUNTER — Other Ambulatory Visit: Payer: Self-pay

## 2022-03-29 DIAGNOSIS — J029 Acute pharyngitis, unspecified: Secondary | ICD-10-CM | POA: Diagnosis present

## 2022-03-29 DIAGNOSIS — Z20818 Contact with and (suspected) exposure to other bacterial communicable diseases: Secondary | ICD-10-CM | POA: Insufficient documentation

## 2022-03-29 LAB — GROUP A STREP BY PCR: Group A Strep by PCR: NOT DETECTED

## 2022-03-29 MED ORDER — IBUPROFEN 600 MG PO TABS
600.0000 mg | ORAL_TABLET | Freq: Four times a day (QID) | ORAL | 0 refills | Status: DC | PRN
Start: 2022-03-29 — End: 2022-07-21

## 2022-03-29 MED ORDER — PENICILLIN V POTASSIUM 500 MG PO TABS: 500.0000 mg | ORAL_TABLET | Freq: Two times a day (BID) | ORAL | 0 refills | Status: AC

## 2022-03-29 NOTE — Discharge Instructions (Addendum)
Your strep PCR is negative.  However, given your close exposure to strep, I am sending you home on penicillin. 1 gram of Tylenol and 600 mg ibuprofen together 3-4 times a day as needed for pain.  Make sure you drink plenty of extra fluids.  Some people find salt water gargles and  Traditional Medicinal's "Throat Coat" tea helpful. Take 5 mL of liquid Benadryl and 5 mL of Maalox. Mix it together, and then hold it in your mouth for as long as you can and then swallow. You may do this 4 times a day.    Go to www.goodrx.com  or www.costplusdrugs.com to look up your medications. This will give you a list of where you can find your prescriptions at the most affordable prices. Or ask the pharmacist what the cash price is, or if they have any other discount programs available to help make your medication more affordable. This can be less expensive than what you would pay with insurance.

## 2022-03-29 NOTE — ED Provider Notes (Signed)
HPI  SUBJECTIVE:  Patient reports sore throat starting last night. sx worse with swallowing.  Sx better with ibuprofen 400 mg.  + Fever tmax 101   No neck stiffness  No Cough No nasal congestion, rhinorrhea.  No change in her baseline postnasal drip No Myalgias + Headache No Rash  No loss of taste or smell No shortness of breath or difficulty breathing No nausea, vomiting No diarrhea No abdominal pain  +Recent Strep exposure-daughter diagnosed with strep yesterday.  No known COVID exposure No reflux sxs No Allergy sxs-allergies are controlled with Zyrtec, Singulair and Flonase  No Breathing difficulty, voice changes, sensation of throat swelling shut No Drooling No Trismus + abx in past month-was on Augmentin for dog bite 2 weeks ago. She got 2 doses of the COVID-vaccine + antipyretic in past 4-6 hrs-ibuprofen Past medical history of diabetes, asthma, PCOS, uterine fibroids, allergies LMP: 6/27   Past Medical History:  Diagnosis Date   Allergy    Asthma    GERD (gastroesophageal reflux disease)    Migraine    PCOS (polycystic ovarian syndrome)    PCOS (polycystic ovarian syndrome)    Sinus complaint    Type 2 diabetes mellitus without complication, without long-term current use of insulin ([place]) 07/26/2019    Past Surgical History:  Procedure Laterality Date   CHOLECYSTECTOMY  06-09-13   OVARIAN CYST REMOVAL Bilateral 2002   UPPER GI ENDOSCOPY  11-29-12   Dr Candace Cruise    Family History  Problem Relation Age of Onset   Diabetes Mother    Asthma Mother    Diabetes Father    Heart attack Father    Heart disease Father    Asthma Sister    Bipolar disorder Sister    Asthma Brother    Bipolar disorder Brother    Epilepsy Brother     Social History   Tobacco Use   Smoking status: Former    Years: 12.00    Types: Cigarettes    Quit date: 08/24/2012    Years since quitting: 9.6   Smokeless tobacco: Never  Vaping Use   Vaping Use: Never used  Substance Use  Topics   Alcohol use: No    Alcohol/week: 0.0 standard drinks of alcohol   Drug use: No    No current facility-administered medications for this encounter.  Current Outpatient Medications:    albuterol (VENTOLIN HFA) 108 (90 Base) MCG/ACT inhaler, INHALE 2 PUFFS INTO THE LUNGS EVERY 6 HOURS AS NEEDED FOR WHEEZING, Disp: 18 g, Rfl: 6   aspirin 81 MG tablet, Take 81 mg by mouth daily., Disp: , Rfl:    cetirizine (ZYRTEC) 10 MG tablet, Take 10 mg by mouth daily., Disp: , Rfl:    citalopram (CELEXA) 20 MG tablet, Take 1 tablet (20 mg total) by mouth daily., Disp: 90 tablet, Rfl: 1   EPINEPHrine 0.3 mg/0.3 mL IJ SOAJ injection, INJ 0.3 ML IM ONCE FOR 1 DOSE, Disp: 2 each, Rfl: 12   fluticasone (FLONASE) 50 MCG/ACT nasal spray, INSTILL 2 SPRAYS IN EACH NOSTRIL EVERY DAY, Disp: 16 g, Rfl: 12   ibuprofen (ADVIL) 600 MG tablet, Take 1 tablet (600 mg total) by mouth every 6 (six) hours as needed., Disp: 30 tablet, Rfl: 0   montelukast (SINGULAIR) 10 MG tablet, Take 1 tablet (10 mg total) by mouth at bedtime., Disp: 90 tablet, Rfl: 1   omeprazole (PRILOSEC) 40 MG capsule, Take 1 capsule (40 mg total) by mouth daily., Disp: 90 capsule, Rfl: 1  penicillin v potassium (VEETID) 500 MG tablet, Take 1 tablet (500 mg total) by mouth 2 (two) times daily for 10 days., Disp: 20 tablet, Rfl: 0   Probiotic Product (PROBIOTIC DAILY PO), Take 1 capsule by mouth daily., Disp: , Rfl:    promethazine (PHENERGAN) 25 MG tablet, Take 1 tablet (25 mg total) by mouth every 6 (six) hours as needed for nausea or vomiting., Disp: 20 tablet, Rfl: 0   SUMAtriptan (IMITREX) 100 MG tablet, Take 1 tablet (100 mg total) by mouth every 2 (two) hours as needed for migraine. One tab at onset and may repeat x1 in 1 hour. Max 200 mg/24 hours, Disp: 10 tablet, Rfl: 12   tiotropium (SPIRIVA HANDIHALER) 18 MCG inhalation capsule, Place 1 capsule (18 mcg total) into inhaler and inhale daily., Disp: 30 capsule, Rfl: 12   triamcinolone cream  (KENALOG) 0.1 %, Apply 1 application  topically 2 (two) times daily., Disp: 30 g, Rfl: 0   ZOVIA 1/35E, 28, 1-35 MG-MCG tablet, Take by mouth daily., Disp: , Rfl:   Allergies  Allergen Reactions   Hydrocodone Hives and Itching   Tussionex Pennkinetic Er [Hydrocod Poli-Chlorphe Poli Er] Hives and Itching   Bee Venom Hives     ROS  As noted in HPI.   Physical Exam  BP 136/83 (BP Location: Right Arm)   Pulse (!) 101   Temp 98.4 F (36.9 C) (Oral)   Resp 18   Ht '5\' 7"'$  (1.702 m)   Wt 94.3 kg   LMP 03/24/2022 (Approximate)   SpO2 96%   BMI 32.58 kg/m   Constitutional: Well developed, well nourished, no acute distress Eyes:  EOMI, conjunctiva normal bilaterally HENT: Normocephalic, atraumatic,mucus membranes moist.  No nasal congestion.  Erythematous, swollen tonsils without exudates.  Uvula midline.  Positive cobblestoning, postnasal drip.  Respiratory: Normal inspiratory effort Cardiovascular: Mild regular tachycardia, no murmurs, rubs, gallops GI: nondistended, nontender. No appreciable splenomegaly skin: No rash, skin intact Lymph: Positive anterior cervical LN.  No posterior cervical lymphadenopathy Musculoskeletal: no deformities Neurologic: Alert & oriented x 3, no focal neuro deficits Psychiatric: Speech and behavior appropriate.   ED Course   Medications - No data to display  Orders Placed This Encounter  Procedures   Group A Strep by PCR    Standing Status:   Standing    Number of Occurrences:   1    Results for orders placed or performed during the hospital encounter of 03/29/22 (from the past 24 hour(s))  Group A Strep by PCR     Status: None   Collection Time: 03/29/22 11:55 AM   Specimen: Throat; Sterile Swab  Result Value Ref Range   Group A Strep by PCR NOT DETECTED NOT DETECTED   No results found.  ED Clinical Impression  1. Acute pharyngitis, unspecified etiology   2. Exposure to Streptococcal pharyngitis      ED Assessment/Plan  Strep  PCR negative, however, given her close exposure to strep, sending home with penicillin for 10 days. Home with ibuprofen, Tylenol, Benadryl/Maalox mixture. Patient to followup with PCP when necessary.  ER return precautions given.  Discussed labs,  MDM, plan and followup with patient. Discussed sn/sx that should prompt return to the ED. patient agrees with plan.   Meds ordered this encounter  Medications   ibuprofen (ADVIL) 600 MG tablet    Sig: Take 1 tablet (600 mg total) by mouth every 6 (six) hours as needed.    Dispense:  30 tablet    Refill:  0   penicillin v potassium (VEETID) 500 MG tablet    Sig: Take 1 tablet (500 mg total) by mouth 2 (two) times daily for 10 days.    Dispense:  20 tablet    Refill:  0     *This clinic note was created using [employer]. Therefore, there may be occasional mistakes despite careful proofreading.     Melynda Ripple, MD 03/29/22 1242

## 2022-03-29 NOTE — ED Triage Notes (Signed)
Pt c/o sore throa, fever 101 this morning, took ibuprofen, headaches,X1 day, pts daughter has strep

## 2022-05-04 ENCOUNTER — Ambulatory Visit: Payer: Self-pay | Admitting: *Deleted

## 2022-05-04 NOTE — Telephone Encounter (Signed)
Reason for Disposition  [1] Looks infected (spreading redness, pus) AND [2] large red area (> 2 in. or 5 cm)  Answer Assessment - Initial Assessment Questions 1. APPEARANCE of RASH: "Describe the rash."      A week or so ago this rash started. I tried Benadryl and Cortisone creams.  I also took Cortisone pills.   Itching into my hairline and back of my earlobes are itching.  It started at the back of my neck.    It's little dots and red.   2. LOCATION: "Where is the rash located?"      See above 3. NUMBER: "How many spots are there?"      Can't count 4. SIZE: "How big are the spots?" (Inches, centimeters or compare to size of a coin)      Little dots 5. ONSET: "When did the rash start?"      A week ago 6. ITCHING: "Does the rash itch?" If Yes, ask: "How bad is the itch?"  (Scale 0-10; or none, mild, moderate, severe)     Itching badly 7. PAIN: "Does the rash hurt?" If Yes, ask: "How bad is the pain?"  (Scale 0-10; or none, mild, moderate, severe)    - NONE (0): no pain    - MILD (1-3): doesn't interfere with normal activities     - MODERATE (4-7): interferes with normal activities or awakens from sleep     - SEVERE (8-10): excruciating pain, unable to do any normal activities     If scratch it burns. 8. OTHER SYMPTOMS: "Do you have any other symptoms?" (e.g., fever)     No 9. PREGNANCY: "Is there any chance you are pregnant?" "When was your last menstrual period?"     Not asked  Protocols used: Rash or Redness - Localized-A-AH

## 2022-05-04 NOTE — Telephone Encounter (Signed)
  Chief Complaint: itchy rash back of neck and into hairline and behind both ears. Symptoms: itching really bad and spreading  Frequency: A week or so ago Pertinent Negatives: Patient denies shortness of breath, chest tightness or wheezing or hives. Disposition: '[]'$ ED /'[]'$ Urgent Care (no appt availability in office) / '[x]'$ Appointment(In office/virtual)/ '[]'$  [place] [facility]/ '[]'$ Home Care/ '[]'$ Refused Recommended Disposition /'[]'$  Mobile Bus/ '[]'$  Follow-up with PCP Additional Notes: Appt made for 05/05/2022 at pt request due to her schedule at 11:20 with Dr. Wynetta Emery.

## 2022-05-05 ENCOUNTER — Encounter: Payer: Self-pay | Admitting: Family Medicine

## 2022-05-05 ENCOUNTER — Ambulatory Visit: Payer: 59 | Admitting: Family Medicine

## 2022-05-05 NOTE — Telephone Encounter (Signed)
[facility] requesting patient return my call directly at [phone].

## 2022-07-15 ENCOUNTER — Other Ambulatory Visit: Payer: Self-pay | Admitting: Family Medicine

## 2022-07-15 NOTE — Telephone Encounter (Signed)
Requested Prescriptions  Pending Prescriptions Disp Refills  . montelukast (SINGULAIR) 10 MG tablet [Pharmacy Med Name: MONTELUKAST '10MG'$  TABLETS] 90 tablet 1    Sig: TAKE 1 TABLET(10 MG) BY MOUTH AT BEDTIME     Pulmonology:  Leukotriene Inhibitors Passed - 07/15/2022  3:11 AM      Passed - Valid encounter within last 12 months    Recent Outpatient Visits          4 months ago Chronic pain of right ankle   [place], Megan P, DO   5 months ago Chronic pain of right ankle   [place], Avanti, MD   5 months ago Routine general medical examination at a health care facility   [facility] [place] P, DO   8 months ago Type 2 diabetes mellitus without complication, without long-term current use of insulin ([place])   [place], Megan P, DO   8 months ago Viral upper respiratory tract infection   [facility] Jon Billings, NP      Future Appointments            In 6 days Wynetta Emery, Barb Merino, DO [employer]           . omeprazole (PRILOSEC) 40 MG capsule [Pharmacy Med Name: OMEPRAZOLE '40MG'$  CAPSULES] 90 capsule 1    Sig: TAKE 1 CAPSULE(40 MG) BY MOUTH DAILY     Gastroenterology: Proton Pump Inhibitors Passed - 07/15/2022  3:11 AM      Passed - Valid encounter within last 12 months    Recent Outpatient Visits          4 months ago Chronic pain of right ankle   [place], Megan P, DO   5 months ago Chronic pain of right ankle   [place], Avanti, MD   5 months ago Routine general medical examination at a health care facility   [facility], [place] P, DO   8 months ago Type 2 diabetes mellitus without complication, without long-term current use of insulin ([place])   [place], Megan P, DO   8 months ago Viral upper respiratory tract infection   [facility] Jon Billings, NP       Future Appointments            In 6 days Wynetta Emery, Barb Merino, DO [employer], [facility]

## 2022-07-21 ENCOUNTER — Encounter: Payer: Self-pay | Admitting: Family Medicine

## 2022-07-21 ENCOUNTER — Ambulatory Visit: Payer: 59 | Admitting: Family Medicine

## 2022-07-21 VITALS — BP 152/91 | HR 87 | Temp 98.6°F | Wt 211.0 lb

## 2022-07-21 DIAGNOSIS — R509 Fever, unspecified: Secondary | ICD-10-CM | POA: Diagnosis not present

## 2022-07-21 DIAGNOSIS — G43709 Chronic migraine without aura, not intractable, without status migrainosus: Secondary | ICD-10-CM | POA: Diagnosis not present

## 2022-07-21 DIAGNOSIS — F419 Anxiety disorder, unspecified: Secondary | ICD-10-CM | POA: Insufficient documentation

## 2022-07-21 DIAGNOSIS — J4540 Moderate persistent asthma, uncomplicated: Secondary | ICD-10-CM | POA: Diagnosis not present

## 2022-07-21 DIAGNOSIS — E119 Type 2 diabetes mellitus without complications: Secondary | ICD-10-CM

## 2022-07-21 LAB — URINALYSIS, ROUTINE W REFLEX MICROSCOPIC
Bilirubin, UA: NEGATIVE
Glucose, UA: NEGATIVE
Ketones, UA: NEGATIVE
Leukocytes,UA: NEGATIVE
Nitrite, UA: NEGATIVE
Protein,UA: NEGATIVE
RBC, UA: NEGATIVE
Specific Gravity, UA: 1.01 (ref 1.005–1.030)
Urobilinogen, Ur: 0.2 mg/dL (ref 0.2–1.0)
pH, UA: 6.5 (ref 5.0–7.5)

## 2022-07-21 LAB — MICROALBUMIN, URINE WAIVED
Creatinine, Urine Waived: 50 mg/dL (ref 10–300)
Microalb, Ur Waived: 10 mg/L (ref 0–19)
Microalb/Creat Ratio: <30 mg/g (ref <30)

## 2022-07-21 LAB — BAYER DCA HB A1C WAIVED: HB A1C (BAYER DCA - WAIVED): 7.2 % — ABNORMAL HIGH (ref 4.8–5.6)

## 2022-07-21 MED ORDER — CITALOPRAM HYDROBROMIDE 20 MG PO TABS
20.0000 mg | ORAL_TABLET | Freq: Every day | ORAL | 1 refills | Status: DC
Start: 2022-07-21 — End: 2023-01-20

## 2022-07-21 MED ORDER — ALBUTEROL SULFATE HFA 108 (90 BASE) MCG/ACT IN AERS: INHALATION_SPRAY | RESPIRATORY_TRACT | 6 refills | Status: DC

## 2022-07-21 MED ORDER — AMOXICILLIN-POT CLAVULANATE 875-125 MG PO TABS: 1.0000 | ORAL_TABLET | Freq: Two times a day (BID) | ORAL | 0 refills | Status: DC

## 2022-07-21 NOTE — Assessment & Plan Note (Signed)
Up slightly with A1c of 7.2 up from 6.4. Will really work on her diet and recheck 3 months. Call with any concerns.

## 2022-07-21 NOTE — Assessment & Plan Note (Signed)
Under good control on current regimen. Continue current regimen. Continue to monitor. Call with any concerns. Refills given.   

## 2022-07-21 NOTE — Progress Notes (Signed)
BP (!) 152/91   Pulse 87   Temp 98.6 F (37 C)   Wt 211 lb (95.7 kg)   SpO2 100%   BMI 33.05 kg/m    Subjective:    Patient ID: Ashley Kidd, female    DOB: [birth year], 41 y.o.   MRN: [id number]  HPI: Ashley Kidd is a 41 y.o. female  Chief Complaint  Patient presents with   Diabetes   Migraine   Asthma   Sore Throat    Patient states her kids have been diagnosed with strep and she now has a sore throat. Had fever off and on for about a week.    URI    Patient has had cough, congestion for about a month.    UPPER RESPIRATORY TRACT INFECTION Duration: about a week Worst symptom: sore throat Fever: yes Cough: yes Shortness of breath: no Wheezing: no Chest pain: no Chest tightness: no Chest congestion: no Nasal congestion: yes Runny nose: yes Post nasal drip: yes Sneezing: no Sore throat: yes Swollen glands: no Sinus pressure: yes Headache: yes Face pain: yes Toothache: yes Ear pain: yes  Ear pressure: no  Eyes red/itching:no Eye drainage/crusting: no  Vomiting: no Rash: no Fatigue: yes Sick contacts: yes Strep contacts: yes  Context: worse Recurrent sinusitis: no Relief with OTC cold/cough medications: no  Treatments attempted: mucinex, sinus irrigation  DIABETES Hypoglycemic episodes:no Polydipsia/polyuria: no Visual disturbance: no Chest pain: no Paresthesias: no Glucose Monitoring: yes  Accucheck frequency: occasionally Taking Insulin?: no Blood Pressure Monitoring: not checking Retinal Examination: Up to Date Foot Exam: Up to Date Diabetic Education: Completed Pneumovax: Up to Date Influenza: Up to Date Aspirin: no   ANXIETY Mood status: stable Satisfied with current treatment?: yes Symptom severity: mild  Duration of current treatment : months Side effects: no Medication compliance: excellent compliance Psychotherapy/counseling: no  Previous psychiatric medications: celexa Depressed mood: no Anxious mood:  yes Anhedonia: no Significant weight loss or gain: no Insomnia: no  Fatigue: yes Feelings of worthlessness or guilt: no Impaired concentration/indecisiveness: no Suicidal ideations: no Hopelessness: no Crying spells: no    07/21/2022    8:50 AM 02/10/2022    1:56 PM 01/19/2022    1:29 PM 11/04/2021   11:08 AM 10/23/2021    3:28 PM  Depression screen PHQ 2/9  Decreased Interest 1 0 0 1 1  Down, Depressed, Hopeless 1 0 0 1 1  PHQ - 2 Score 2 0 0 2 2  Altered sleeping '2 1 1 1 2  '$ Tired, decreased energy '2 3 1 1 2  '$ Change in appetite '2 2 2 1 1  '$ Feeling bad or failure about yourself  0 0 0 1 1  Trouble concentrating '1 2 1 1 1  '$ Moving slowly or fidgety/restless 1 0 0 1 0  Suicidal thoughts 0 0 0 1 0  PHQ-9 Score '10 8 5 9 9  '$ Difficult doing work/chores    Somewhat difficult Somewhat difficult   Migraines have been good- few and far between  Relevant past medical, surgical, family and social history reviewed and updated as indicated. Interim medical history since our last visit reviewed. Allergies and medications reviewed and updated.  Review of Systems  Constitutional:  Positive for chills, diaphoresis, fatigue and fever. Negative for activity change, appetite change and unexpected weight change.  HENT:  Positive for congestion, postnasal drip, rhinorrhea, sinus pressure, sinus pain and sore throat. Negative for dental problem, drooling, ear discharge, ear pain, facial swelling, hearing loss, mouth sores,  nosebleeds, sneezing, tinnitus, trouble swallowing and voice change.   Eyes: Negative.   Respiratory: Negative.    Cardiovascular: Negative.   Gastrointestinal: Negative.   Musculoskeletal: Negative.   Psychiatric/Behavioral: Negative.      Per HPI unless specifically indicated above     Objective:    BP (!) 152/91   Pulse 87   Temp 98.6 F (37 C)   Wt 211 lb (95.7 kg)   SpO2 100%   BMI 33.05 kg/m   Wt Readings from Last 3 Encounters:  07/21/22 211 lb (95.7 kg)   03/29/22 208 lb (94.3 kg)  03/09/22 217 lb 6.4 oz (98.6 kg)    Physical Exam Vitals and nursing note reviewed.  Constitutional:      General: She is not in acute distress.    Appearance: Normal appearance. She is not ill-appearing, toxic-appearing or diaphoretic.  HENT:     Head: Normocephalic and atraumatic.     Right Ear: Tympanic membrane, ear canal and external ear normal.     Left Ear: Tympanic membrane and external ear normal.     Nose: Nose normal.     Mouth/Throat:     Mouth: Mucous membranes are moist.     Pharynx: Pharyngeal swelling and posterior oropharyngeal erythema present.  Eyes:     General: No scleral icterus.       Right eye: No discharge.        Left eye: No discharge.     Extraocular Movements: Extraocular movements intact.     Conjunctiva/sclera: Conjunctivae normal.     Pupils: Pupils are equal, round, and reactive to light.  Cardiovascular:     Rate and Rhythm: Normal rate and regular rhythm.     Pulses: Normal pulses.     Heart sounds: Normal heart sounds. No murmur heard.    No friction rub. No gallop.  Pulmonary:     Effort: Pulmonary effort is normal. No respiratory distress.     Breath sounds: Normal breath sounds. No stridor. No wheezing, rhonchi or rales.  Chest:     Chest wall: No tenderness.  Musculoskeletal:        General: Normal range of motion.     Cervical back: Normal range of motion and neck supple.  Skin:    General: Skin is warm and dry.     Capillary Refill: Capillary refill takes less than 2 seconds.     Coloration: Skin is not jaundiced or pale.     Findings: No bruising, erythema, lesion or rash.  Neurological:     General: No focal deficit present.     Mental Status: She is alert and oriented to person, place, and time. Mental status is at baseline.  Psychiatric:        Mood and Affect: Mood normal.        Behavior: Behavior normal.        Thought Content: Thought content normal.        Judgment: Judgment normal.      Results for orders placed or performed during the hospital encounter of 03/29/22  Group A Strep by PCR   Specimen: Throat; Sterile Swab  Result Value Ref Range   Group A Strep by PCR NOT DETECTED NOT DETECTED      Assessment & Plan:   Problem List Items Addressed This Visit       Cardiovascular and Mediastinum   Migraine    Under good control on current regimen. Continue current regimen. Continue to monitor. Call with any  concerns. Refills given.        Relevant Medications   citalopram (CELEXA) 20 MG tablet     Respiratory   Moderate persistent asthma without complication    Under good control on current regimen. Continue current regimen. Continue to monitor. Call with any concerns. Refills given.        Relevant Medications   albuterol (VENTOLIN HFA) 108 (90 Base) MCG/ACT inhaler     Endocrine   Type 2 diabetes mellitus without complication, without long-term current use of insulin (HCC)    Up slightly with A1c of 7.2 up from 6.4. Will really work on her diet and recheck 3 months. Call with any concerns.       Relevant Orders   CBC with Differential/Platelet   Comprehensive metabolic panel   Lipid Panel w/o Chol/HDL Ratio   Urinalysis, Routine w reflex microscopic   TSH   Bayer DCA Hb A1c Waived   Microalbumin, Urine Waived     Other   Anxiety    Under good control on current regimen. Continue current regimen. Continue to monitor. Call with any concerns. Refills given.        Relevant Medications   citalopram (CELEXA) 20 MG tablet   Other Visit Diagnoses     Fever, unspecified fever cause    -  Primary   Will treat empirically for strep given that her kids have it- await culture and COVID. Call with any concerns.    Relevant Orders   Novel Coronavirus, NAA ([facility])   Rapid Strep screen([facility])   Influenza A & B ([facility])        Follow up plan: Return in about 3 months (around 10/21/2022) for Records release Nelly Rout for Pap  please.

## 2022-07-22 LAB — COMPREHENSIVE METABOLIC PANEL
ALT: 19 IU/L (ref 0–32)
AST: 15 IU/L (ref 0–40)
Albumin/Globulin Ratio: 2 (ref 1.2–2.2)
Albumin: 4.6 g/dL (ref 3.9–4.9)
Alkaline Phosphatase: 122 IU/L — ABNORMAL HIGH (ref 44–121)
BUN/Creatinine Ratio: 10 (ref 9–23)
BUN: 7 mg/dL (ref 6–24)
Bilirubin Total: 0.4 mg/dL (ref 0.0–1.2)
CO2: 20 mmol/L (ref 20–29)
Calcium: 9.5 mg/dL (ref 8.7–10.2)
Chloride: 100 mmol/L (ref 96–106)
Creatinine, Ser: 0.7 mg/dL (ref 0.57–1.00)
Globulin, Total: 2.3 g/dL (ref 1.5–4.5)
Glucose: 143 mg/dL — ABNORMAL HIGH (ref 70–99)
Potassium: 4.2 mmol/L (ref 3.5–5.2)
Sodium: 136 mmol/L (ref 134–144)
Total Protein: 6.9 g/dL (ref 6.0–8.5)
eGFR: 111 mL/min/{1.73_m2} (ref >59)

## 2022-07-22 LAB — TSH: TSH: 1.5 u[IU]/mL (ref 0.450–4.500)

## 2022-07-22 LAB — CBC WITH DIFFERENTIAL/PLATELET
Basophils Absolute: 0.1 10*3/uL (ref 0.0–0.2)
Basos: 1 %
EOS (ABSOLUTE): 0.2 10*3/uL (ref 0.0–0.4)
Eos: 2 %
Hematocrit: 37.3 % (ref 34.0–46.6)
Hemoglobin: 11.5 g/dL (ref 11.1–15.9)
Immature Grans (Abs): 0 10*3/uL (ref 0.0–0.1)
Immature Granulocytes: 0 %
Lymphocytes Absolute: 2.6 10*3/uL (ref 0.7–3.1)
Lymphs: 30 %
MCH: 23.9 pg — ABNORMAL LOW (ref 26.6–33.0)
MCHC: 30.8 g/dL — ABNORMAL LOW (ref 31.5–35.7)
MCV: 78 fL — ABNORMAL LOW (ref 79–97)
Monocytes Absolute: 0.5 10*3/uL (ref 0.1–0.9)
Monocytes: 5 %
Neutrophils Absolute: 5.3 10*3/uL (ref 1.4–7.0)
Neutrophils: 62 %
Platelets: 520 10*3/uL — ABNORMAL HIGH (ref 150–450)
RBC: 4.81 x10E6/uL (ref 3.77–5.28)
RDW: 14.9 % (ref 11.7–15.4)
WBC: 8.7 10*3/uL (ref 3.4–10.8)

## 2022-07-22 LAB — LIPID PANEL W/O CHOL/HDL RATIO
Cholesterol, Total: 207 mg/dL — ABNORMAL HIGH (ref 100–199)
HDL: 34 mg/dL — ABNORMAL LOW (ref >39)
LDL Chol Calc (NIH): 137 mg/dL — ABNORMAL HIGH (ref 0–99)
Triglycerides: 199 mg/dL — ABNORMAL HIGH (ref 0–149)
VLDL Cholesterol Cal: 36 mg/dL (ref 5–40)

## 2022-07-22 LAB — NOVEL CORONAVIRUS, NAA: SARS-CoV-2, NAA: NOT DETECTED

## 2022-07-22 NOTE — Progress Notes (Signed)
Contacted via MyChart   Good evening Ashley Kidd, your Covid has returned negative:)

## 2022-07-24 LAB — RAPID STREP SCREEN (MED CTR MEBANE ONLY): Strep Gp A Ag, IA W/Reflex: NEGATIVE

## 2022-07-24 LAB — VERITOR FLU A/B WAIVED
Influenza A: NEGATIVE
Influenza B: NEGATIVE

## 2022-07-24 LAB — CULTURE, GROUP A STREP: Strep A Culture: NEGATIVE

## 2022-08-26 ENCOUNTER — Telehealth: Payer: 59 | Admitting: Emergency Medicine

## 2022-08-26 DIAGNOSIS — B9689 Other specified bacterial agents as the cause of diseases classified elsewhere: Secondary | ICD-10-CM | POA: Diagnosis not present

## 2022-08-26 DIAGNOSIS — J019 Acute sinusitis, unspecified: Secondary | ICD-10-CM

## 2022-08-26 MED ORDER — DOXYCYCLINE HYCLATE 100 MG PO TABS: 100.0000 mg | ORAL_TABLET | Freq: Two times a day (BID) | ORAL | 0 refills | Status: AC

## 2022-08-26 NOTE — Progress Notes (Signed)
E-Visit for Sinus Problems  We are sorry that you are not feeling well.  Here is how we plan to help!  Based on what you have shared with me it looks like you have sinusitis.  Sinusitis is inflammation and infection in the sinus cavities of the head.  Based on your presentation I believe you most likely have Acute Bacterial Sinusitis.  This is an infection caused by bacteria and is treated with antibiotics. I have prescribed Doxycycline '100mg'$  by mouth twice a day for 10 days. Since you had amoxicillin less than a month ago, doxycycline is a better choice for your current illness.   You may use an oral decongestant such as Mucinex D or if you have glaucoma or high blood pressure use plain Mucinex.   Saline nasal spray help and can safely be used as often as needed for congestion.  Try using saline irrigation, such as with a neti pot, several times a day while you are sick. Many neti pots come with salt packets premeasured to use to make saline. If you use your own salt, make sure it is kosher salt or sea salt (don't use table salt as it has iodine in it and you don't need that in your nose). Use distilled water to make saline. If you mix your own saline using your own salt, the recipe is 1/4 teaspoon salt in 1 cup warm water. Using saline irrigation can help prevent and treat sinus infections.   If you develop worsening sinus pain, fever or notice severe headache and vision changes, or if symptoms are not better after completion of antibiotic, please schedule an appointment with a health care provider.    Sinus infections are not as easily transmitted as other respiratory infection, however we still recommend that you avoid close contact with loved ones, especially the very young and elderly.  Remember to wash your hands thoroughly throughout the day as this is the number one way to prevent the spread of infection!  Home Care: Only take medications as instructed by your medical team. Complete the  entire course of an antibiotic. Do not take these medications with alcohol. A steam or ultrasonic humidifier can help congestion.  You can place a towel over your head and breathe in the steam from hot water coming from a faucet. Avoid close contacts especially the very young and the elderly. Cover your mouth when you cough or sneeze. Always remember to wash your hands.  Get Help Right Away If: You develop worsening fever or sinus pain. You develop a severe head ache or visual changes. Your symptoms persist after you have completed your treatment plan.  Make sure you Understand these instructions. Will watch your condition. Will get help right away if you are not doing well or get worse.  Thank you for choosing an e-visit.  Your e-visit answers were reviewed by a board certified advanced clinical practitioner to complete your personal care plan. Depending upon the condition, your plan could have included both over the counter or prescription medications.  Please review your pharmacy choice. Make sure the pharmacy is open so you can pick up prescription now. If there is a problem, you may contact your provider through [employer] and have the prescription routed to another pharmacy.  Your safety is important to [place]. If you have drug allergies check your prescription carefully.   For the next 24 hours you can use MyChart to ask questions about today's visit, request a non-urgent call back, or ask  for a work or school excuse. You will get an email in the next two days asking about your experience. I hope that your e-visit has been valuable and will speed your recovery.  I have spent 5 minutes in review of e-visit questionnaire, review and updating patient chart, medical decision making and response to patient.   Willeen Cass, PhD, FNP-BC

## 2022-09-01 ENCOUNTER — Telehealth: Payer: 59 | Admitting: Nurse Practitioner

## 2022-09-01 DIAGNOSIS — L309 Dermatitis, unspecified: Secondary | ICD-10-CM

## 2022-09-01 MED ORDER — TRIAMCINOLONE ACETONIDE 0.1 % EX CREA: 1.0000 | TOPICAL_CREAM | Freq: Two times a day (BID) | CUTANEOUS | 0 refills | Status: DC

## 2022-09-01 NOTE — Progress Notes (Signed)
E Visit for Rash  We are sorry that you are not feeling well. Here is how we plan to help!  Based on what you shared with me it looks like you have contact dermatitis.  Contact dermatitis is a skin rash caused by something that touches the skin and causes irritation or inflammation.  Your skin may be red, swollen, dry, cracked, and itch.  The rash should go away in a few days but can last a few weeks.  If you get a rash, it's important to figure out what caused it so the irritant can be avoided in the future.  We will prescribe a stronger steroid cream to use on the area. You should stop using the over the counter creams you have been using. You may also add a daily Claritin '10mg'$  and use benadryl before bed to help with itching   Meds ordered this encounter  Medications   triamcinolone cream (KENALOG) 0.1 %    Sig: Apply 1 Application topically 2 (two) times daily.    Dispense:  30 g    Refill:  0     HOME CARE:  Take cool showers and avoid direct sunlight. Apply cool compress or wet dressings. Take a bath in an oatmeal bath.  Sprinkle content of one Aveeno packet under running faucet with comfortably warm water.  Bathe for 15-20 minutes, 1-2 times daily.  Pat dry with a towel. Do not rub the rash. Take an antihistamine like Benadryl for widespread rashes that itch.  The adult dose of Benadryl is 25-50 mg by mouth 4 times daily. Caution:  This type of medication may cause sleepiness.  Do not drink alcohol, drive, or operate dangerous machinery while taking antihistamines.  Do not take these medications if you have prostate enlargement.  Read package instructions thoroughly on all medications that you take.  GET HELP RIGHT AWAY IF:  Symptoms don't go away after treatment. Severe itching that persists. If you rash spreads or swells. If you rash begins to smell. If it blisters and opens or develops a yellow-brown crust. You develop a fever. You have a sore throat. You become short of  breath.  MAKE SURE YOU:  Understand these instructions. Will watch your condition. Will get help right away if you are not doing well or get worse.  Thank you for choosing an e-visit.  Your e-visit answers were reviewed by a board certified advanced clinical practitioner to complete your personal care plan. Depending upon the condition, your plan could have included both over the counter or prescription medications.  Please review your pharmacy choice. Make sure the pharmacy is open so you can pick up prescription now. If there is a problem, you may contact your provider through [employer] and have the prescription routed to another pharmacy.  Your safety is important to [place]. If you have drug allergies check your prescription carefully.   For the next 24 hours you can use MyChart to ask questions about today's visit, request a non-urgent call back, or ask for a work or school excuse. You will get an email in the next two days asking about your experience. I hope that your e-visit has been valuable and will speed your recovery.   I spent approximately 5 minutes reviewing the patient's history, current symptoms and coordinating their plan of care today.

## 2022-09-10 NOTE — Progress Notes (Signed)
BP (!) 146/77   Pulse 79   Temp 98.6 F (37 C) (Oral)   Wt 210 lb 1.6 oz (95.3 kg)   SpO2 99%   BMI 32.91 kg/m    Subjective:    Patient ID: Ashley Kidd, female    DOB: [birth year], 41 y.o.   MRN: [id number]  HPI: Ashley Kidd is a 41 y.o. female  Chief Complaint  Patient presents with   Rash    Pt states she has a rash all over that started a month ago.    RASH Duration:   1 month   Location: trunk, hands, and legs  Itching: yes Burning: no Redness: yes Oozing: no Scaling: no Blisters: no Painful: no Fevers: no Change in detergents/soaps/personal care products: no Recent illness: no Recent travel:no History of same: no Context: worse Alleviating factors:  triamcinalone, hydrocortisone cream, benadryl, and lotion/moisturizer Treatments attempted:benadryl Shortness of breath: no  Throat/tongue swelling: no Myalgias/arthralgias: no  Relevant past medical, surgical, family and social history reviewed and updated as indicated. Interim medical history since our last visit reviewed. Allergies and medications reviewed and updated.  Review of Systems  Skin:  Positive for rash.    Per HPI unless specifically indicated above     Objective:    BP (!) 146/77   Pulse 79   Temp 98.6 F (37 C) (Oral)   Wt 210 lb 1.6 oz (95.3 kg)   SpO2 99%   BMI 32.91 kg/m   Wt Readings from Last 3 Encounters:  09/11/22 210 lb 1.6 oz (95.3 kg)  07/21/22 211 lb (95.7 kg)  03/29/22 208 lb (94.3 kg)    Physical Exam Vitals and nursing note reviewed.  Constitutional:      General: She is not in acute distress.    Appearance: Normal appearance. She is normal weight. She is not ill-appearing, toxic-appearing or diaphoretic.  HENT:     Head: Normocephalic.     Right Ear: External ear normal.     Left Ear: External ear normal.     Nose: Nose normal.     Mouth/Throat:     Mouth: Mucous membranes are moist.     Pharynx: Oropharynx is clear.  Eyes:     General:         Right eye: No discharge.        Left eye: No discharge.     Extraocular Movements: Extraocular movements intact.     Conjunctiva/sclera: Conjunctivae normal.     Pupils: Pupils are equal, round, and reactive to light.  Cardiovascular:     Rate and Rhythm: Normal rate and regular rhythm.     Heart sounds: No murmur heard. Pulmonary:     Effort: Pulmonary effort is normal. No respiratory distress.     Breath sounds: Normal breath sounds. No wheezing or rales.  Musculoskeletal:     Cervical back: Normal range of motion and neck supple.  Skin:    General: Skin is warm and dry.     Capillary Refill: Capillary refill takes less than 2 seconds.     Findings: Erythema and rash present. Rash is papular and urticarial.  Neurological:     General: No focal deficit present.     Mental Status: She is alert and oriented to person, place, and time. Mental status is at baseline.  Psychiatric:        Mood and Affect: Mood normal.        Behavior: Behavior normal.  Thought Content: Thought content normal.        Judgment: Judgment normal.     Results for orders placed or performed in visit on 07/27/22  HM DIABETES EYE EXAM  Result Value Ref Range   HM Diabetic Eye Exam No Retinopathy No Retinopathy      Assessment & Plan:   Problem List Items Addressed This Visit   None Visit Diagnoses     Dermatitis       Will treat with Prednisone x 12 days due to length of time symptoms have been going on. Follow up if symptoms do not improve.   Relevant Medications   triamcinolone cream (KENALOG) 0.1 %        Follow up plan: Return if symptoms worsen or fail to improve.

## 2022-09-11 ENCOUNTER — Encounter: Payer: Self-pay | Admitting: Nurse Practitioner

## 2022-09-11 ENCOUNTER — Ambulatory Visit: Payer: 59 | Admitting: Nurse Practitioner

## 2022-09-11 DIAGNOSIS — L309 Dermatitis, unspecified: Secondary | ICD-10-CM | POA: Diagnosis not present

## 2022-09-11 MED ORDER — TRIAMCINOLONE ACETONIDE 0.1 % EX CREA: 1.0000 | TOPICAL_CREAM | Freq: Two times a day (BID) | CUTANEOUS | 1 refills | Status: DC

## 2022-09-11 MED ORDER — PREDNISONE 10 MG PO TABS: 10.0000 mg | ORAL_TABLET | Freq: Every day | ORAL | 0 refills | Status: DC

## 2022-09-22 ENCOUNTER — Ambulatory Visit: Payer: Self-pay

## 2022-09-22 ENCOUNTER — Ambulatory Visit
Admission: EM | Admit: 2022-09-22 | Discharge: 2022-09-22 | Disposition: A | Discharge status: Home / Self Care | Payer: 59

## 2022-09-22 DIAGNOSIS — J069 Acute upper respiratory infection, unspecified: Secondary | ICD-10-CM

## 2022-09-22 NOTE — Discharge Instructions (Addendum)
Continue the prednisone as directed.  Continue to use your albuterol inhaler as directed.  Follow-up with your primary care provider.

## 2022-09-22 NOTE — ED Triage Notes (Signed)
Patient to Urgent Care with complaints of cough x4 days. States cough is productive with green sputum, reports that her ribs/ chest are sore from coughing and feels as though her asthma is flaring up.   States she has had intermittent low grade fevers. Reports hx of bronchitis.   Reports she has been using her prescribed inhalers/ mucinex/ hot steam/ decongestants.

## 2022-09-22 NOTE — ED Provider Notes (Signed)
Ashley Kidd    CSN: [id number] Arrival date & time: 09/22/22  8416      History   Chief Complaint Chief Complaint  Patient presents with   Cough    Possibly flu or bronchitis - Entered by patient    HPI Ashley Kidd is a 41 y.o. female.  Patient presents with 3-4 day history of low grade fever, congestion, cough, wheezing, mild shortness of breath.  She denies chest pain, vomiting, diarrhea, or other symptoms.  Treatment attempted with albuterol inhaler and OTC decongestant; she is currently on a prednisone taper for a rash. Patient reports her albuterol inhaler is in-date and has plenty of activations left.   Patient was seen by her PCP on 09/11/2022; diagnosed with dermatitis; treated with prednisone taper.  She had an e-visit 09/01/2022; diagnosed with dermatitis; treated with triamcinolone cream.  She had an e-visit on 08/26/2022; diagnosed with sinusitis and treated with doxycycline.  Her medical history includes asthma, allergies, diabetes, GERD, PCOS, migraine headaches, low back pain.    The history is provided by the patient and medical records.    Past Medical History:  Diagnosis Date   Allergy    Asthma    GERD (gastroesophageal reflux disease)    Migraine    PCOS (polycystic ovarian syndrome)    PCOS (polycystic ovarian syndrome)    Sinus complaint    Type 2 diabetes mellitus without complication, without long-term current use of insulin ([place]) 07/26/2019    Patient Active Problem List   Diagnosis Date Noted   Anxiety 07/21/2022   Chronic pain of right ankle 02/10/2022   Arthritis of knee 11/04/2021   Type 2 diabetes mellitus without complication, without long-term current use of insulin ([place]) 07/26/2019   Moderate persistent asthma without complication 60/63/0160   C. difficile diarrhea 12/24/2015   Migraine    Allergy    Low back pain 03/26/2015   Chronic cholecystitis without calculus 05/19/2013    Past Surgical History:  Procedure  Laterality Date   CHOLECYSTECTOMY  06-09-13   OVARIAN CYST REMOVAL Bilateral 2002   UPPER GI ENDOSCOPY  11-29-12   Dr Candace Cruise    OB History     Gravida  0   Para  0   Term  0   Preterm  0   AB  0   Living  0      SAB  0   IAB  0   Ectopic  0   Multiple  0   Live Births           Obstetric Comments  1st Menstrual Cycle: 13 1st Pregnancy:  NA          Home Medications    Prior to Admission medications   Medication Sig Start Date End Date Taking? Authorizing Provider  albuterol (VENTOLIN HFA) 108 (90 Base) MCG/ACT inhaler INHALE 2 PUFFS INTO THE LUNGS EVERY 6 HOURS AS NEEDED FOR WHEEZING 07/21/22   Johnson, Megan P, DO  aspirin 81 MG tablet Take 81 mg by mouth daily.    [provider]  cetirizine (ZYRTEC) 10 MG tablet Take 10 mg by mouth daily.    [provider]  citalopram (CELEXA) 20 MG tablet Take 1 tablet (20 mg total) by mouth daily. 07/21/22   Johnson, Megan P, DO  EPINEPHrine 0.3 mg/0.3 mL IJ SOAJ injection INJ 0.3 ML IM ONCE FOR 1 DOSE 01/19/22   Johnson, Megan P, DO  fluticasone (FLONASE) 50 MCG/ACT nasal spray INSTILL 2 SPRAYS  IN [facility] NOSTRIL EVERY DAY 01/19/22   Johnson, Megan P, DO  montelukast (SINGULAIR) 10 MG tablet TAKE 1 TABLET(10 MG) BY MOUTH AT BEDTIME 07/15/22   Johnson, Megan P, DO  norethindrone (AYGESTIN) 5 MG tablet Take 5 mg by mouth daily. 08/12/22   [provider]  omeprazole (PRILOSEC) 40 MG capsule TAKE 1 CAPSULE(40 MG) BY MOUTH DAILY 07/15/22   Wynetta Emery, Megan P, DO  predniSONE (DELTASONE) 10 MG tablet Take 1 tablet (10 mg total) by mouth daily with breakfast. Take 6 for 2 days, 5 for 2 days and decrease by 1 every other day until course is complete 09/11/22   Jon Billings, NP  Probiotic Product (PROBIOTIC DAILY PO) Take 1 capsule by mouth daily.    [provider]  promethazine (PHENERGAN) 25 MG tablet Take 1 tablet (25 mg total) by mouth every 6 (six) hours as needed for nausea or vomiting.  01/19/22   Wynetta Emery, Megan P, DO  SUMAtriptan (IMITREX) 100 MG tablet Take 1 tablet (100 mg total) by mouth every 2 (two) hours as needed for migraine. One tab at onset and may repeat x1 in 1 hour. Max 200 mg/24 hours 01/19/22   Johnson, Megan P, DO  tiotropium (SPIRIVA HANDIHALER) 18 MCG inhalation capsule Place 1 capsule (18 mcg total) into inhaler and inhale daily. 02/12/22   Johnson, Megan P, DO  triamcinolone cream (KENALOG) 0.1 % Apply 1 Application topically 2 (two) times daily. 09/11/22   Jon Billings, NP  ipratropium (ATROVENT) 0.06 % nasal spray Place 2 sprays into both nostrils 4 (four) times daily as needed for rhinitis. 10/15/20 01/11/21  Coral Spikes, DO    Family History Family History  Problem Relation Age of Onset   Diabetes Mother    Asthma Mother    Diabetes Father    Heart attack Father    Heart disease Father    Asthma Sister    Bipolar disorder Sister    Asthma Brother    Bipolar disorder Brother    Epilepsy Brother     Social History Social History   Tobacco Use   Smoking status: Former    Years: 12.00    Types: Cigarettes    Quit date: 08/24/2012    Years since quitting: 10.0   Smokeless tobacco: Never  Vaping Use   Vaping Use: Never used  Substance Use Topics   Alcohol use: No    Alcohol/week: 0.0 standard drinks of alcohol   Drug use: No     Allergies   Hydrocodone, Tussionex pennkinetic er [hydrocod poli-chlorphe poli er], and Bee venom   Review of Systems Review of Systems  Constitutional:  Positive for fever. Negative for chills.  HENT:  Positive for congestion. Negative for ear pain and sore throat.   Respiratory:  Positive for cough and shortness of breath.   Cardiovascular:  Negative for chest pain and palpitations.  Gastrointestinal:  Negative for diarrhea and vomiting.  Skin:  Negative for color change and rash.  All other systems reviewed and are negative.    Physical Exam Triage Vital Signs ED Triage Vitals  Enc Vitals  Group     BP 09/22/22 0947 (!) 143/88     Pulse Rate 09/22/22 0947 100     Resp 09/22/22 0947 (!) 22     Temp 09/22/22 0947 99.7 F (37.6 C)     Temp src --      SpO2 09/22/22 0947 98 %     Weight 09/22/22 0945 207 lb (93.9  kg)     Height 09/22/22 0945 '5\' 7"'$  (1.702 m)     Head Circumference --      Peak Flow --      Pain Score 09/22/22 0938 4     Pain Loc --      Pain Edu? --      Excl. in [place]? --    No data found.  Updated Vital Signs BP (!) 143/88   Pulse 100   Temp 99.7 F (37.6 C)   Resp (!) 22   Ht '5\' 7"'$  (1.702 m)   Wt 207 lb (93.9 kg)   SpO2 98%   BMI 32.42 kg/m   Visual Acuity Right Eye Distance:   Left Eye Distance:   Bilateral Distance:    Right Eye Near:   Left Eye Near:    Bilateral Near:     Physical Exam Vitals and nursing note reviewed.  Constitutional:      General: She is not in acute distress.    Appearance: She is well-developed. She is obese. She is not ill-appearing.  HENT:     Right Ear: Tympanic membrane normal.     Left Ear: Tympanic membrane normal.     Nose: Nose normal.     Mouth/Throat:     Mouth: Mucous membranes are moist.     Pharynx: Oropharynx is clear.  Cardiovascular:     Rate and Rhythm: Normal rate and regular rhythm.     Heart sounds: Normal heart sounds.  Pulmonary:     Effort: Pulmonary effort is normal. No respiratory distress.     Breath sounds: Normal breath sounds. No wheezing.  Musculoskeletal:     Cervical back: Neck supple.  Skin:    General: Skin is warm and dry.  Neurological:     Mental Status: She is alert.  Psychiatric:        Mood and Affect: Mood normal.        Behavior: Behavior normal.      UC Treatments / Results  Labs (all labs ordered are listed, but only abnormal results are displayed) Labs Reviewed - No data to display  EKG   Radiology No results found.  Procedures Procedures (including critical care time)  Medications Ordered in UC Medications - No data to  display  Initial Impression / Assessment and Plan / UC Course  I have reviewed the triage vital signs and the nursing notes.  Pertinent labs & imaging results that were available during my care of the patient were reviewed by me and considered in my medical decision making (see chart for details).    Viral URI.  No respiratory distress, lungs are clear, O2 sat 98% on room air.  Patient is currently on a prednisone taper and has been using her albuterol inhaler.  She reports her inhaler is in-date and has plenty of activations.  Instructed patient to follow up with her PCP if her symptoms are not improving.  ED precautions discussed.  Education provided on viral respiratory infection.  She agrees to plan of care.    Final Clinical Impressions(s) / UC Diagnoses   Final diagnoses:  Viral URI     Discharge Instructions      Continue the prednisone as directed.  Continue to use your albuterol inhaler as directed.  Follow-up with your primary care provider.     ED Prescriptions   None    PDMP not reviewed this encounter.   Sharion Balloon, NP 09/22/22 1022

## 2022-09-22 NOTE — Telephone Encounter (Signed)
     Chief Complaint: Cough, fever Symptoms: Above Frequency: 3 days ago Pertinent Negatives: Patient denies  Disposition: '[]'$ ED /'[]'$ Urgent Care (no appt availability in office) / '[x]'$ Appointment(In office/virtual)/ '[]'$  [place] [facility]/ '[]'$ Home Care/ '[]'$ Refused Recommended Disposition /'[]'$ [place] [facility]/ '[]'$  Follow-up with PCP Additional Notes:   Reason for Disposition  [1] Continuous (nonstop) coughing interferes with work or school AND [2] no improvement using cough treatment per Care Advice  Answer Assessment - Initial Assessment Questions 1. ONSET: "When did the cough begin?"      3 days ago 2. SEVERITY: "How bad is the cough today?"      Severe  3. SPUTUM: "Describe the color of your sputum" (none, dry cough; clear, white, yellow, green)     Green 4. HEMOPTYSIS: "Are you coughing up any blood?" If so ask: "How much?" (flecks, streaks, tablespoons, etc.)     No 5. DIFFICULTY BREATHING: "Are you having difficulty breathing?" If Yes, ask: "How bad is it?" (e.g., mild, moderate, severe)    - MILD: No SOB at rest, mild SOB with walking, speaks normally in sentences, can lie down, no retractions, pulse < 100.    - MODERATE: SOB at rest, SOB with minimal exertion and prefers to sit, cannot lie down flat, speaks in phrases, mild retractions, audible wheezing, pulse 100-120.    - SEVERE: Very SOB at rest, speaks in single words, struggling to breathe, sitting hunched forward, retractions, pulse > 120      Mild 6. FEVER: "Do you have a fever?" If Yes, ask: "What is your temperature, how was it measured, and when did it start?"     Yes 7. CARDIAC HISTORY: "Do you have any history of heart disease?" (e.g., heart attack, congestive heart failure)      No 8. LUNG HISTORY: "Do you have any history of lung disease?"  (e.g., pulmonary embolus, asthma, emphysema)     Asthma 9. PE RISK FACTORS: "Do you have a history of blood clots?" (or: recent major surgery, recent prolonged travel,  bedridden)     No 10. OTHER SYMPTOMS: "Do you have any other symptoms?" (e.g., runny nose, wheezing, chest pain)       Chest sore 11. PREGNANCY: "Is there any chance you are pregnant?" "When was your last menstrual period?"       No 12. TRAVEL: "Have you traveled out of the country in the last month?" (e.g., travel history, exposures)       No  Protocols used: Cough - Acute Productive-A-AH

## 2022-09-24 ENCOUNTER — Ambulatory Visit: Payer: 59 | Admitting: Physician Assistant

## 2022-09-29 ENCOUNTER — Encounter: Payer: Self-pay | Admitting: Family Medicine

## 2022-09-29 ENCOUNTER — Ambulatory Visit: Payer: 59 | Admitting: Family Medicine

## 2022-09-29 VITALS — BP 124/78 | HR 83 | Temp 98.4°F | Ht 67.0 in | Wt 211.1 lb

## 2022-09-29 DIAGNOSIS — L509 Urticaria, unspecified: Secondary | ICD-10-CM | POA: Diagnosis not present

## 2022-09-29 MED ORDER — PREDNISONE 10 MG PO TABS: 10.0000 mg | ORAL_TABLET | Freq: Every day | ORAL | 0 refills | Status: DC

## 2022-09-29 NOTE — Progress Notes (Signed)
BP 124/78   Pulse 83   Temp 98.4 F (36.9 C) (Oral)   Ht '5\' 7"'$  (1.702 m)   Wt 211 lb 1.6 oz (95.8 kg)   SpO2 99%   BMI 33.06 kg/m    Subjective:    Patient ID: Ashley Kidd, female    DOB: 04-08-1981, 42 y.o.   MRN: [id number]  HPI: Ashley Kidd is a 42 y.o. female  Chief Complaint  Patient presents with   Rash    Patient says she recently saw Jon Billings about two weeks ago and was told she had hives. Patient says she was prescribed Prednisone and a cream. Patient says she is still having issues and the cream isn't helping her and it won't go away.    RASH Duration:  weeks  Location: generalized  Itching: yes Burning: yes Redness: yes Oozing: no Scaling: yes Blisters: no Painful: no Fevers: no Change in detergents/soaps/personal care products: no Recent illness: yes Recent travel:no History of same: yes Context: stable Alleviating factors: prednisone Treatments attempted:prednisone, triamcinalone cream Shortness of breath: no  Throat/tongue swelling: no Myalgias/arthralgias: no  Relevant past medical, surgical, family and social history reviewed and updated as indicated. Interim medical history since our last visit reviewed. Allergies and medications reviewed and updated.  Review of Systems  Constitutional: Negative.   HENT: Negative.    Respiratory:  Positive for cough, shortness of breath and wheezing. Negative for apnea, choking, chest tightness and stridor.   Cardiovascular: Negative.   Gastrointestinal: Negative.   Psychiatric/Behavioral: Negative.      Per HPI unless specifically indicated above     Objective:    BP 124/78   Pulse 83   Temp 98.4 F (36.9 C) (Oral)   Ht '5\' 7"'$  (1.702 m)   Wt 211 lb 1.6 oz (95.8 kg)   SpO2 99%   BMI 33.06 kg/m   Wt Readings from Last 3 Encounters:  09/29/22 211 lb 1.6 oz (95.8 kg)  09/22/22 207 lb (93.9 kg)  09/11/22 210 lb 1.6 oz (95.3 kg)    Physical Exam Vitals and nursing note reviewed.   Constitutional:      General: She is not in acute distress.    Appearance: Normal appearance. She is not ill-appearing, toxic-appearing or diaphoretic.  HENT:     Head: Normocephalic and atraumatic.     Right Ear: External ear normal.     Left Ear: External ear normal.     Nose: Nose normal.     Mouth/Throat:     Mouth: Mucous membranes are moist.     Pharynx: Oropharynx is clear.  Eyes:     General: No scleral icterus.       Right eye: No discharge.        Left eye: No discharge.     Extraocular Movements: Extraocular movements intact.     Conjunctiva/sclera: Conjunctivae normal.     Pupils: Pupils are equal, round, and reactive to light.  Cardiovascular:     Rate and Rhythm: Normal rate and regular rhythm.     Pulses: Normal pulses.     Heart sounds: Normal heart sounds. No murmur heard.    No friction rub. No gallop.  Pulmonary:     Effort: Pulmonary effort is normal. No respiratory distress.     Breath sounds: Normal breath sounds. No stridor. No wheezing, rhonchi or rales.  Chest:     Chest wall: No tenderness.  Musculoskeletal:        General: Normal range  of motion.     Cervical back: Normal range of motion and neck supple.  Skin:    General: Skin is warm and dry.     Capillary Refill: Capillary refill takes less than 2 seconds.     Coloration: Skin is not jaundiced or pale.     Findings: Rash present. No bruising, erythema or lesion.  Neurological:     General: No focal deficit present.     Mental Status: She is alert and oriented to person, place, and time. Mental status is at baseline.  Psychiatric:        Mood and Affect: Mood normal.        Behavior: Behavior normal.        Thought Content: Thought content normal.        Judgment: Judgment normal.     Results for orders placed or performed in visit on 07/27/22  HM DIABETES EYE EXAM  Result Value Ref Range   HM Diabetic Eye Exam No Retinopathy No Retinopathy      Assessment & Plan:   Problem List  Items Addressed This Visit   None Visit Diagnoses     Hives    -  Primary   Returned after last treatment. Will get her into allergy and will treat with another round of steroids. Call with any concerns. Continue to monitor.   Relevant Orders   Ambulatory referral to Allergy        Follow up plan: Return in about 4 months (around 01/21/2023) for physical.

## 2022-10-16 ENCOUNTER — Other Ambulatory Visit: Payer: Self-pay | Admitting: Family Medicine

## 2022-10-16 HISTORY — PX: TOTAL ABDOMINAL HYSTERECTOMY: SHX209

## 2022-10-19 NOTE — Telephone Encounter (Signed)
Unable to refill per protocol, Rx request is too soon. Last refill 07/15/22 for 90 and 1 refill. Will refuse.  Requested Prescriptions  Pending Prescriptions Disp Refills   omeprazole (PRILOSEC) 40 MG capsule [Pharmacy Med Name: OMEPRAZOLE '40MG'$  CAPSULES] 90 capsule 1    Sig: TAKE 1 CAPSULE(40 MG) BY MOUTH DAILY     Gastroenterology: Proton Pump Inhibitors Passed - 10/16/2022 10:49 AM      Passed - Valid encounter within last 12 months    Recent Outpatient Visits           2 weeks ago [place], [place], DO   1 month ago [place], NP   3 months ago Fever, unspecified fever cause   [facility] [place], Megan P, DO   7 months ago Chronic pain of right ankle   [place] P, DO   8 months ago Chronic pain of right ankle   [place] [facility], Avanti, MD       Future Appointments             In 2 days Valerie Roys, DO [place], [facility]   In 3 months Wynetta Emery, Barb Merino, DO [place], [facility]

## 2022-10-21 ENCOUNTER — Telehealth: Payer: Self-pay

## 2022-10-21 ENCOUNTER — Encounter: Payer: Self-pay | Admitting: Family Medicine

## 2022-10-21 ENCOUNTER — Ambulatory Visit: Payer: 59 | Admitting: Family Medicine

## 2022-10-21 VITALS — BP 135/83 | HR 82 | Temp 98.3°F | Wt 207.7 lb

## 2022-10-21 DIAGNOSIS — E119 Type 2 diabetes mellitus without complications: Secondary | ICD-10-CM

## 2022-10-21 LAB — BAYER DCA HB A1C WAIVED: HB A1C (BAYER DCA - WAIVED): 7.8 % — ABNORMAL HIGH (ref 4.8–5.6)

## 2022-10-21 NOTE — Telephone Encounter (Signed)
Spoke with [facility] representative and requested most recent PAP to be faxed over for the patient. Representative says it was faxed over back on 07/22/22. Requested it be re-faxed as we never received it. Verbalized understanding.

## 2022-10-21 NOTE — Assessment & Plan Note (Signed)
A1c elevated at 7.8- has been on prednisone recently and just had surgery. Will work on diet and exercise over the next 3 months and recheck in April. If still running high, will consider adding rybelsus or ozempic.

## 2022-10-21 NOTE — Progress Notes (Signed)
BP 135/83   Pulse 82   Temp 98.3 F (36.8 C) (Oral)   Wt 207 lb 11.2 oz (94.2 kg)   LMP 03/24/2022 (Approximate)   SpO2 98%   BMI 32.53 kg/m    Subjective:    Patient ID: Ashley Kidd, female    DOB: 20-Oct-1980, 42 y.o.   MRN: [id number]  HPI: Ashley Kidd is a 42 y.o. female  Chief Complaint  Patient presents with   Diabetes   DIABETES Hypoglycemic episodes:no Polydipsia/polyuria: no Visual disturbance: no Chest pain: no Paresthesias: no Glucose Monitoring: no  Accucheck frequency: Not Checking  Fasting glucose: Taking Insulin?: no Blood Pressure Monitoring: a few times a month Retinal Examination: Up to Date Foot Exam: Up to Date Diabetic Education: Completed Pneumovax: Up to Date Influenza: Up to Date Aspirin: no  Relevant past medical, surgical, family and social history reviewed and updated as indicated. Interim medical history since our last visit reviewed. Allergies and medications reviewed and updated.  Review of Systems  Constitutional: Negative.   Respiratory: Negative.    Cardiovascular: Negative.   Gastrointestinal: Negative.   Musculoskeletal: Negative.   Neurological: Negative.   Psychiatric/Behavioral: Negative.      Per HPI unless specifically indicated above     Objective:    BP 135/83   Pulse 82   Temp 98.3 F (36.8 C) (Oral)   Wt 207 lb 11.2 oz (94.2 kg)   LMP 03/24/2022 (Approximate)   SpO2 98%   BMI 32.53 kg/m   Wt Readings from Last 3 Encounters:  10/21/22 207 lb 11.2 oz (94.2 kg)  09/29/22 211 lb 1.6 oz (95.8 kg)  09/22/22 207 lb (93.9 kg)    Physical Exam Vitals and nursing note reviewed.  Constitutional:      General: She is not in acute distress.    Appearance: Normal appearance. She is obese. She is not ill-appearing, toxic-appearing or diaphoretic.  HENT:     Head: Normocephalic and atraumatic.     Right Ear: External ear normal.     Left Ear: External ear normal.     Nose: Nose normal.      Mouth/Throat:     Mouth: Mucous membranes are moist.     Pharynx: Oropharynx is clear.  Eyes:     General: No scleral icterus.       Right eye: No discharge.        Left eye: No discharge.     Extraocular Movements: Extraocular movements intact.     Conjunctiva/sclera: Conjunctivae normal.     Pupils: Pupils are equal, round, and reactive to light.  Cardiovascular:     Rate and Rhythm: Normal rate and regular rhythm.     Pulses: Normal pulses.     Heart sounds: Normal heart sounds. No murmur heard.    No friction rub. No gallop.  Pulmonary:     Effort: Pulmonary effort is normal. No respiratory distress.     Breath sounds: Normal breath sounds. No stridor. No wheezing, rhonchi or rales.  Chest:     Chest wall: No tenderness.  Musculoskeletal:        General: Normal range of motion.     Cervical back: Normal range of motion and neck supple.  Skin:    General: Skin is warm and dry.     Capillary Refill: Capillary refill takes less than 2 seconds.     Coloration: Skin is not jaundiced or pale.     Findings: No bruising, erythema, lesion or rash.  Neurological:     General: No focal deficit present.     Mental Status: She is alert and oriented to person, place, and time. Mental status is at baseline.  Psychiatric:        Mood and Affect: Mood normal.        Behavior: Behavior normal.        Thought Content: Thought content normal.        Judgment: Judgment normal.     Results for orders placed or performed in visit on 07/27/22  HM DIABETES EYE EXAM  Result Value Ref Range   HM Diabetic Eye Exam No Retinopathy No Retinopathy      Assessment & Plan:   Problem List Items Addressed This Visit       Endocrine   Type 2 diabetes mellitus without complication, without long-term current use of insulin ([facility]CC) - Primary    A1c elevated at 7.8- has been on prednisone recently and just had surgery. Will work on diet and exercise over the next 3 months and recheck in April. If still  running high, will consider adding rybelsus or ozempic.       Relevant Orders   [facility] DCA Hb A1c Waived     Follow up plan: Return in about 3 months (around 01/20/2023).

## 2022-10-21 NOTE — Telephone Encounter (Signed)
-----  Message from Valerie Roys, [place] sent at 10/21/2022  9:07 AM EST ----- Can we please check on her pap? She previously signed a release from [employer]

## 2022-11-05 ENCOUNTER — Encounter: Payer: Self-pay | Admitting: Nurse Practitioner

## 2022-11-05 ENCOUNTER — Ambulatory Visit: Payer: 59 | Admitting: Nurse Practitioner

## 2022-11-05 VITALS — BP 150/86 | HR 92 | Temp 98.7°F | Wt 212.7 lb

## 2022-11-05 DIAGNOSIS — N3000 Acute cystitis without hematuria: Secondary | ICD-10-CM

## 2022-11-05 MED ORDER — NITROFURANTOIN MONOHYD MACRO 100 MG PO CAPS: 100.0000 mg | ORAL_CAPSULE | Freq: Two times a day (BID) | ORAL | 0 refills | Status: DC

## 2022-11-05 NOTE — Progress Notes (Signed)
BP (!) 150/86 (BP Location: Left Arm, Cuff Size: Normal)   Pulse 92   Temp 98.7 F (37.1 C) (Oral)   Wt 212 lb 11.2 oz (96.5 kg)   LMP 03/24/2022 (Approximate)   SpO2 98%   BMI 33.31 kg/m    Subjective:    Patient ID: Ashley Kidd, female    DOB: 08/04/1981, 42 y.o.   MRN: [id number]  HPI: Ashley Kidd is a 42 y.o. female  Chief Complaint  Patient presents with   Urinary Tract Infection    Pt states she has been having pain and pressure with urinating for the last 3 to 4 days    URINARY SYMPTOMS Symptoms started about 3-4 days ago. Dysuria: yes Urinary frequency: no Urgency: yes Small volume voids: yes Symptom severity: no Urinary incontinence: no Foul odor: yes Hematuria: yes Abdominal pain: no Back pain: no Suprapubic pain/pressure: yes Flank pain: yes Fever:  no Vomiting: no Relief with cranberry juice: no Relief with pyridium: no Status: worse Previous urinary tract infection: no Recurrent urinary tract infection: no Sexual activity: No sexually active/monogomous/practicing safe sex History of sexually transmitted disease: no Penile discharge: no Treatments attempted: increasing fluids   Relevant past medical, surgical, family and social history reviewed and updated as indicated. Interim medical history since our last visit reviewed. Allergies and medications reviewed and updated.  Review of Systems  Constitutional:  Negative for fever.  Gastrointestinal:  Positive for abdominal pain. Negative for vomiting.  Genitourinary:  Positive for decreased urine volume, dysuria and urgency. Negative for flank pain, frequency and hematuria.  Musculoskeletal:  Positive for back pain.    Per HPI unless specifically indicated above     Objective:    BP (!) 150/86 (BP Location: Left Arm, Cuff Size: Normal)   Pulse 92   Temp 98.7 F (37.1 C) (Oral)   Wt 212 lb 11.2 oz (96.5 kg)   LMP 03/24/2022 (Approximate)   SpO2 98%   BMI 33.31 kg/m   Wt  Readings from Last 3 Encounters:  11/05/22 212 lb 11.2 oz (96.5 kg)  10/21/22 207 lb 11.2 oz (94.2 kg)  09/29/22 211 lb 1.6 oz (95.8 kg)    Physical Exam Vitals and nursing note reviewed.  Constitutional:      General: She is not in acute distress.    Appearance: Normal appearance. She is normal weight. She is not ill-appearing, toxic-appearing or diaphoretic.  HENT:     Head: Normocephalic.     Right Ear: External ear normal.     Left Ear: External ear normal.     Nose: Nose normal.     Mouth/Throat:     Mouth: Mucous membranes are moist.     Pharynx: Oropharynx is clear.  Eyes:     General:        Right eye: No discharge.        Left eye: No discharge.     Extraocular Movements: Extraocular movements intact.     Conjunctiva/sclera: Conjunctivae normal.     Pupils: Pupils are equal, round, and reactive to light.  Cardiovascular:     Rate and Rhythm: Normal rate and regular rhythm.     Heart sounds: No murmur heard. Pulmonary:     Effort: Pulmonary effort is normal. No respiratory distress.     Breath sounds: Normal breath sounds. No wheezing or rales.  Abdominal:     General: Abdomen is flat. Bowel sounds are normal. There is no distension.     Palpations: Abdomen  is soft.     Tenderness: There is abdominal tenderness. There is right CVA tenderness and left CVA tenderness.  Musculoskeletal:     Cervical back: Normal range of motion and neck supple.  Skin:    General: Skin is warm and dry.     Capillary Refill: Capillary refill takes less than 2 seconds.  Neurological:     General: No focal deficit present.     Mental Status: She is alert and oriented to person, place, and time. Mental status is at baseline.  Psychiatric:        Mood and Affect: Mood normal.        Behavior: Behavior normal.        Thought Content: Thought content normal.        Judgment: Judgment normal.     Results for orders placed or performed in visit on 10/21/22  HM PAP SMEAR  Result Value  Ref Range   HM Pap smear See Attached Report       Assessment & Plan:   Problem List Items Addressed This Visit   None Visit Diagnoses     Acute cystitis without hematuria    -  Primary   Urinalysis and urine culture sent out.  Will treat with Macrobid BID x 5 days.  Follow up if not improved.   Relevant Orders   Urinalysis, Routine w reflex microscopic   Urine Culture        Follow up plan: Return if symptoms worsen or fail to improve.

## 2022-11-06 LAB — MICROSCOPIC EXAMINATION
Casts: NONE SEEN /lpf
RBC, Urine: NONE SEEN /hpf (ref 0–2)
WBC, UA: NONE SEEN /hpf (ref 0–5)

## 2022-11-06 LAB — URINALYSIS, ROUTINE W REFLEX MICROSCOPIC
Bilirubin, UA: NEGATIVE
Glucose, UA: NEGATIVE
Ketones, UA: NEGATIVE
Nitrite, UA: NEGATIVE
Protein,UA: NEGATIVE
RBC, UA: NEGATIVE
Specific Gravity, UA: <=1.005 — AB (ref 1.005–1.030)
Urobilinogen, Ur: 0.2 mg/dL (ref 0.2–1.0)
pH, UA: 6.5 (ref 5.0–7.5)

## 2022-11-08 LAB — URINE CULTURE

## 2022-11-09 NOTE — Progress Notes (Signed)
HI Ashley Kidd. There was no significant growth of bacteria in your urine.  You can complete the macrobid to make sure the bacteria clears.  If your symptoms are persistent please come back in for another appointment.

## 2022-12-07 ENCOUNTER — Telehealth: Payer: 59 | Admitting: Physician Assistant

## 2022-12-07 DIAGNOSIS — B9689 Other specified bacterial agents as the cause of diseases classified elsewhere: Secondary | ICD-10-CM | POA: Diagnosis not present

## 2022-12-07 DIAGNOSIS — J019 Acute sinusitis, unspecified: Secondary | ICD-10-CM

## 2022-12-07 MED ORDER — AMOXICILLIN-POT CLAVULANATE 875-125 MG PO TABS: 1.0000 | ORAL_TABLET | Freq: Two times a day (BID) | ORAL | 0 refills | Status: DC

## 2022-12-07 NOTE — Progress Notes (Signed)

## 2022-12-31 ENCOUNTER — Telehealth: Payer: 59 | Admitting: Urgent Care

## 2022-12-31 ENCOUNTER — Ambulatory Visit: Payer: Self-pay

## 2022-12-31 DIAGNOSIS — J4521 Mild intermittent asthma with (acute) exacerbation: Secondary | ICD-10-CM | POA: Diagnosis not present

## 2022-12-31 DIAGNOSIS — J302 Other seasonal allergic rhinitis: Secondary | ICD-10-CM | POA: Diagnosis not present

## 2022-12-31 DIAGNOSIS — J40 Bronchitis, not specified as acute or chronic: Secondary | ICD-10-CM | POA: Diagnosis not present

## 2022-12-31 DIAGNOSIS — J0101 Acute recurrent maxillary sinusitis: Secondary | ICD-10-CM | POA: Diagnosis not present

## 2022-12-31 MED ORDER — QVAR REDIHALER 80 MCG/ACT IN AERB: 1.0000 | INHALATION_SPRAY | Freq: Two times a day (BID) | RESPIRATORY_TRACT | 0 refills | Status: DC

## 2022-12-31 MED ORDER — PREDNISONE 50 MG PO TABS: 50.0000 mg | ORAL_TABLET | Freq: Every day | ORAL | 0 refills | Status: DC

## 2022-12-31 MED ORDER — DOXYCYCLINE HYCLATE 100 MG PO TABS: 100.0000 mg | ORAL_TABLET | Freq: Two times a day (BID) | ORAL | 0 refills | Status: AC

## 2022-12-31 MED ORDER — AZELASTINE HCL 0.1 % NA SOLN: 1.0000 | Freq: Two times a day (BID) | NASAL | 0 refills | Status: AC

## 2022-12-31 NOTE — Progress Notes (Signed)
Virtual Visit Consent   [name], you are scheduled for a virtual visit with a [facility] provider today. Just as with appointments in the office, your consent must be obtained to participate. Your consent will be active for this visit and any virtual visit you may have with one of our providers in the next 365 days. If you have a MyChart account, a copy of this consent can be sent to you electronically.  As this is a virtual visit, video technology does not allow for your provider to perform a traditional examination. This may limit your provider's ability to fully assess your condition. If your provider identifies any concerns that need to be evaluated in person or the need to arrange testing (such as labs, EKG, etc.), we will make arrangements to do so. Although advances in technology are sophisticated, we cannot ensure that it will always work on either your end or our end. If the connection with a video visit is poor, the visit may have to be switched to a telephone visit. With either a video or telephone visit, we are not always able to ensure that we have a secure connection.  By engaging in this virtual visit, you consent to the provision of healthcare and authorize for your insurance to be billed (if applicable) for the services provided during this visit. Depending on your insurance coverage, you may receive a charge related to this service.  I need to obtain your verbal consent now. Are you willing to proceed with your visit today? Ashley Kidd has provided verbal consent on 12/31/2022 for a virtual visit (video or telephone). Ashley Malling, PA  Date: 12/31/2022 11:57 AM  Virtual Visit via Video Note   I, [place], connected with  Ashley Kidd  ([id number], 04/19/1981) on 12/31/22 at 11:15 AM EDT by a video-enabled telemedicine application and verified that I am speaking with the correct person using two identifiers.  Location: Patient: Virtual Visit Location Patient:  Home Provider: Virtual Visit Location Provider: Home Office   I discussed the limitations of evaluation and management by telemedicine and the availability of in person appointments. The patient expressed understanding and agreed to proceed.    History of Present Illness: Ashley Kidd is a 42 y.o. who identifies as a female who was assigned female at birth, and is being seen today for asthma exacerbation.  HPI: 42yo wfemale with known hx of asthma and allergies presents today for sinus congestion, cough, drainage, post nasal drip, wheezing and tightness in chest, sneezing and rhinorrhea. Was tx for sinus infection with Augmentin 3 weeks ago, feels it helped some but did not resolve her symptoms. Feels that the pollen is flaring up her asthma. Takes zyrtec, flonase, singulair and spiriva daily. Used to be on Symbicort but insurance wouldn't pay for it. Pt states that URIs and allergies cause bronchitis, feels same as before. Has been using her albuterol inhaler with some improvement to her chest tightness. Has hx of DM, sugars controlled overall per pt. Has been on PO steroids in the past without significant hyperglycemia.    Problems:  Patient Active Problem List   Diagnosis Date Noted   Anxiety 07/21/2022   Chronic pain of right ankle 02/10/2022   Arthritis of knee 11/04/2021   Type 2 diabetes mellitus without complication, without long-term current use of insulin 07/26/2019   Moderate persistent asthma without complication 123XX123   C. difficile diarrhea 12/24/2015   Migraine    Allergy  Low back pain 03/26/2015   Chronic cholecystitis without calculus 05/19/2013    Allergies:  Allergies  Allergen Reactions   Hydrocodone Hives and Itching   Tussionex Pennkinetic Er [Hydrocod Poli-Chlorphe Poli Er] Hives and Itching   Bee Venom Hives   Dust Mite Extract    Metformin And Related Diarrhea   Medications:  Current Outpatient Medications:    azelastine (ASTELIN) 0.1 % nasal  spray, Place 1 spray into both nostrils 2 (two) times daily. Use in each nostril as directed, Disp: 30 mL, Rfl: 0   beclomethasone (QVAR REDIHALER) 80 MCG/ACT inhaler, Inhale 1 puff into the lungs 2 (two) times daily., Disp: 1 each, Rfl: 0   doxycycline (VIBRA-TABS) 100 MG tablet, Take 1 tablet (100 mg total) by mouth 2 (two) times daily for 7 days., Disp: 14 tablet, Rfl: 0   predniSONE (DELTASONE) 50 MG tablet, Take 1 tablet (50 mg total) by mouth daily with breakfast., Disp: 6 tablet, Rfl: 0   acetaminophen (TYLENOL) 325 MG tablet, Take by mouth., Disp: , Rfl:    albuterol (VENTOLIN HFA) 108 (90 Base) MCG/ACT inhaler, INHALE 2 PUFFS INTO THE LUNGS EVERY 6 HOURS AS NEEDED FOR WHEEZING, Disp: 18 g, Rfl: 6   aspirin 81 MG tablet, Take 81 mg by mouth daily., Disp: , Rfl:    azelastine (OPTIVAR) 0.05 % ophthalmic solution, 1 drop into affected eye Ophthalmic Twice a day for 30 days, Disp: , Rfl:    cetirizine (ZYRTEC) 10 MG tablet, Take 10 mg by mouth daily., Disp: , Rfl:    citalopram (CELEXA) 20 MG tablet, Take 1 tablet (20 mg total) by mouth daily., Disp: 90 tablet, Rfl: 1   EPINEPHrine 0.3 mg/0.3 mL IJ SOAJ injection, INJ 0.3 ML IM ONCE FOR 1 DOSE, Disp: 2 each, Rfl: 12   famotidine (PEPCID) 40 MG tablet, 1 tablet at bedtime Orally every 12 hrs for 30 days, Disp: , Rfl:    fluticasone (FLONASE) 50 MCG/ACT nasal spray, INSTILL 2 SPRAYS IN EACH NOSTRIL EVERY DAY, Disp: 16 g, Rfl: 12   levocetirizine (XYZAL) 5 MG tablet, 1 tablet in the evening Orally Once a day for 30 days, Disp: , Rfl:    mometasone (ELOCON) 0.1 % cream, 1 application Externally Once a day for 90 days, Disp: , Rfl:    montelukast (SINGULAIR) 10 MG tablet, TAKE 1 TABLET(10 MG) BY MOUTH AT BEDTIME, Disp: 90 tablet, Rfl: 1   norethindrone (AYGESTIN) 5 MG tablet, Take 5 mg by mouth daily., Disp: , Rfl:    omeprazole (PRILOSEC) 40 MG capsule, TAKE 1 CAPSULE(40 MG) BY MOUTH DAILY, Disp: 90 capsule, Rfl: 1   Probiotic Product (PROBIOTIC  DAILY PO), Take 1 capsule by mouth daily., Disp: , Rfl:    promethazine (PHENERGAN) 25 MG tablet, Take 1 tablet (25 mg total) by mouth every 6 (six) hours as needed for nausea or vomiting., Disp: 20 tablet, Rfl: 0   SUMAtriptan (IMITREX) 100 MG tablet, Take 1 tablet (100 mg total) by mouth every 2 (two) hours as needed for migraine. One tab at onset and may repeat x1 in 1 hour. Max 200 mg/24 hours, Disp: 10 tablet, Rfl: 12   tiotropium (SPIRIVA HANDIHALER) 18 MCG inhalation capsule, Place 1 capsule (18 mcg total) into inhaler and inhale daily., Disp: 30 capsule, Rfl: 12   triamcinolone cream (KENALOG) 0.1 %, Apply 1 Application topically 2 (two) times daily., Disp: 30 g, Rfl: 1  Observations/Objective: Patient is well-developed, well-nourished in no acute distress.  Resting at home,  very congested/ nasal quality voice. Acutely ill but non-toxic.  Head is normocephalic, atraumatic.  No labored breathing. Harsh cough noted on exam. No tachypnea or accessory muscle use. Speech is clear and coherent with logical content.  Patient is alert and oriented at baseline.  Rhinorrhea noted.  Assessment and Plan: 1. Mild intermittent asthma with acute exacerbation  2. Bronchitis  3. Seasonal allergies  4. Acute recurrent maxillary sinusitis  Pt with recurrent sinus sx despite recent tx with Augmentin in the setting of uncontrolled asthma and allergies. Pt will continue her home medications, will add doxycycline, prednisone and azelastine. Asthma was better controlled on Symbicort but is no longer covered under her insurance; will therefore do trial of Qvar which appears to be covered. This is preferred to PO steroids due to pt's hx of DM. Has taken steroids in the past without significant hyperglycemia.  Follow Up Instructions: I discussed the assessment and treatment plan with the patient. The patient was provided an opportunity to ask questions and all were answered. The patient agreed with the plan  and demonstrated an understanding of the instructions.  A copy of instructions were sent to the patient via MyChart unless otherwise noted below.    The patient was advised to call back or seek an in-person evaluation if the symptoms worsen or if the condition fails to improve as anticipated.  Time:  I spent 9 minutes with the patient via telehealth technology discussing the above problems/concerns.    [place], PA

## 2022-12-31 NOTE — Patient Instructions (Signed)
Ashley Kidd, thank you for joining Chaney Malling, PA for today's virtual visit.  While this provider is not your primary care provider (PCP), if your PCP is located in our provider database this encounter information will be shared with them immediately following your visit.   [facility] account gives you access to today's visit and all your visits, tests, and labs performed at [facility] " click here if you don't have a [facility] account or go to [email or web address]  Consent: (Patient) Ashley Kidd provided verbal consent for this virtual visit at the beginning of the encounter.  Current Medications:  Current Outpatient Medications:    azelastine (ASTELIN) 0.1 % nasal spray, Place 1 spray into both nostrils 2 (two) times daily. Use in each nostril as directed, Disp: 30 mL, Rfl: 0   beclomethasone (QVAR REDIHALER) 80 MCG/ACT inhaler, Inhale 1 puff into the lungs 2 (two) times daily., Disp: 1 each, Rfl: 0   doxycycline (VIBRA-TABS) 100 MG tablet, Take 1 tablet (100 mg total) by mouth 2 (two) times daily for 7 days., Disp: 14 tablet, Rfl: 0   predniSONE (DELTASONE) 50 MG tablet, Take 1 tablet (50 mg total) by mouth daily with breakfast., Disp: 6 tablet, Rfl: 0   acetaminophen (TYLENOL) 325 MG tablet, Take by mouth., Disp: , Rfl:    albuterol (VENTOLIN HFA) 108 (90 Base) MCG/ACT inhaler, INHALE 2 PUFFS INTO THE LUNGS EVERY 6 HOURS AS NEEDED FOR WHEEZING, Disp: 18 g, Rfl: 6   aspirin 81 MG tablet, Take 81 mg by mouth daily., Disp: , Rfl:    azelastine (OPTIVAR) 0.05 % ophthalmic solution, 1 drop into affected eye Ophthalmic Twice a day for 30 days, Disp: , Rfl:    cetirizine (ZYRTEC) 10 MG tablet, Take 10 mg by mouth daily., Disp: , Rfl:    citalopram (CELEXA) 20 MG tablet, Take 1 tablet (20 mg total) by mouth daily., Disp: 90 tablet, Rfl: 1   EPINEPHrine 0.3 mg/0.3 mL IJ SOAJ injection, INJ 0.3 ML IM ONCE FOR 1 DOSE, Disp: 2 each, Rfl: 12    famotidine (PEPCID) 40 MG tablet, 1 tablet at bedtime Orally every 12 hrs for 30 days, Disp: , Rfl:    fluticasone (FLONASE) 50 MCG/ACT nasal spray, INSTILL 2 SPRAYS IN EACH NOSTRIL EVERY DAY, Disp: 16 g, Rfl: 12   levocetirizine (XYZAL) 5 MG tablet, 1 tablet in the evening Orally Once a day for 30 days, Disp: , Rfl:    mometasone (ELOCON) 0.1 % cream, 1 application Externally Once a day for 90 days, Disp: , Rfl:    montelukast (SINGULAIR) 10 MG tablet, TAKE 1 TABLET(10 MG) BY MOUTH AT BEDTIME, Disp: 90 tablet, Rfl: 1   norethindrone (AYGESTIN) 5 MG tablet, Take 5 mg by mouth daily., Disp: , Rfl:    omeprazole (PRILOSEC) 40 MG capsule, TAKE 1 CAPSULE(40 MG) BY MOUTH DAILY, Disp: 90 capsule, Rfl: 1   Probiotic Product (PROBIOTIC DAILY PO), Take 1 capsule by mouth daily., Disp: , Rfl:    promethazine (PHENERGAN) 25 MG tablet, Take 1 tablet (25 mg total) by mouth every 6 (six) hours as needed for nausea or vomiting., Disp: 20 tablet, Rfl: 0   SUMAtriptan (IMITREX) 100 MG tablet, Take 1 tablet (100 mg total) by mouth every 2 (two) hours as needed for migraine. One tab at onset and may repeat x1 in 1 hour. Max 200 mg/24 hours, Disp: 10 tablet, Rfl: 12   tiotropium (SPIRIVA HANDIHALER) 18 MCG inhalation capsule,  Place 1 capsule (18 mcg total) into inhaler and inhale daily., Disp: 30 capsule, Rfl: 12   triamcinolone cream (KENALOG) 0.1 %, Apply 1 Application topically 2 (two) times daily., Disp: 30 g, Rfl: 1   Medications ordered in this encounter:  Meds ordered this encounter  Medications   predniSONE (DELTASONE) 50 MG tablet    Sig: Take 1 tablet (50 mg total) by mouth daily with breakfast.    Dispense:  6 tablet    Refill:  0    Order Specific Question:   Supervising Provider    Answer:   Chase Picket [id number]   beclomethasone (QVAR REDIHALER) 80 MCG/ACT inhaler    Sig: Inhale 1 puff into the lungs 2 (two) times daily.    Dispense:  1 each    Refill:  0    Order Specific Question:    Supervising Provider    Answer:   Bari Mantis   doxycycline (VIBRA-TABS) 100 MG tablet    Sig: Take 1 tablet (100 mg total) by mouth 2 (two) times daily for 7 days.    Dispense:  14 tablet    Refill:  0    Order Specific Question:   Supervising Provider    Answer:   Chase Picket [id number]   azelastine (ASTELIN) 0.1 % nasal spray    Sig: Place 1 spray into both nostrils 2 (two) times daily. Use in each nostril as directed    Dispense:  30 mL    Refill:  0    Order Specific Question:   Supervising Provider    Answer:   Chase Picket [id number]     *If you need refills on other medications prior to your next appointment, please contact your pharmacy*  Follow-Up: Call back or seek an in-person evaluation if the symptoms worsen or if the condition fails to improve as anticipated.  [facility] [phone]  Other Instructions Continue all your home allergy and asthma medications. Start taking Qvar, an inhaled steroid, daily. Take one puff twice daily. Rinse out your mouth after each use to prevent oral thrush. Continue your rescue inhaler 2 puffs every 4-6 hours as needed. Start prednisone once daily in the morning until gone. Decrease your carbohydrate intake while on this medication to prevent spikes in your sugar levels. Start doxycycline twice daily for your sinus congestion. Add azelastine nasal spray to your saline flushes and flonase.  Follow up with your PCP or head to UC if any new or worsening symptoms.    If you have been instructed to have an in-person evaluation today at a local Urgent Care facility, please use the link below. It will take you to a list of all of our available [facility], including address, phone number and hours of operation. Please do not delay care.  [facility]  If you or a family member do not have a primary care provider, use the link below to schedule a visit and establish care. When  you choose a [facility] primary care physician or advanced practice provider, you gain a long-term partner in health. Find a Primary Care Provider  Learn more about [facility]'s in-office and virtual care options: [facility]

## 2022-12-31 NOTE — Telephone Encounter (Signed)
    Chief Complaint: Cough with green mucus, crusty eyes, runny nose, chest tightness with coughing Symptoms: Above Frequency: 1 week ago Pertinent Negatives: Patient denies fever Disposition: [] ED /[] Urgent Care (no appt availability in office) / [] Appointment(In office/virtual)/ [x]  [facility]/ [] Home Care/ [] Refused Recommended Disposition /[] [facility]/ []  Follow-up with PCP Additional Notes:   Reason for Disposition  [1] MILD difficulty breathing (e.g., minimal/no SOB at rest, SOB with walking, pulse <100) AND [2] still present when not coughing  Answer Assessment - Initial Assessment Questions 1. ONSET: "When did the cough begin?"      1 week 2. SEVERITY: "How bad is the cough today?"      Severe 3. SPUTUM: "Describe the color of your sputum" (none, dry cough; clear, white, yellow, green)     Green 4. HEMOPTYSIS: "Are you coughing up any blood?" If so ask: "How much?" (flecks, streaks, tablespoons, etc.)     No 5. DIFFICULTY BREATHING: "Are you having difficulty breathing?" If Yes, ask: "How bad is it?" (e.g., mild, moderate, severe)    - MILD: No SOB at rest, mild SOB with walking, speaks normally in sentences, can lie down, no retractions, pulse < 100.    - MODERATE: SOB at rest, SOB with minimal exertion and prefers to sit, cannot lie down flat, speaks in phrases, mild retractions, audible wheezing, pulse 100-120.    - SEVERE: Very SOB at rest, speaks in single words, struggling to breathe, sitting hunched forward, retractions, pulse > 120      Mild with coughing 6. FEVER: "Do you have a fever?" If Yes, ask: "What is your temperature, how was it measured, and when did it start?"     No 7. CARDIAC HISTORY: "Do you have any history of heart disease?" (e.g., heart attack, congestive heart failure)      No 8. LUNG HISTORY: "Do you have any history of lung disease?"  (e.g., pulmonary embolus, asthma, emphysema)     Asthma  9. PE RISK FACTORS: "Do you have  a history of blood clots?" (or: recent major surgery, recent prolonged travel, bedridden)     No 10. OTHER SYMPTOMS: "Do you have any other symptoms?" (e.g., runny nose, wheezing, chest pain)       Runny nose, eyes crusty, mild wheezing 11. PREGNANCY: "Is there any chance you are pregnant?" "When was your last menstrual period?"       No 12. TRAVEL: "Have you traveled out of the country in the last month?" (e.g., travel history, exposures)       No  Protocols used: Cough - Acute Productive-A-AH

## 2023-01-02 IMAGING — MR MR ANKLE*R* W/O CM
5 series · 40 of 40 positions shown · non-contrast
Comparison: Radiographs dated February 10, 2022

CLINICAL DATA: Ankle pain, tendon abnormality suspected.

EXAM:
MRI OF THE RIGHT ANKLE WITHOUT CONTRAST
TECHNIQUE: Multiplanar, multisequence MR imaging of the ankle was performed. No
intravenous contrast was administered.

[Series 3: PD fat-sat · axial · right · 3.0mm · 0.50mm/px · z∈[-92,+48]mm · 10 of 36 slices shown]
[im 1/36]
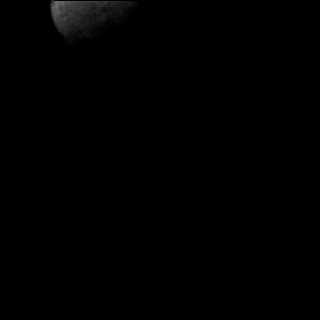
[im 4/36]
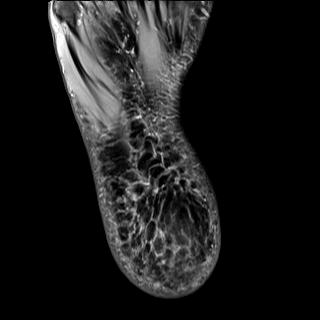
[im 8/36]
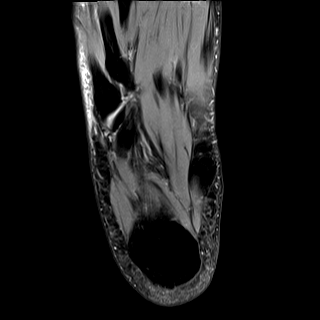
[im 12/36]
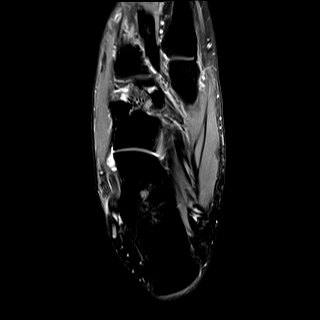
[im 16/36]
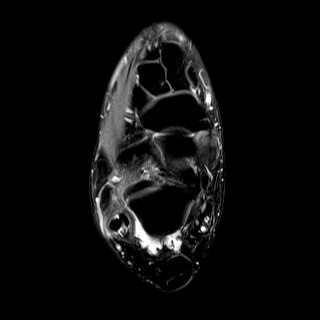
[im 20/36]
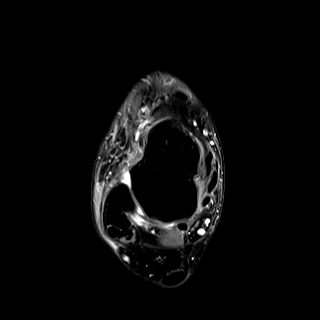
[im 24/36]
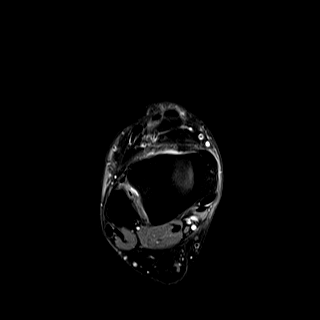
[im 28/36]
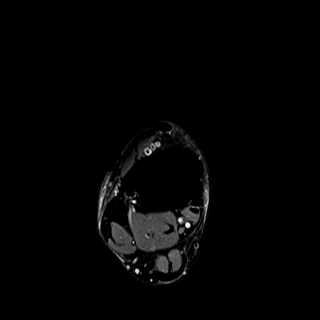
[im 32/36]
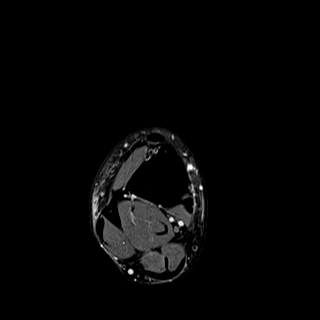
[im 36/36]
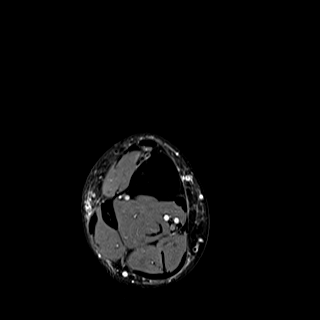

[Series 4: T2 fat-sat · axial · right · 3.0mm · 0.50mm/px · z∈[-92,+48]mm · 10 of 36 slices shown]
[im 1/36]
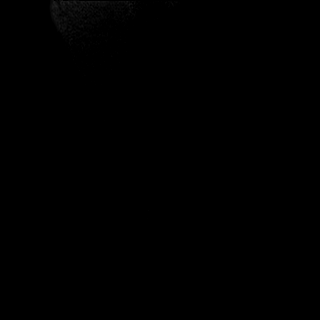
[im 4/36]
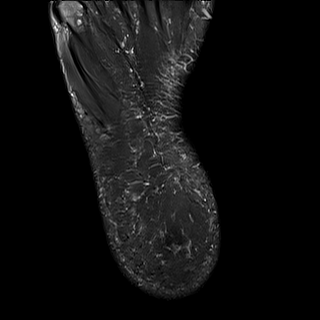
[im 8/36]
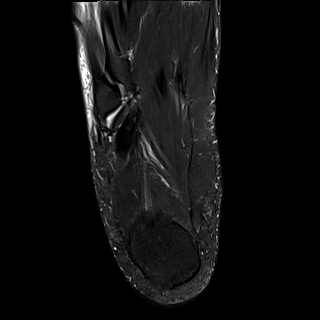
[im 12/36]
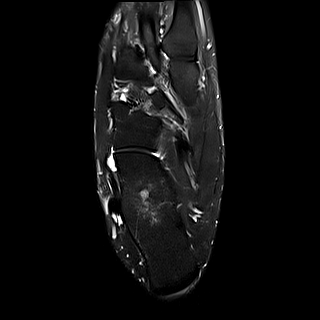
[im 16/36]
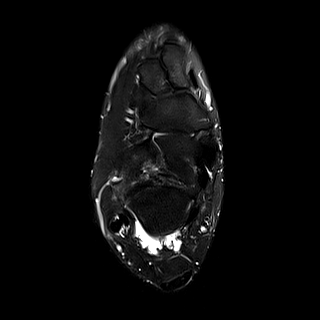
[im 20/36]
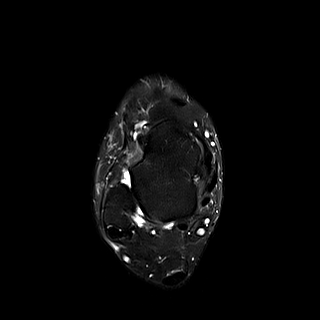
[im 24/36]
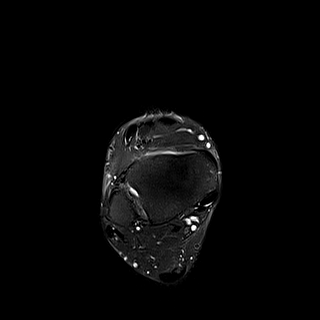
[im 28/36]
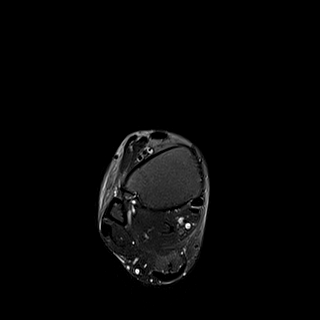
[im 32/36]
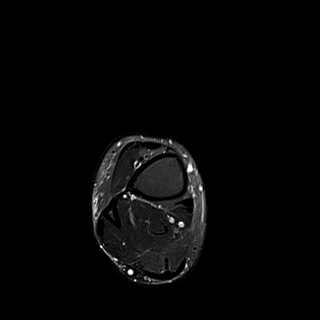
[im 36/36]
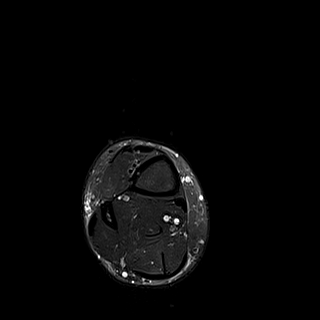

[Series 6: T2 · coronal · right · 3.0mm · 0.62mm/px · 10 of 40 slices shown]
[im 1/40]
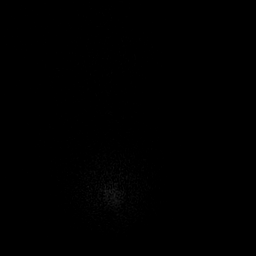
[im 5/40]
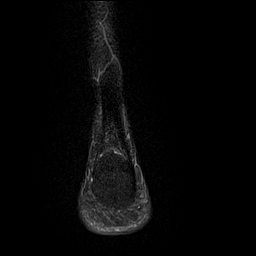
[im 9/40]
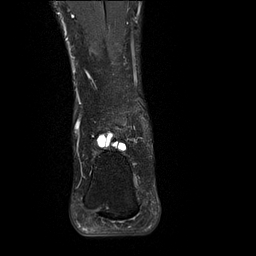
[im 14/40]
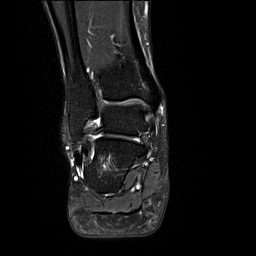
[im 18/40]
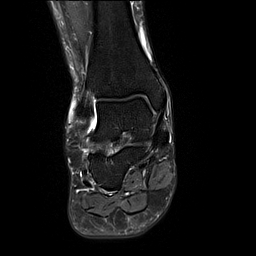
[im 22/40]
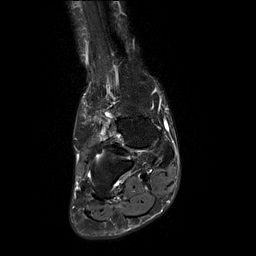
[im 27/40]
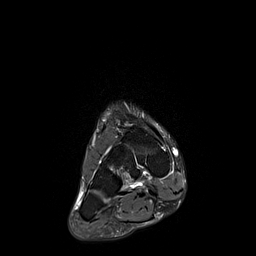
[im 31/40]
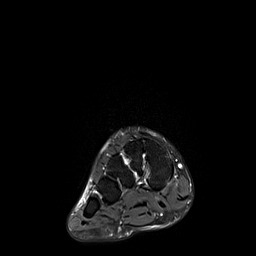
[im 35/40]
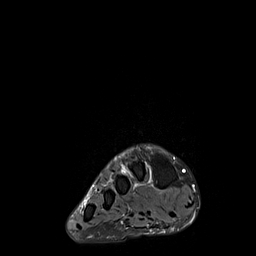
[im 40/40]
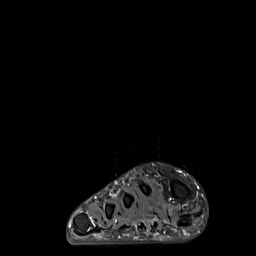

[Series 7: T1 · sagittal · right · 4.0mm · 0.70mm/px · 5 of 21 slices shown]
[im 1/21]
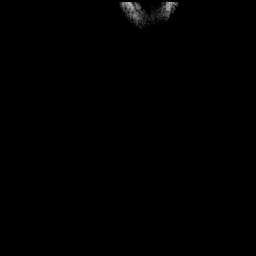
[im 6/21]
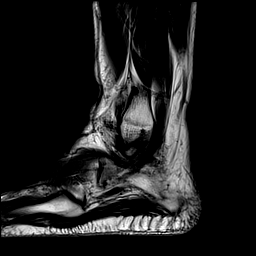
[im 11/21]
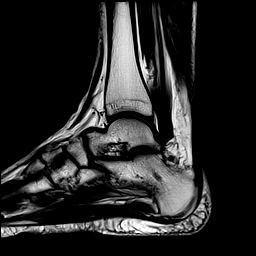
[im 16/21]
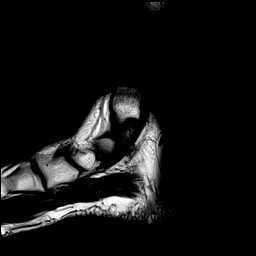
[im 21/21]
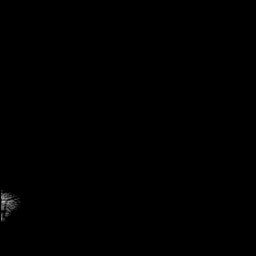

[Series 8: STIR · sagittal · right · 4.0mm · 0.35mm/px · 5 of 21 slices shown]
[im 1/21]
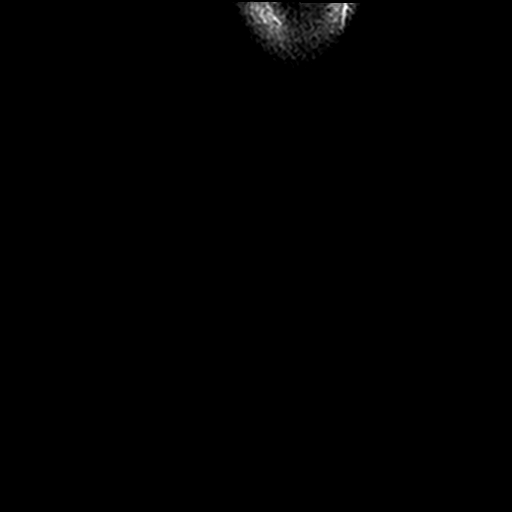
[im 6/21]
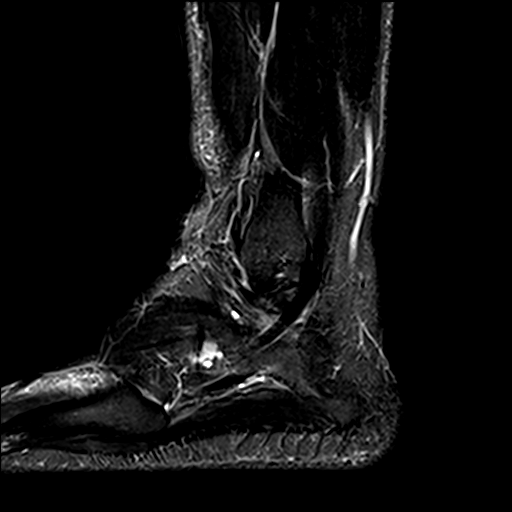
[im 11/21]
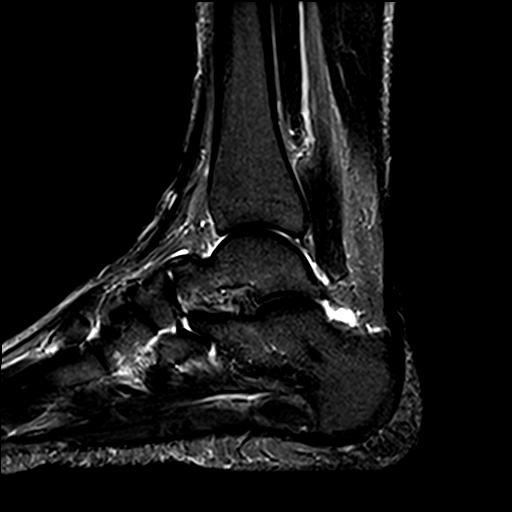
[im 16/21]
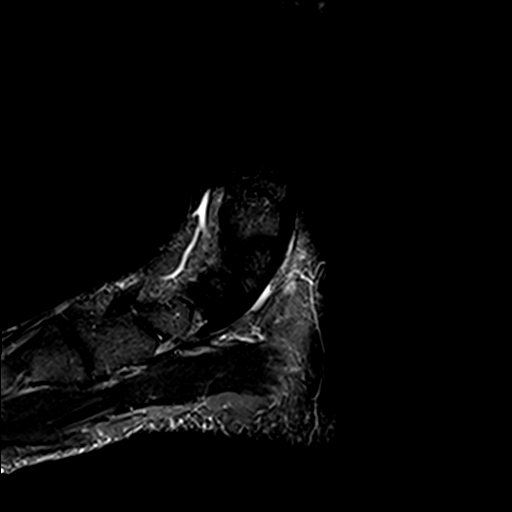
[im 21/21]
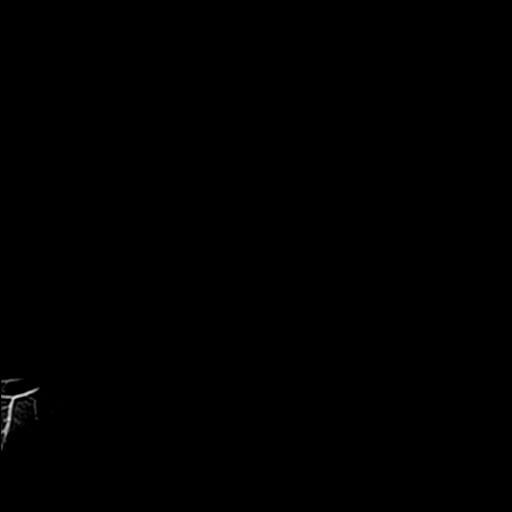

[40 of 40 positions shown; findings below may reference images not displayed]

FINDINGS: TENDONS

Peroneal: Peroneal longus tendon intact. Peroneal brevis intact.
Trace amount of fluid along the peroneus brevis and longus
concerning for tenosynovitis.

Posteromedial: Posterior tibial tendon intact. Flexor hallucis
longus tendon intact. Flexor digitorum longus tendon intact.

Anterior: Tibialis anterior tendon intact. Extensor hallucis longus
tendon intact Extensor digitorum longus tendon intact.

Achilles:  Intact.

Plantar Fascia: Intact.

LIGAMENTS

Lateral: Anterior talofibular ligament intact. Calcaneofibular
ligament intact. Posterior talofibular ligament intact. Anterior and
posterior tibiofibular ligaments intact.

Medial: Deltoid ligament intact. Spring ligament intact.

CARTILAGE

Ankle Joint: Small joint effusion. Normal ankle mortise. No chondral
defect.

Subtalar Joints/Sinus Tarsi: Normal subtalar joints. Small subtalar
joint effusion. Normal sinus tarsi.

Bones: Mild nonspecific marrow edema of the calcaneal neck region.
No fracture or dislocation.

Soft Tissue: No fluid collection or hematoma. Muscles are normal
without edema or atrophy. Tarsal tunnel is normal.
IMPRESSION: 1. Small joint effusion of the ankle joint and about the subtalar
joint. No evidence of fracture or osteonecrosis. No osteochondral
injury.

2. Mild thickening and edema about the peroneal tendons concerning
for tenosynovitis. This could be sequela of prior trauma.

3.  Ligaments of the medial and lateral compartment are intact.

4. Nonspecific edema of the calcaneal neck region without evidence
of fracture, it likely represents bone contusion.

## 2023-01-20 ENCOUNTER — Encounter: Payer: Self-pay | Admitting: Family Medicine

## 2023-01-20 ENCOUNTER — Ambulatory Visit: Payer: 59 | Admitting: Family Medicine

## 2023-01-20 VITALS — BP 130/86 | HR 89 | Temp 98.1°F | Ht 67.0 in | Wt 217.9 lb

## 2023-01-20 DIAGNOSIS — J4540 Moderate persistent asthma, uncomplicated: Secondary | ICD-10-CM

## 2023-01-20 DIAGNOSIS — L309 Dermatitis, unspecified: Secondary | ICD-10-CM | POA: Diagnosis not present

## 2023-01-20 DIAGNOSIS — E119 Type 2 diabetes mellitus without complications: Secondary | ICD-10-CM

## 2023-01-20 DIAGNOSIS — R8281 Pyuria: Secondary | ICD-10-CM

## 2023-01-20 DIAGNOSIS — G43709 Chronic migraine without aura, not intractable, without status migrainosus: Secondary | ICD-10-CM | POA: Diagnosis not present

## 2023-01-20 DIAGNOSIS — F419 Anxiety disorder, unspecified: Secondary | ICD-10-CM

## 2023-01-20 LAB — URINALYSIS, ROUTINE W REFLEX MICROSCOPIC
Bilirubin, UA: NEGATIVE
Glucose, UA: NEGATIVE
Ketones, UA: NEGATIVE
Nitrite, UA: NEGATIVE
Protein,UA: NEGATIVE
Specific Gravity, UA: 1.01 (ref 1.005–1.030)
Urobilinogen, Ur: 0.2 mg/dL (ref 0.2–1.0)
pH, UA: 6 (ref 5.0–7.5)

## 2023-01-20 LAB — MICROSCOPIC EXAMINATION: Bacteria, UA: NONE SEEN

## 2023-01-20 LAB — MICROALBUMIN, URINE WAIVED
Creatinine, Urine Waived: 10 mg/dL (ref 10–300)
Microalb, Ur Waived: 10 mg/L (ref 0–19)

## 2023-01-20 LAB — BAYER DCA HB A1C WAIVED: HB A1C (BAYER DCA - WAIVED): 8.6 % — ABNORMAL HIGH (ref 4.8–5.6)

## 2023-01-20 MED ORDER — SUMATRIPTAN SUCCINATE 100 MG PO TABS: 100.0000 mg | ORAL_TABLET | ORAL | 12 refills | Status: DC | PRN

## 2023-01-20 MED ORDER — CITALOPRAM HYDROBROMIDE 20 MG PO TABS: 20.0000 mg | ORAL_TABLET | Freq: Every day | ORAL | 1 refills | Status: DC

## 2023-01-20 MED ORDER — RYBELSUS 7 MG PO TABS: 7.0000 mg | ORAL_TABLET | Freq: Every day | ORAL | 1 refills | Status: DC

## 2023-01-20 MED ORDER — AIRSUPRA 90-80 MCG/ACT IN AERO: 2.0000 | INHALATION_SPRAY | RESPIRATORY_TRACT | 6 refills | Status: DC

## 2023-01-20 MED ORDER — QVAR REDIHALER 80 MCG/ACT IN AERB: 1.0000 | INHALATION_SPRAY | Freq: Two times a day (BID) | RESPIRATORY_TRACT | 4 refills | Status: DC

## 2023-01-20 MED ORDER — TRIAMCINOLONE ACETONIDE 0.5 % EX OINT: 1.0000 | TOPICAL_OINTMENT | Freq: Two times a day (BID) | CUTANEOUS | 0 refills | Status: DC

## 2023-01-20 MED ORDER — OMEPRAZOLE 40 MG PO CPDR: DELAYED_RELEASE_CAPSULE | ORAL | 1 refills | Status: DC

## 2023-01-20 MED ORDER — TIOTROPIUM BROMIDE MONOHYDRATE 18 MCG IN CAPS: 18.0000 ug | ORAL_CAPSULE | Freq: Every day | RESPIRATORY_TRACT | 12 refills | Status: DC

## 2023-01-20 MED ORDER — ALBUTEROL SULFATE HFA 108 (90 BASE) MCG/ACT IN AERS: INHALATION_SPRAY | RESPIRATORY_TRACT | 6 refills | Status: DC

## 2023-01-20 MED ORDER — RYBELSUS 3 MG PO TABS: 3.0000 mg | ORAL_TABLET | Freq: Every day | ORAL | 0 refills | Status: DC

## 2023-01-20 MED ORDER — MONTELUKAST SODIUM 10 MG PO TABS: ORAL_TABLET | ORAL | 1 refills | Status: DC

## 2023-01-20 MED ORDER — FLUTICASONE PROPIONATE 50 MCG/ACT NA SUSP: NASAL | 12 refills | Status: AC

## 2023-01-20 NOTE — Progress Notes (Unsigned)
BP 130/86   Pulse 89   Temp 98.1 F (36.7 C) (Oral)   Ht  (1.702 m)   Wt 217 lb 14.4 oz (98.8 kg)   LMP 03/24/2022 (Approximate)   SpO2 99%   BMI 34.13 kg/m    Subjective:    Patient ID: Ashley Kidd, female    DOB: August 13, 1981, 42 y.o.   MRN: [id number]  HPI: Ashley Kidd is a 43 y.o. female  Chief Complaint  Patient presents with   Diabetes   DIABETES Hypoglycemic episodes:{Blank single:19197::"yes","no"} Polydipsia/polyuria: {Blank single:19197::"yes","no"} Visual disturbance: {Blank single:19197::"yes","no"} Chest pain: {Blank single:19197::"yes","no"} Paresthesias: {Blank single:19197::"yes","no"} Glucose Monitoring: {Blank single:19197::"yes","no"}  Accucheck frequency: {Blank single:19197::"Not Checking","Daily","BID","TID"}  Fasting glucose:  Post prandial:  Evening:  Before meals: Taking Insulin?: {Blank single:19197::"yes","no"}  Long acting insulin:  Short acting insulin: Blood Pressure Monitoring: {Blank single:19197::"not checking","rarely","daily","weekly","monthly","a few times a day","a few times a week","a few times a month"} Retinal Examination: {Blank single:19197::"Up to Date","Not up to Date"} Foot Exam: {Blank single:19197::"Up to Date","Not up to Date"} Diabetic Education: {Blank single:19197::"Completed","Not Completed"} Pneumovax: {Blank single:19197::"Up to Date","Not up to Date","unknown"} Influenza: {Blank single:19197::"Up to Date","Not up to Date","unknown"} Aspirin: {Blank single:19197::"yes","no"}  ANXIETY/STRESS Duration:{Blank single:19197::"controlled","uncontrolled","better","worse","exacerbated","stable"} Anxious mood: {Blank single:19197::"yes","no"}  Excessive worrying: {Blank single:19197::"yes","no"} Irritability: {Blank single:19197::"yes","no"}  Sweating: {Blank single:19197::"yes","no"} Nausea: {Blank single:19197::"yes","no"} Palpitations:{Blank single:19197::"yes","no"} Hyperventilation: {Blank  single:19197::"yes","no"} Panic attacks: {Blank single:19197::"yes","no"} Agoraphobia: {Blank single:19197::"yes","no"}  Obscessions/compulsions: {Blank single:19197::"yes","no"} Depressed mood: {Blank single:19197::"yes","no"}    01/20/2023    9:00 AM 10/21/2022    8:52 AM 09/29/2022    8:49 AM 07/21/2022    8:50 AM 02/10/2022    1:56 PM  Depression screen PHQ 2/9  Decreased Interest 0 0 1 1 0  Down, Depressed, Hopeless 0 0 1 1 0  PHQ - 2 Score 0 0 2 2 0  Altered sleeping Tired, decreased energy Change in appetite Feeling bad or failure about yourself  0 0 0 0 0  Trouble concentrating 1 0 Moving slowly or fidgety/restless 0 0 0 1 0  Suicidal thoughts 0 0 0 0 0  PHQ-9 Score Difficult doing work/chores Somewhat difficult Somewhat difficult Somewhat difficult     Anhedonia: {Blank single:19197::"yes","no"} Weight changes: {Blank single:19197::"yes","no"} Insomnia: {Blank single:19197::"yes","no"} {Blank single:19197::"hard to fall asleep","hard to stay asleep"}  Hypersomnia: {Blank single:19197::"yes","no"} Fatigue/loss of energy: {Blank single:19197::"yes","no"} Feelings of worthlessness: {Blank single:19197::"yes","no"} Feelings of guilt: {Blank single:19197::"yes","no"} Impaired concentration/indecisiveness: {Blank single:19197::"yes","no"} Suicidal ideations: {Blank single:19197::"yes","no"}  Crying spells: {Blank single:19197::"yes","no"} Recent Stressors/Life Changes: {Blank single:19197::"yes","no"}   Relationship problems: {Blank single:19197::"yes","no"}   Family stress: {Blank single:19197::"yes","no"}     Financial stress: {Blank single:19197::"yes","no"}    Job stress: {Blank single:19197::"yes","no"}    Recent death/loss: {Blank single:19197::"yes","no"}  Relevant past medical, surgical, family and social history reviewed and updated as indicated. Interim medical history since our last visit reviewed. Allergies and  medications reviewed and updated.  Review of Systems  Per HPI unless specifically indicated above     Objective:    BP 130/86   Pulse 89   Temp 98.1 F (36.7 C) (Oral)   Ht  (1.702 m)   Wt 217 lb 14.4 oz (98.8 kg)   LMP 03/24/2022 (Approximate)   SpO2 99%   BMI 34.13 kg/m   Wt Readings from Last 3 Encounters:  01/20/23 217 lb 14.4 oz (98.8 kg)  11/05/22 212 lb 11.2  oz (96.5 kg)  10/21/22 207 lb 11.2 oz (94.2 kg)    Physical Exam  Results for orders placed or performed in visit on 11/05/22  Urine Culture   Specimen: Urine   Urine  Result Value Ref Range   Urine Culture, Routine Final report    Organism ID, Bacteria Comment   Microscopic Examination   Urine  Result Value Ref Range   WBC, UA None seen 0 - 5 /hpf   RBC, Urine None seen 0 - 2 /hpf   Epithelial Cells (non renal) 0-10 0 - 10 /hpf   Casts None seen None seen /lpf   Bacteria, UA Moderate (A) None seen/Few  Urinalysis, Routine w reflex microscopic  Result Value Ref Range   Specific Gravity, UA      <=1.005 (A) 1.005 - 1.030   pH, UA 6.5 5.0 - 7.5   Color, UA Yellow Yellow   Appearance Ur Clear Clear   Leukocytes,UA Trace (A) Negative   Protein,UA Negative Negative/Trace   Glucose, UA Negative Negative   Ketones, UA Negative Negative   RBC, UA Negative Negative   Bilirubin, UA Negative Negative   Urobilinogen, Ur 0.2 0.2 - 1.0 mg/dL   Nitrite, UA Negative Negative   Microscopic Examination See below:       Assessment & Plan:   Problem List Items Addressed This Visit       Endocrine   Type 2 diabetes mellitus without complication, without long-term current use of insulin - Primary   Relevant Orders   CBC with Differential/Platelet   Comprehensive metabolic panel   Lipid Panel w/o Chol/HDL Ratio   Urinalysis, Routine w reflex microscopic   TSH   Microalbumin, Urine Waived   Bayer DCA Hb A1c Waived     Follow up plan: No follow-ups on file.

## 2023-01-21 ENCOUNTER — Encounter: Payer: 59 | Admitting: Family Medicine

## 2023-01-21 ENCOUNTER — Encounter: Payer: Self-pay | Admitting: Family Medicine

## 2023-01-21 LAB — COMPREHENSIVE METABOLIC PANEL
ALT: 23 IU/L (ref 0–32)
AST: 20 IU/L (ref 0–40)
Albumin/Globulin Ratio: 1.6 (ref 1.2–2.2)
Albumin: 4.4 g/dL (ref 3.9–4.9)
Alkaline Phosphatase: 138 IU/L — ABNORMAL HIGH (ref 44–121)
BUN/Creatinine Ratio: 9 (ref 9–23)
BUN: 6 mg/dL (ref 6–24)
Bilirubin Total: 0.2 mg/dL (ref 0.0–1.2)
CO2: 20 mmol/L (ref 20–29)
Calcium: 9.5 mg/dL (ref 8.7–10.2)
Chloride: 99 mmol/L (ref 96–106)
Creatinine, Ser: 0.68 mg/dL (ref 0.57–1.00)
Globulin, Total: 2.7 g/dL (ref 1.5–4.5)
Glucose: 198 mg/dL — ABNORMAL HIGH (ref 70–99)
Potassium: 5 mmol/L (ref 3.5–5.2)
Sodium: 136 mmol/L (ref 134–144)
Total Protein: 7.1 g/dL (ref 6.0–8.5)
eGFR: 112 mL/min/{1.73_m2} (ref >59)

## 2023-01-21 LAB — CBC WITH DIFFERENTIAL/PLATELET
Basophils Absolute: 0.1 10*3/uL (ref 0.0–0.2)
Basos: 1 %
EOS (ABSOLUTE): 0.2 10*3/uL (ref 0.0–0.4)
Eos: 1 %
Hematocrit: 38.9 % (ref 34.0–46.6)
Hemoglobin: 12.1 g/dL (ref 11.1–15.9)
Immature Grans (Abs): 0.1 10*3/uL (ref 0.0–0.1)
Immature Granulocytes: 1 %
Lymphocytes Absolute: 3 10*3/uL (ref 0.7–3.1)
Lymphs: 26 %
MCH: 23.9 pg — ABNORMAL LOW (ref 26.6–33.0)
MCHC: 31.1 g/dL — ABNORMAL LOW (ref 31.5–35.7)
MCV: 77 fL — ABNORMAL LOW (ref 79–97)
Monocytes Absolute: 0.7 10*3/uL (ref 0.1–0.9)
Monocytes: 6 %
Neutrophils Absolute: 7.3 10*3/uL — ABNORMAL HIGH (ref 1.4–7.0)
Neutrophils: 65 %
Platelets: 607 10*3/uL — ABNORMAL HIGH (ref 150–450)
RBC: 5.07 x10E6/uL (ref 3.77–5.28)
RDW: 14.1 % (ref 11.7–15.4)
WBC: 11.4 10*3/uL — ABNORMAL HIGH (ref 3.4–10.8)

## 2023-01-21 LAB — LIPID PANEL W/O CHOL/HDL RATIO
Cholesterol, Total: 227 mg/dL — ABNORMAL HIGH (ref 100–199)
HDL: 35 mg/dL — ABNORMAL LOW (ref >39)
LDL Chol Calc (NIH): 149 mg/dL — ABNORMAL HIGH (ref 0–99)
Triglycerides: 234 mg/dL — ABNORMAL HIGH (ref 0–149)
VLDL Cholesterol Cal: 43 mg/dL — ABNORMAL HIGH (ref 5–40)

## 2023-01-21 LAB — TSH: TSH: 1.57 u[IU]/mL (ref 0.450–4.500)

## 2023-01-21 NOTE — Assessment & Plan Note (Signed)
Under good control on current regimen. Continue current regimen. Continue to monitor. Call with any concerns. Refills given.   

## 2023-01-21 NOTE — Assessment & Plan Note (Signed)
Up with A1c of 8.6. Will start rybelsus and recheck in 3 months. Call with any concerns.

## 2023-01-23 LAB — URINE CULTURE

## 2023-01-27 ENCOUNTER — Encounter: Payer: Self-pay | Admitting: Family Medicine

## 2023-01-27 LAB — URINE CULTURE

## 2023-01-29 ENCOUNTER — Other Ambulatory Visit: Payer: Self-pay | Admitting: Family Medicine

## 2023-01-29 MED ORDER — NITROFURANTOIN MONOHYD MACRO 100 MG PO CAPS: 100.0000 mg | ORAL_CAPSULE | Freq: Two times a day (BID) | ORAL | 0 refills | Status: DC

## 2023-03-28 ENCOUNTER — Emergency Department
Admission: EM | Admit: 2023-03-28 | Discharge: 2023-03-29 | Disposition: A | Discharge status: Home / Self Care | Payer: 59 | Attending: Emergency Medicine | Admitting: Emergency Medicine

## 2023-03-28 ENCOUNTER — Emergency Department: Payer: 59

## 2023-03-28 ENCOUNTER — Other Ambulatory Visit: Payer: Self-pay

## 2023-03-28 DIAGNOSIS — Y92015 Private garage of single-family (private) house as the place of occurrence of the external cause: Secondary | ICD-10-CM | POA: Insufficient documentation

## 2023-03-28 DIAGNOSIS — S99922A Unspecified injury of left foot, initial encounter: Secondary | ICD-10-CM | POA: Diagnosis present

## 2023-03-28 DIAGNOSIS — W01198A Fall on same level from slipping, tripping and stumbling with subsequent striking against other object, initial encounter: Secondary | ICD-10-CM | POA: Insufficient documentation

## 2023-03-28 DIAGNOSIS — W19XXXA Unspecified fall, initial encounter: Secondary | ICD-10-CM

## 2023-03-28 DIAGNOSIS — Y9302 Activity, running: Secondary | ICD-10-CM | POA: Insufficient documentation

## 2023-03-28 NOTE — ED Provider Notes (Signed)
[facility] Provider Note    Event Date/Time   First MD Initiated Contact with Patient 03/28/23 2318     (approximate)   History   Fall (Ankle pain)   HPI  Ashley Kidd is a 42 y.o. female who presents to the ED for evaluation of Fall (Ankle pain)   Patient presents alongside her husband for evaluation of left foot pain after a fall.  Reports that she was wearing flip-flops going to the garage when she slipped and fell and struck her foot on the way down.  No head injury or syncope.  No other areas of concern.   Physical Exam   Triage Vital Signs: ED Triage Vitals  Enc Vitals Group     BP 03/28/23 2303 (!) 165/93     Pulse Rate 03/28/23 2303 100     Resp 03/28/23 2303 16     Temp 03/28/23 2303 98 F (36.7 C)     Temp Source 03/28/23 2303 Oral     SpO2 03/28/23 2303 98 %     Weight 03/28/23 2304 216 lb 0.8 oz (98 kg)     Height 03/28/23 2304 5\' 7"  (1.702 m)     Head Circumference --      Peak Flow --      Pain Score 03/28/23 2303 0     Pain Loc --      Pain Edu? --      Excl. in GC? --     Most recent vital signs: Vitals:   03/28/23 2303  BP: (!) 165/93  Pulse: 100  Resp: 16  Temp: 98 F (36.7 C)  SpO2: 98%    General: Awake, no distress.  CV:  Good peripheral perfusion.  Resp:  Normal effort.  Abd:  No distention.  MSK:  No deformity noted.  Neuro:  No focal deficits appreciated. Other:  Pain over metatarsals 1-2 on the left without evidence of open injury.  No calf or ankle tenderness.   ED Results / Procedures / Treatments   Labs (all labs ordered are listed, but only abnormal results are displayed) Labs Reviewed - No data to display  EKG   RADIOLOGY Plain film of the left foot interpreted by me without evidence of fracture or dislocation  Official radiology report(s): DG Foot 2 Views Left  Result Date: 03/28/2023 CLINICAL DATA:  Recent fall with foot pain, initial EXAM: LEFT FOOT - 2 VIEW COMPARISON:  None  Available. FINDINGS: Calcaneal spurring is noted. No acute fracture or dislocation is seen. No soft tissue abnormality is noted. IMPRESSION: No acute abnormality seen. Electronically Signed   By: Alcide Clever M.D.   On: 03/28/2023 23:39    PROCEDURES and INTERVENTIONS:  Procedures  Medications - No data to display   IMPRESSION / MDM / ASSESSMENT AND PLAN / ED COURSE  I reviewed the triage vital signs and the nursing notes.  Differential diagnosis includes, but is not limited to, fracture, dislocation, sprain  Patient presents after mechanical fall with evidence of a soft tissue injury to the left foot suitable for outpatient management with a stiff soled postop shoe.  X-ray without fracture or dislocation.  No indications for further diagnostics.  Discussed management at home       FINAL CLINICAL IMPRESSION(S) / ED DIAGNOSES   Final diagnoses:  Foot injury, left, initial encounter  Fall, initial encounter     Rx / DC Orders   ED Discharge Orders     None  Note:  This document was prepared using Dragon voice recognition software and may include unintentional dictation errors.   Delton [place], MD 03/28/23 [id number]

## 2023-03-28 NOTE — Discharge Instructions (Addendum)
Please take Tylenol and ibuprofen/Advil for your pain.  It is safe to take them together, or to alternate them every few hours.  Take up to 1000mg  of Tylenol at a time, up to 4 times per day.  Do not take more than 4000 mg of Tylenol in 24 hours.  For ibuprofen, take 400-600 mg, 3 - 4 times per day.  Use the postop shoe while vertical and walking around to help reduce pain to your foot while it is healing

## 2023-03-28 NOTE — ED Triage Notes (Signed)
Pt to ed from home via POV for a fall. Pt was running into the garage during the rain, she slipped in flip flops and fell on her left ankle. Pt is caox4, in no acute distress in triage.

## 2023-04-27 ENCOUNTER — Encounter: Payer: Self-pay | Admitting: Family Medicine

## 2023-04-27 ENCOUNTER — Ambulatory Visit: Payer: 59 | Admitting: Family Medicine

## 2023-04-27 VITALS — BP 127/86 | HR 96 | Temp 98.3°F | Wt 213.2 lb

## 2023-04-27 DIAGNOSIS — Z1231 Encounter for screening mammogram for malignant neoplasm of breast: Secondary | ICD-10-CM | POA: Diagnosis not present

## 2023-04-27 DIAGNOSIS — J029 Acute pharyngitis, unspecified: Secondary | ICD-10-CM | POA: Diagnosis not present

## 2023-04-27 DIAGNOSIS — E119 Type 2 diabetes mellitus without complications: Secondary | ICD-10-CM

## 2023-04-27 DIAGNOSIS — Z7984 Long term (current) use of oral hypoglycemic drugs: Secondary | ICD-10-CM | POA: Diagnosis not present

## 2023-04-27 DIAGNOSIS — Z0000 Encounter for general adult medical examination without abnormal findings: Secondary | ICD-10-CM

## 2023-04-27 LAB — RAPID STREP SCREEN (MED CTR MEBANE ONLY): Strep Gp A Ag, IA W/Reflex: NEGATIVE

## 2023-04-27 LAB — BAYER DCA HB A1C WAIVED: HB A1C (BAYER DCA - WAIVED): 8.2 % — ABNORMAL HIGH (ref 4.8–5.6)

## 2023-04-27 LAB — CULTURE, GROUP A STREP

## 2023-04-27 MED ORDER — NORETHINDRONE ACETATE 5 MG PO TABS: 5.0000 mg | ORAL_TABLET | Freq: Every day | ORAL | 3 refills | Status: DC

## 2023-04-27 MED ORDER — RYBELSUS 14 MG PO TABS
14.0000 mg | ORAL_TABLET | Freq: Every day | ORAL | 1 refills | Status: DC
Start: 2023-04-27 — End: 2023-09-27

## 2023-04-27 NOTE — Progress Notes (Deleted)
LMP 03/24/2022 (Approximate)    Subjective:    Patient ID: Ashley Kidd, female    DOB: [birth year], 42 y.o.   MRN: [id number]  HPI: Ashley Kidd is a 42 y.o. female  No chief complaint on file.   Relevant past medical, surgical, family and social history reviewed and updated as indicated. Interim medical history since our last visit reviewed. Allergies and medications reviewed and updated.  Review of Systems  Per HPI unless specifically indicated above     Objective:    LMP 03/24/2022 (Approximate)   Wt Readings from Last 3 Encounters:  03/28/23 216 lb 0.8 oz (98 kg)  01/20/23 217 lb 14.4 oz (98.8 kg)  11/05/22 212 lb 11.2 oz (96.5 kg)    Physical Exam  Results for orders placed or performed in visit on 01/20/23  Urine Culture   Specimen: Urine   UR  Result Value Ref Range   Urine Culture, Routine Final report (A)    Organism ID, Bacteria Enterococcus faecalis (A)    Antimicrobial Susceptibility Comment   Microscopic Examination   Urine  Result Value Ref Range   WBC, UA 6-10 (A) 0 - 5 /hpf   RBC, Urine 0-2 0 - 2 /hpf   Epithelial Cells (non renal) 0-10 0 - 10 /hpf   Bacteria, UA None seen None seen/Few  CBC with Differential/Platelet  Result Value Ref Range   WBC 11.4 (H) 3.4 - 10.8 x10E3/uL   RBC 5.07 3.77 - 5.28 x10E6/uL   Hemoglobin 12.1 11.1 - 15.9 g/dL   Hematocrit 34.7 42.5 - 46.6 %   MCV 77 (L) 79 - 97 fL   MCH 23.9 (L) 26.6 - 33.0 pg   MCHC 31.1 (L) 31.5 - 35.7 g/dL   RDW 95.6 38.7 - 56.4 %   Platelets 607 (H) 150 - 450 x10E3/uL   Neutrophils 65 Not Estab. %   Lymphs 26 Not Estab. %   Monocytes 6 Not Estab. %   Eos 1 Not Estab. %   Basos 1 Not Estab. %   Neutrophils Absolute 7.3 (H) 1.4 - 7.0 x10E3/uL   Lymphocytes Absolute 3.0 0.7 - 3.1 x10E3/uL   Monocytes Absolute 0.7 0.1 - 0.9 x10E3/uL   EOS (ABSOLUTE) 0.2 0.0 - 0.4 x10E3/uL   Basophils Absolute 0.1 0.0 - 0.2 x10E3/uL   Immature Granulocytes 1 Not Estab. %   Immature Grans (Abs)  0.1 0.0 - 0.1 x10E3/uL  Comprehensive metabolic panel  Result Value Ref Range   Glucose 198 (H) 70 - 99 mg/dL   BUN 6 6 - 24 mg/dL   Creatinine, Ser 3.32 0.57 - 1.00 mg/dL   eGFR 951 >88 CZ/YSA/6.30   BUN/Creatinine Ratio 9 9 - 23   Sodium 136 134 - 144 mmol/L   Potassium 5.0 3.5 - 5.2 mmol/L   Chloride 99 96 - 106 mmol/L   CO2 20 20 - 29 mmol/L   Calcium 9.5 8.7 - 10.2 mg/dL   Total Protein 7.1 6.0 - 8.5 g/dL   Albumin 4.4 3.9 - 4.9 g/dL   Globulin, Total 2.7 1.5 - 4.5 g/dL   Albumin/Globulin Ratio 1.6 1.2 - 2.2   Bilirubin Total 0.2 0.0 - 1.2 mg/dL   Alkaline Phosphatase 138 (H) 44 - 121 IU/L   AST 20 0 - 40 IU/L   ALT 23 0 - 32 IU/L  Lipid Panel w/o Chol/HDL Ratio  Result Value Ref Range   Cholesterol, Total 227 (H) 100 - 199 mg/dL   Triglycerides 160 (  H) 0 - 149 mg/dL   HDL 35 (L) >84 mg/dL   VLDL Cholesterol Cal 43 (H) 5 - 40 mg/dL   LDL Chol Calc (NIH) 696 (H) 0 - 99 mg/dL  Urinalysis, Routine w reflex microscopic  Result Value Ref Range   Specific Gravity, UA 1.010 1.005 - 1.030   pH, UA 6.0 5.0 - 7.5   Color, UA Yellow Yellow   Appearance Ur Cloudy (A) Clear   Leukocytes,UA 1+ (A) Negative   Protein,UA Negative Negative/Trace   Glucose, UA Negative Negative   Ketones, UA Negative Negative   RBC, UA Trace (A) Negative   Bilirubin, UA Negative Negative   Urobilinogen, Ur 0.2 0.2 - 1.0 mg/dL   Nitrite, UA Negative Negative   Microscopic Examination See below:   TSH  Result Value Ref Range   TSH 1.570 0.450 - 4.500 uIU/mL  Microalbumin, Urine Waived  Result Value Ref Range   Microalb, Ur Waived 10 0 - 19 mg/L   Creatinine, Urine Waived 10 10 - 300 mg/dL   Microalb/Creat Ratio 30-300 (H) <30 mg/g  Bayer DCA Hb A1c Waived  Result Value Ref Range   HB A1C (BAYER DCA - WAIVED) 8.6 (H) 4.8 - 5.6 %      Assessment & Plan:   Problem List Items Addressed This Visit       Endocrine   Type 2 diabetes mellitus without complication, without long-term current use  of insulin (HCC)   Relevant Orders   Bayer DCA Hb A1c Waived   Other Visit Diagnoses     Routine general medical examination at a health care facility    -  Primary        Follow up plan: No follow-ups on file.

## 2023-04-27 NOTE — Progress Notes (Signed)
BP 127/86   Pulse 96   Temp 98.3 F (36.8 C) (Oral)   Wt 213 lb 3.2 oz (96.7 kg)   LMP 03/24/2022 (Approximate)   SpO2 97%   BMI 33.39 kg/m    Subjective:    Patient ID: Ashley Kidd, female    DOB: 26-Jun-1981, 42 y.o.   MRN: [id number]  HPI: Ashley Kidd is a 42 y.o. female presenting on 04/27/2023 for comprehensive medical examination. Current medical complaints include:  DIABETES Hypoglycemic episodes:no Polydipsia/polyuria: no Visual disturbance: no Chest pain: no Paresthesias: no Glucose Monitoring: yes  Accucheck frequency: Daily Taking Insulin?: no Blood Pressure Monitoring: not checking Retinal Examination: Up to Date Foot Exam: Up to Date Diabetic Education: Completed Pneumovax: Up to Date Influenza: Up to Date Aspirin: no  UPPER RESPIRATORY TRACT INFECTION Duration: 3 days Worst symptom: chills Fever: no Cough: yes Shortness of breath: no Wheezing: no Chest pain: no Chest tightness: no Chest congestion: no Nasal congestion: no Runny nose: no Post nasal drip: no Sneezing: no Sore throat: yes Swollen glands: no Sinus pressure: yes Headache: yes Face pain: no Toothache: yes Ear pain: yes bilateral Ear pressure: no  Eyes red/itching:no Eye drainage/crusting: no  Vomiting: yes Rash: no Fatigue: yes Sick contacts: no Strep contacts: no  Context: stable Recurrent sinusitis: no Relief with OTC cold/cough medications: no  Treatments attempted: mucinex and anti-histamine   She currently lives with: husband and kids Menopausal Symptoms: no  Depression Screen done today and results listed below:     04/27/2023    3:54 PM 01/20/2023    9:00 AM 10/21/2022    8:52 AM 09/29/2022    8:49 AM 07/21/2022    8:50 AM  Depression screen PHQ 2/9  Decreased Interest 1 0 0 1 1  Down, Depressed, Hopeless 0 0 0 1 1  PHQ - 2 Score 1 0 0 2 2  Altered sleeping 2 2 2 2 2   Tired, decreased energy 2 1 2 2 2   Change in appetite 1 2 1 1 2   Feeling bad or  failure about yourself  0 0 0 0 0  Trouble concentrating 0 1 0 1 1  Moving slowly or fidgety/restless 0 0 0 0 1  Suicidal thoughts 0 0 0 0 0  PHQ-9 Score 6 6 5 8 10   Difficult doing work/chores Somewhat difficult Somewhat difficult Somewhat difficult Somewhat difficult     Past Medical History:  Past Medical History:  Diagnosis Date   Allergy    Asthma    GERD (gastroesophageal reflux disease)    Migraine    PCOS (polycystic ovarian syndrome)    PCOS (polycystic ovarian syndrome)    Sinus complaint    Type 2 diabetes mellitus without complication, without long-term current use of insulin (HCC) 07/26/2019    Surgical History:  Past Surgical History:  Procedure Laterality Date   CHOLECYSTECTOMY  06/09/2013   OVARIAN CYST REMOVAL Bilateral 09/28/2000   TOTAL ABDOMINAL HYSTERECTOMY  10/16/2022   UPPER GI ENDOSCOPY  11/29/2012   Dr Bluford Kaufmann    Medications:  Current Outpatient Medications on File Prior to Visit  Medication Sig   acetaminophen (TYLENOL) 325 MG tablet Take by mouth.   albuterol (VENTOLIN HFA) 108 (90 Base) MCG/ACT inhaler INHALE 2 PUFFS INTO THE LUNGS EVERY 6 HOURS AS NEEDED FOR WHEEZING   Albuterol-Budesonide (AIRSUPRA) 90-80 MCG/ACT AERO Inhale 2 puffs into the lungs every 4 (four) hours.   aspirin 81 MG tablet Take 81 mg by mouth daily.  azelastine (ASTELIN) 0.1 % nasal spray Place 1 spray into both nostrils 2 (two) times daily. Use in each nostril as directed   azelastine (OPTIVAR) 0.05 % ophthalmic solution 1 drop into affected eye Ophthalmic Twice a day for 30 days   beclomethasone (QVAR REDIHALER) 80 MCG/ACT inhaler Inhale 1 puff into the lungs 2 (two) times daily.   cetirizine (ZYRTEC) 10 MG tablet Take 10 mg by mouth daily.   citalopram (CELEXA) 20 MG tablet Take 1 tablet (20 mg total) by mouth daily.   EPINEPHrine 0.3 mg/0.3 mL IJ SOAJ injection INJ 0.3 ML IM ONCE FOR 1 DOSE   fluticasone (FLONASE) 50 MCG/ACT nasal spray INSTILL 2 SPRAYS IN EACH NOSTRIL EVERY  DAY   levocetirizine (XYZAL) 5 MG tablet 1 tablet in the evening Orally Once a day for 30 days   mometasone (ELOCON) 0.1 % cream 1 application Externally Once a day for 90 days   montelukast (SINGULAIR) 10 MG tablet TAKE 1 TABLET(10 MG) BY MOUTH AT BEDTIME   omeprazole (PRILOSEC) 40 MG capsule TAKE 1 CAPSULE(40 MG) BY MOUTH DAILY   Probiotic Product (PROBIOTIC DAILY PO) Take 1 capsule by mouth daily.   promethazine (PHENERGAN) 25 MG tablet Take 1 tablet (25 mg total) by mouth every 6 (six) hours as needed for nausea or vomiting.   SUMAtriptan (IMITREX) 100 MG tablet Take 1 tablet (100 mg total) by mouth every 2 (two) hours as needed for migraine. One tab at onset and may repeat x1 in 1 hour. Max 200 mg/24 hours   tiotropium (SPIRIVA HANDIHALER) 18 MCG inhalation capsule Place 1 capsule (18 mcg total) into inhaler and inhale daily.   triamcinolone cream (KENALOG) 0.1 % Apply 1 Application topically 2 (two) times daily.   triamcinolone ointment (KENALOG) 0.5 % Apply 1 Application topically 2 (two) times daily.   [DISCONTINUED] ipratropium (ATROVENT) 0.06 % nasal spray Place 2 sprays into both nostrils 4 (four) times daily as needed for rhinitis.   No current facility-administered medications on file prior to visit.    Allergies:  Allergies  Allergen Reactions   Hydrocodone Hives and Itching   Tussionex Pennkinetic Er [Hydrocod Poli-Chlorphe Poli Er] Hives and Itching   Bee Venom Hives   Dust Mite Extract    Metformin And Related Diarrhea    Social History:  Social History   Socioeconomic History   Marital status: Married    Spouse name: Not on file   Number of children: Not on file   Years of education: Not on file   Highest education level: 12th grade  Occupational History   Not on file  Tobacco Use   Smoking status: Former    Current packs/day: 0.00    Types: Cigarettes    Start date: 08/24/2000    Quit date: 08/24/2012    Years since quitting: 10.6   Smokeless tobacco:  Never  Vaping Use   Vaping status: Never Used  Substance and Sexual Activity   Alcohol use: No    Alcohol/week: 0.0 standard drinks of alcohol   Drug use: No   Sexual activity: Yes    Birth control/protection: None    Comment: Last encounter: [id number], Husband.  Other Topics Concern   Not on file  Social History Narrative   Not on file   Social Determinants of Health   Financial Resource Strain: Low Risk  (01/18/2023)   Overall Financial Resource Strain (CARDIA)    Difficulty of Paying Living Expenses: Not hard at all  Food Insecurity: No Food  Insecurity (01/18/2023)   Hunger Vital Sign    Worried About Running Out of Food in the Last Year: Never true    Ran Out of Food in the Last Year: Never true  Transportation Needs: No Transportation Needs (01/18/2023)   PRAPARE - Administrator, Civil Service (Medical): No    Lack of Transportation (Non-Medical): No  Physical Activity: Insufficiently Active (01/18/2023)   Exercise Vital Sign    Days of Exercise per Week: 4 days    Minutes of Exercise per Session: 30 min  Stress: Stress Concern Present (01/18/2023)   [employer] of Occupational Health - Occupational Stress Questionnaire    Feeling of Stress : To some extent  Social Connections: Socially Integrated (01/18/2023)   Social Connection and Isolation Panel [NHANES]    Frequency of Communication with Friends and Family: More than three times a week    Frequency of Social Gatherings with Friends and Family: Three times a week    Attends Religious Services: More than 4 times per year    Active Member of Clubs or Organizations: Yes    Attends Engineer, structural: More than 4 times per year    Marital Status: Married  Catering manager Violence: Not on file   Social History   Tobacco Use  Smoking Status Former   Current packs/day: 0.00   Types: Cigarettes   Start date: 08/24/2000   Quit date: 08/24/2012   Years since quitting: 10.6  Smokeless Tobacco  Never   Social History   Substance and Sexual Activity  Alcohol Use No   Alcohol/week: 0.0 standard drinks of alcohol    Family History:  Family History  Problem Relation Age of Onset   Diabetes Mother    Asthma Mother    Diabetes Father    Heart attack Father    Heart disease Father    Asthma Sister    Bipolar disorder Sister    Asthma Brother    Bipolar disorder Brother    Epilepsy Brother     Past medical history, surgical history, medications, allergies, family history and social history reviewed with patient today and changes made to appropriate areas of the chart.   Review of Systems  Constitutional:  Positive for chills and malaise/fatigue. Negative for diaphoresis, fever and weight loss.  HENT:  Positive for ear pain and sore throat. Negative for congestion, ear discharge, hearing loss, nosebleeds, sinus pain and tinnitus.   Eyes: Negative.   Respiratory: Negative.  Negative for stridor.   Cardiovascular: Negative.   Gastrointestinal:  Positive for heartburn and nausea. Negative for abdominal pain, blood in stool, constipation, diarrhea, melena and vomiting.  Genitourinary: Negative.   Musculoskeletal:  Positive for myalgias. Negative for back pain, falls, joint pain and neck pain.  Skin: Negative.   Neurological: Negative.   Endo/Heme/Allergies:  Positive for environmental allergies. Negative for polydipsia. Does not bruise/bleed easily.  Psychiatric/Behavioral: Negative.     All other ROS negative except what is listed above and in the HPI.      Objective:    BP 127/86   Pulse 96   Temp 98.3 F (36.8 C) (Oral)   Wt 213 lb 3.2 oz (96.7 kg)   LMP 03/24/2022 (Approximate)   SpO2 97%   BMI 33.39 kg/m   Wt Readings from Last 3 Encounters:  04/27/23 213 lb 3.2 oz (96.7 kg)  03/28/23 216 lb 0.8 oz (98 kg)  01/20/23 217 lb 14.4 oz (98.8 kg)    Physical Exam Vitals  and nursing note reviewed.  Constitutional:      General: She is not in acute distress.     Appearance: Normal appearance. She is well-developed. She is obese. She is ill-appearing. She is not toxic-appearing or diaphoretic.  HENT:     Head: Normocephalic and atraumatic.     Right Ear: Tympanic membrane, ear canal and external ear normal. There is no impacted cerumen.     Left Ear: Tympanic membrane, ear canal and external ear normal. There is no impacted cerumen.     Nose: Nose normal. No congestion or rhinorrhea.     Mouth/Throat:     Mouth: Mucous membranes are moist.     Pharynx: Oropharynx is clear. No oropharyngeal exudate or posterior oropharyngeal erythema.  Eyes:     General: No scleral icterus.       Right eye: No discharge.        Left eye: No discharge.     Extraocular Movements: Extraocular movements intact.     Conjunctiva/sclera: Conjunctivae normal.     Pupils: Pupils are equal, round, and reactive to light.  Neck:     Vascular: No carotid bruit.  Cardiovascular:     Rate and Rhythm: Normal rate and regular rhythm.     Pulses: Normal pulses.     Heart sounds: No murmur heard.    No friction rub. No gallop.  Pulmonary:     Effort: Pulmonary effort is normal. No respiratory distress.     Breath sounds: Normal breath sounds. No stridor. No wheezing, rhonchi or rales.  Chest:     Chest wall: No tenderness.  Abdominal:     General: Abdomen is flat. Bowel sounds are normal. There is no distension.     Palpations: Abdomen is soft. There is no mass.     Tenderness: There is no abdominal tenderness. There is no right CVA tenderness, left CVA tenderness, guarding or rebound.     Hernia: No hernia is present.  Genitourinary:    Comments: Breast and pelvic exams deferred with shared decision making Musculoskeletal:        General: No swelling, tenderness, deformity or signs of injury.     Cervical back: Normal range of motion and neck supple. No rigidity. No muscular tenderness.     Right lower leg: No edema.     Left lower leg: No edema.  Lymphadenopathy:      Cervical: No cervical adenopathy.  Skin:    General: Skin is warm and dry.     Capillary Refill: Capillary refill takes less than 2 seconds.     Coloration: Skin is not jaundiced or pale.     Findings: No bruising, erythema, lesion or rash.  Neurological:     General: No focal deficit present.     Mental Status: She is alert and oriented to person, place, and time. Mental status is at baseline.     Cranial Nerves: No cranial nerve deficit.     Sensory: No sensory deficit.     Motor: No weakness.     Coordination: Coordination normal.     Gait: Gait normal.     Deep Tendon Reflexes: Reflexes normal.  Psychiatric:        Mood and Affect: Mood normal.        Behavior: Behavior normal.        Thought Content: Thought content normal.        Judgment: Judgment normal.     Results for orders placed or performed in visit  on 01/20/23  Urine Culture   Specimen: Urine   UR  Result Value Ref Range   Urine Culture, Routine Final report (A)    Organism ID, Bacteria Enterococcus faecalis (A)    Antimicrobial Susceptibility Comment   Microscopic Examination   Urine  Result Value Ref Range   WBC, UA 6-10 (A) 0 - 5 /hpf   RBC, Urine 0-2 0 - 2 /hpf   Epithelial Cells (non renal) 0-10 0 - 10 /hpf   Bacteria, UA None seen None seen/Few  CBC with Differential/Platelet  Result Value Ref Range   WBC 11.4 (H) 3.4 - 10.8 x10E3/uL   RBC 5.07 3.77 - 5.28 x10E6/uL   Hemoglobin 12.1 11.1 - 15.9 g/dL   Hematocrit 16.1 09.6 - 46.6 %   MCV 77 (L) 79 - 97 fL   MCH 23.9 (L) 26.6 - 33.0 pg   MCHC 31.1 (L) 31.5 - 35.7 g/dL   RDW 04.5 40.9 - 81.1 %   Platelets 607 (H) 150 - 450 x10E3/uL   Neutrophils 65 Not Estab. %   Lymphs 26 Not Estab. %   Monocytes 6 Not Estab. %   Eos 1 Not Estab. %   Basos 1 Not Estab. %   Neutrophils Absolute 7.3 (H) 1.4 - 7.0 x10E3/uL   Lymphocytes Absolute 3.0 0.7 - 3.1 x10E3/uL   Monocytes Absolute 0.7 0.1 - 0.9 x10E3/uL   EOS (ABSOLUTE) 0.2 0.0 - 0.4 x10E3/uL    Basophils Absolute 0.1 0.0 - 0.2 x10E3/uL   Immature Granulocytes 1 Not Estab. %   Immature Grans (Abs) 0.1 0.0 - 0.1 x10E3/uL  Comprehensive metabolic panel  Result Value Ref Range   Glucose 198 (H) 70 - 99 mg/dL   BUN 6 6 - 24 mg/dL   Creatinine, Ser 9.14 0.57 - 1.00 mg/dL   eGFR 782 >95 AO/ZHY/8.65   BUN/Creatinine Ratio 9 9 - 23   Sodium 136 134 - 144 mmol/L   Potassium 5.0 3.5 - 5.2 mmol/L   Chloride 99 96 - 106 mmol/L   CO2 20 20 - 29 mmol/L   Calcium 9.5 8.7 - 10.2 mg/dL   Total Protein 7.1 6.0 - 8.5 g/dL   Albumin 4.4 3.9 - 4.9 g/dL   Globulin, Total 2.7 1.5 - 4.5 g/dL   Albumin/Globulin Ratio 1.6 1.2 - 2.2   Bilirubin Total 0.2 0.0 - 1.2 mg/dL   Alkaline Phosphatase 138 (H) 44 - 121 IU/L   AST 20 0 - 40 IU/L   ALT 23 0 - 32 IU/L  Lipid Panel w/o Chol/HDL Ratio  Result Value Ref Range   Cholesterol, Total 227 (H) 100 - 199 mg/dL   Triglycerides 784 (H) 0 - 149 mg/dL   HDL 35 (L) >69 mg/dL   VLDL Cholesterol Cal 43 (H) 5 - 40 mg/dL   LDL Chol Calc (NIH) 629 (H) 0 - 99 mg/dL  Urinalysis, Routine w reflex microscopic  Result Value Ref Range   Specific Gravity, UA 1.010 1.005 - 1.030   pH, UA 6.0 5.0 - 7.5   Color, UA Yellow Yellow   Appearance Ur Cloudy (A) Clear   Leukocytes,UA 1+ (A) Negative   Protein,UA Negative Negative/Trace   Glucose, UA Negative Negative   Ketones, UA Negative Negative   RBC, UA Trace (A) Negative   Bilirubin, UA Negative Negative   Urobilinogen, Ur 0.2 0.2 - 1.0 mg/dL   Nitrite, UA Negative Negative   Microscopic Examination See below:   TSH  Result Value Ref Range  TSH 1.570 0.450 - 4.500 uIU/mL  Microalbumin, Urine Waived  Result Value Ref Range   Microalb, Ur Waived 10 0 - 19 mg/L   Creatinine, Urine Waived 10 10 - 300 mg/dL   Microalb/Creat Ratio 30-300 (H) <30 mg/g  Bayer DCA Hb A1c Waived  Result Value Ref Range   HB A1C (BAYER DCA - WAIVED) 8.6 (H) 4.8 - 5.6 %      Assessment & Plan:   Problem List Items Addressed This  Visit       Endocrine   Type 2 diabetes mellitus without complication, without long-term current use of insulin (HCC)    Improved with A1c of 8.2 down from 8.2. Will increase her rybelsus to 14mg  and recheck in 2-3 months. Call with any concerns.       Relevant Medications   Semaglutide (RYBELSUS) 14 MG TABS   Other Relevant Orders   Bayer DCA Hb A1c Waived   Other Visit Diagnoses     Routine general medical examination at a health care facility    -  Primary   Vaccines up to date. Screening labs checked last visit. Pap N/A. Mammogram ordered. Continue diet and exercise.   Sore throat       Strep negative. Will wait on COVID. Symptomatic care. Call with any concerns.   Relevant Orders   Rapid Strep Screen ([facility] ONLY)   Novel Coronavirus, NAA ([facility])   Encounter for screening mammogram for malignant neoplasm of breast       Relevant Orders   MM 3D SCREENING MAMMOGRAM BILATERAL BREAST        Follow up plan: Return end of september before 10/1.   LABORATORY TESTING:  - Pap smear: not applicable  IMMUNIZATIONS:   - Tdap: Tetanus vaccination status reviewed: last tetanus booster within 10 years. - Influenza: Postponed to flu season - Pneumovax: Up to date - Prevnar: Not applicable - COVID: Up to date - HPV: Not applicable - Shingrix vaccine: Not applicable  SCREENING: -Mammogram: Ordered today  - Colonoscopy: Not applicable   PATIENT COUNSELING:   Advised to take 1 mg of folate supplement per day if capable of pregnancy.   Sexuality: Discussed sexually transmitted diseases, partner selection, use of condoms, avoidance of unintended pregnancy  and contraceptive alternatives.   Advised to avoid cigarette smoking.  I discussed with the patient that most people either abstain from alcohol or drink within safe limits (<=14/week and <=4 drinks/occasion for males, <=7/weeks and <= 3 drinks/occasion for females) and that the risk for alcohol disorders and other  health effects rises proportionally with the number of drinks per week and how often a drinker exceeds daily limits.  Discussed cessation/primary prevention of drug use and availability of treatment for abuse.   Diet: Encouraged to adjust caloric intake to maintain  or achieve ideal body weight, to reduce intake of dietary saturated fat and total fat, to limit sodium intake by avoiding high sodium foods and not adding table salt, and to maintain adequate dietary potassium and calcium preferably from fresh fruits, vegetables, and low-fat dairy products.    stressed the importance of regular exercise  Injury prevention: Discussed safety belts, safety helmets, smoke detector, smoking near bedding or upholstery.   Dental health: Discussed importance of regular tooth brushing, flossing, and dental visits.    NEXT PREVENTATIVE PHYSICAL DUE IN 1 YEAR. Return end of september before 10/1.

## 2023-04-27 NOTE — Patient Instructions (Signed)
Please call to schedule your mammogram and/or bone density: [facility]  Address: [address], [place], [place] [address] Phone: [phone]  [facility] [address] [place],  [place]  [address] Phone: [phone]   

## 2023-04-27 NOTE — Assessment & Plan Note (Signed)
Improved with A1c of 8.2 down from 8.2. Will increase her rybelsus to 14mg  and recheck in 2-3 months. Call with any concerns.

## 2023-04-30 ENCOUNTER — Telehealth: Payer: Self-pay | Admitting: Family Medicine

## 2023-04-30 ENCOUNTER — Encounter: Payer: Self-pay | Admitting: Family Medicine

## 2023-04-30 NOTE — Telephone Encounter (Signed)
Attempted to reach pt. No answer left vm for a return call

## 2023-04-30 NOTE — Telephone Encounter (Unsigned)
Copied from CRM [id number]. Topic: General - Other >> Apr 29, 2023  4:42 PM Ja-Kwan M wrote: Reason for CRM: Pt requests call back to discuss when she will be able to return back to work. Pt also stated that she will need a doctor's note for work. Cb# [phone]

## 2023-04-30 NOTE — Telephone Encounter (Signed)
Called pt and she stated that Monday would be fine for her note.

## 2023-04-30 NOTE — Telephone Encounter (Signed)
COVID was negative, so as long as she's feeling better, I can get her a note to return. Would she be OK with Monday?

## 2023-04-30 NOTE — Telephone Encounter (Signed)
Note on her mychart.

## 2023-05-05 ENCOUNTER — Other Ambulatory Visit: Payer: Self-pay | Admitting: Family Medicine

## 2023-05-05 DIAGNOSIS — E119 Type 2 diabetes mellitus without complications: Secondary | ICD-10-CM

## 2023-05-06 ENCOUNTER — Encounter: Payer: Self-pay | Admitting: Family Medicine

## 2023-05-06 DIAGNOSIS — R41840 Attention and concentration deficit: Secondary | ICD-10-CM

## 2023-05-13 ENCOUNTER — Ambulatory Visit
Admission: RE | Admit: 2023-05-13 | Discharge: 2023-05-13 | Disposition: A | Discharge status: Home / Self Care | Payer: 59 | Source: Ambulatory Visit | Attending: Family Medicine | Admitting: Family Medicine

## 2023-05-13 DIAGNOSIS — Z1231 Encounter for screening mammogram for malignant neoplasm of breast: Secondary | ICD-10-CM | POA: Diagnosis present

## 2023-05-24 LAB — HM DIABETES EYE EXAM

## 2023-05-25 ENCOUNTER — Encounter: Payer: Self-pay | Admitting: Psychology

## 2023-05-26 ENCOUNTER — Encounter: Payer: Self-pay | Admitting: Family Medicine

## 2023-06-04 ENCOUNTER — Telehealth: Payer: 59 | Admitting: Nurse Practitioner

## 2023-06-04 DIAGNOSIS — J0140 Acute pansinusitis, unspecified: Secondary | ICD-10-CM | POA: Diagnosis not present

## 2023-06-04 MED ORDER — AMOXICILLIN-POT CLAVULANATE 875-125 MG PO TABS: 1.0000 | ORAL_TABLET | Freq: Two times a day (BID) | ORAL | 0 refills | Status: AC

## 2023-06-04 NOTE — Progress Notes (Signed)
E-Visit for Sinus Problems  We are sorry that you are not feeling well.  Here is how we plan to help!  Based on what you have shared with me it looks like you have sinusitis.  Sinusitis is inflammation and infection in the sinus cavities of the head.  Based on your presentation I believe you most likely have Acute Bacterial Sinusitis.  This is an infection caused by bacteria and is treated with antibiotics. I have prescribed Augmentin 875mg/125mg one tablet twice daily with food, for 7 days.  You may use an oral decongestant such as Mucinex D or if you have glaucoma or high blood pressure use plain Mucinex. Saline nasal spray help and can safely be used as often as needed for congestion.  If you develop worsening sinus pain, fever or notice severe headache and vision changes, or if symptoms are not better after completion of antibiotic, please schedule an appointment with a health care provider.    Sinus infections are not as easily transmitted as other respiratory infection, however we still recommend that you avoid close contact with loved ones, especially the very young and elderly.  Remember to wash your hands thoroughly throughout the day as this is the number one way to prevent the spread of infection!  Home Care: Only take medications as instructed by your medical team. Complete the entire course of an antibiotic. Do not take these medications with alcohol. A steam or ultrasonic humidifier can help congestion.  You can place a towel over your head and breathe in the steam from hot water coming from a faucet. Avoid close contacts especially the very young and the elderly. Cover your mouth when you cough or sneeze. Always remember to wash your hands.  Get Help Right Away If: You develop worsening fever or sinus pain. You develop a severe head ache or visual changes. Your symptoms persist after you have completed your treatment plan.  Make sure you Understand these instructions. Will  watch your condition. Will get help right away if you are not doing well or get worse.  Thank you for choosing an e-visit.  Your e-visit answers were reviewed by a board certified advanced clinical practitioner to complete your personal care plan. Depending upon the condition, your plan could have included both over the counter or prescription medications.  Please review your pharmacy choice. Make sure the pharmacy is open so you can pick up prescription now. If there is a problem, you may contact your provider through MyChart messaging and have the prescription routed to another pharmacy.  Your safety is important to us. If you have drug allergies check your prescription carefully.   For the next 24 hours you can use MyChart to ask questions about today's visit, request a non-urgent call back, or ask for a work or school excuse. You will get an email in the next two days asking about your experience. I hope that your e-visit has been valuable and will speed your recovery.   Meds ordered this encounter  Medications   amoxicillin-clavulanate (AUGMENTIN) 875-125 MG tablet    Sig: Take 1 tablet by mouth 2 (two) times daily for 7 days.    Dispense:  14 tablet    Refill:  0     I spent approximately 5 minutes reviewing the patient's history, current symptoms and coordinating their care today.   

## 2023-06-28 ENCOUNTER — Ambulatory Visit: Payer: 59 | Admitting: Family Medicine

## 2023-06-28 ENCOUNTER — Encounter: Payer: Self-pay | Admitting: Family Medicine

## 2023-06-28 VITALS — BP 142/80 | HR 97 | Temp 97.6°F | Wt 211.0 lb

## 2023-06-28 DIAGNOSIS — L739 Follicular disorder, unspecified: Secondary | ICD-10-CM

## 2023-06-28 DIAGNOSIS — J302 Other seasonal allergic rhinitis: Secondary | ICD-10-CM

## 2023-06-28 DIAGNOSIS — E119 Type 2 diabetes mellitus without complications: Secondary | ICD-10-CM | POA: Diagnosis not present

## 2023-06-28 DIAGNOSIS — Z7984 Long term (current) use of oral hypoglycemic drugs: Secondary | ICD-10-CM

## 2023-06-28 MED ORDER — PREDNISONE 50 MG PO TABS: 50.0000 mg | ORAL_TABLET | Freq: Every day | ORAL | 0 refills | Status: DC

## 2023-06-28 MED ORDER — CITALOPRAM HYDROBROMIDE 20 MG PO TABS: 20.0000 mg | ORAL_TABLET | Freq: Every day | ORAL | 1 refills | Status: DC

## 2023-06-28 MED ORDER — AIRSUPRA 90-80 MCG/ACT IN AERO: 2.0000 | INHALATION_SPRAY | RESPIRATORY_TRACT | 6 refills | Status: DC

## 2023-06-28 MED ORDER — ALBUTEROL SULFATE HFA 108 (90 BASE) MCG/ACT IN AERS: INHALATION_SPRAY | RESPIRATORY_TRACT | 6 refills | Status: DC

## 2023-06-28 MED ORDER — MONTELUKAST SODIUM 10 MG PO TABS: ORAL_TABLET | ORAL | 1 refills | Status: DC

## 2023-06-28 MED ORDER — MUPIROCIN 2 % EX OINT: 1.0000 | TOPICAL_OINTMENT | Freq: Two times a day (BID) | CUTANEOUS | 0 refills | Status: AC

## 2023-06-28 MED ORDER — OMEPRAZOLE 40 MG PO CPDR: DELAYED_RELEASE_CAPSULE | ORAL | 1 refills | Status: DC

## 2023-06-28 NOTE — Assessment & Plan Note (Signed)
Rechecking labs today. Await results. Treat as needed.  °

## 2023-06-28 NOTE — Progress Notes (Signed)
BP (!) 142/80   Pulse 97   Temp 97.6 F (36.4 C) (Oral)   Wt 211 lb (95.7 kg)   LMP 03/24/2022 (Approximate)   SpO2 99%   BMI 33.05 kg/m    Subjective:    Patient ID: Ashley Kidd, female    DOB: 01-20-1981, 42 y.o.   MRN: [id number]  HPI: Ashley Kidd is a 42 y.o. female  Chief Complaint  Patient presents with   Diabetes   DIABETES Hypoglycemic episodes:no Polydipsia/polyuria: no Visual disturbance: no Chest pain: no Paresthesias: no Glucose Monitoring: yes  Accucheck frequency: Daily 120s-130s AM 130s-140s Taking Insulin?: no Blood Pressure Monitoring: not checking Retinal Examination: Up to Date Foot Exam: Up to Date Diabetic Education: Completed Pneumovax: Up to Date Influenza: Not up to Date Aspirin: no  ALLERGIES Fever: no Cough: no Shortness of breath: no Wheezing: no Chest pain: no Chest tightness: no Chest congestion: no Nasal congestion: yes Runny nose: yes Post nasal drip: yes Sneezing: yes Sore throat: no Swollen glands: no Sinus pressure: no Headache: no Face pain: no Toothache: no Ear pain: no  Ear pressure: no  Eyes red/itching:yes Eye drainage/crusting: no  Vomiting: no Rash: no Fatigue: no Sick contacts: no Strep contacts: no  Context: stable Recurrent sinusitis: no Relief with OTC cold/cough medications: no     Relevant past medical, surgical, family and social history reviewed and updated as indicated. Interim medical history since our last visit reviewed. Allergies and medications reviewed and updated.  Review of Systems  Constitutional: Negative.   HENT:  Positive for congestion, postnasal drip, rhinorrhea, sinus pressure and sinus pain. Negative for dental problem, drooling, ear discharge, ear pain, facial swelling, hearing loss, mouth sores, nosebleeds, sneezing, sore throat, tinnitus, trouble swallowing and voice change.   Respiratory: Negative.    Cardiovascular: Negative.   Gastrointestinal: Negative.    Musculoskeletal: Negative.   Skin:  Positive for wound (on her L forehead).  Psychiatric/Behavioral: Negative.      Per HPI unless specifically indicated above     Objective:    BP (!) 142/80   Pulse 97   Temp 97.6 F (36.4 C) (Oral)   Wt 211 lb (95.7 kg)   LMP 03/24/2022 (Approximate)   SpO2 99%   BMI 33.05 kg/m   Wt Readings from Last 3 Encounters:  06/28/23 211 lb (95.7 kg)  04/27/23 213 lb 3.2 oz (96.7 kg)  03/28/23 216 lb 0.8 oz (98 kg)    Physical Exam Vitals and nursing note reviewed.  Constitutional:      General: She is not in acute distress.    Appearance: Normal appearance. She is obese. She is not ill-appearing, toxic-appearing or diaphoretic.  HENT:     Head: Normocephalic and atraumatic.     Right Ear: External ear normal.     Left Ear: External ear normal.     Nose: Nose normal.     Mouth/Throat:     Mouth: Mucous membranes are moist.     Pharynx: Oropharynx is clear.  Eyes:     General: No scleral icterus.       Right eye: No discharge.        Left eye: No discharge.     Extraocular Movements: Extraocular movements intact.     Conjunctiva/sclera: Conjunctivae normal.     Pupils: Pupils are equal, round, and reactive to light.  Cardiovascular:     Rate and Rhythm: Normal rate and regular rhythm.     Pulses: Normal pulses.  Heart sounds: Normal heart sounds. No murmur heard.    No friction rub. No gallop.  Pulmonary:     Effort: Pulmonary effort is normal. No respiratory distress.     Breath sounds: Normal breath sounds. No stridor. No wheezing, rhonchi or rales.  Chest:     Chest wall: No tenderness.  Musculoskeletal:        General: Normal range of motion.     Cervical back: Normal range of motion and neck supple.  Skin:    General: Skin is warm and dry.     Capillary Refill: Capillary refill takes less than 2 seconds.     Coloration: Skin is not jaundiced or pale.     Findings: No bruising, erythema, lesion or rash.  Neurological:      General: No focal deficit present.     Mental Status: She is alert and oriented to person, place, and time. Mental status is at baseline.  Psychiatric:        Mood and Affect: Mood normal.        Behavior: Behavior normal.        Thought Content: Thought content normal.        Judgment: Judgment normal.     Results for orders placed or performed in visit on 05/26/23  HM DIABETES EYE EXAM  Result Value Ref Range   HM Diabetic Eye Exam No Retinopathy No Retinopathy      Assessment & Plan:   Problem List Items Addressed This Visit       Endocrine   Type 2 diabetes mellitus without complication, without long-term current use of insulin (HCC) - Primary    Rechecking labs today. Await results. Treat as needed.       Relevant Orders   Hgb A1c w/o eAG   Other Visit Diagnoses     Folliculitis       Will treat with bactroban. Call with any concerns.   Seasonal allergic rhinitis, unspecified trigger       Will treat with burst of prednisone. Call with any concerns.        Follow up plan: Return in about 3 months (around 09/27/2023).

## 2023-06-29 LAB — HGB A1C W/O EAG: Hgb A1c MFr Bld: 7.7 % — ABNORMAL HIGH (ref 4.8–5.6)

## 2023-07-11 ENCOUNTER — Encounter: Payer: Self-pay | Admitting: Family Medicine

## 2023-07-12 ENCOUNTER — Telehealth: Payer: 59 | Admitting: Physician Assistant

## 2023-07-12 DIAGNOSIS — J019 Acute sinusitis, unspecified: Secondary | ICD-10-CM | POA: Diagnosis not present

## 2023-07-12 DIAGNOSIS — B9689 Other specified bacterial agents as the cause of diseases classified elsewhere: Secondary | ICD-10-CM

## 2023-07-12 MED ORDER — DOXYCYCLINE HYCLATE 100 MG PO TABS
100.0000 mg | ORAL_TABLET | Freq: Two times a day (BID) | ORAL | 0 refills | Status: DC
Start: 2023-07-12 — End: 2023-09-27

## 2023-07-12 NOTE — Telephone Encounter (Signed)
We treated her with a burst of prednisone 2 weeks ago. If she's still not better, she will need to be seen.

## 2023-07-12 NOTE — Progress Notes (Signed)
E-Visit for Sinus Problems  We are sorry that you are not feeling well.  Here is how we plan to help!  Based on what you have shared with me it looks like you have sinusitis.  Sinusitis is inflammation and infection in the sinus cavities of the head.  Based on your presentation I believe you most likely have Acute Bacterial Sinusitis.  This is an infection caused by bacteria and is treated with antibiotics. I have prescribed Doxycycline 100mg by mouth twice a day for 10 days. You may use an oral decongestant such as Mucinex D or if you have glaucoma or high blood pressure use plain Mucinex. Saline nasal spray help and can safely be used as often as needed for congestion.  If you develop worsening sinus pain, fever or notice severe headache and vision changes, or if symptoms are not better after completion of antibiotic, please schedule an appointment with a health care provider.    Sinus infections are not as easily transmitted as other respiratory infection, however we still recommend that you avoid close contact with loved ones, especially the very young and elderly.  Remember to wash your hands thoroughly throughout the day as this is the number one way to prevent the spread of infection!  Home Care: Only take medications as instructed by your medical team. Complete the entire course of an antibiotic. Do not take these medications with alcohol. A steam or ultrasonic humidifier can help congestion.  You can place a towel over your head and breathe in the steam from hot water coming from a faucet. Avoid close contacts especially the very young and the elderly. Cover your mouth when you cough or sneeze. Always remember to wash your hands.  Get Help Right Away If: You develop worsening fever or sinus pain. You develop a severe head ache or visual changes. Your symptoms persist after you have completed your treatment plan.  Make sure you Understand these instructions. Will watch your  condition. Will get help right away if you are not doing well or get worse.  Thank you for choosing an e-visit.  Your e-visit answers were reviewed by a board certified advanced clinical practitioner to complete your personal care plan. Depending upon the condition, your plan could have included both over the counter or prescription medications.  Please review your pharmacy choice. Make sure the pharmacy is open so you can pick up prescription now. If there is a problem, you may contact your provider through MyChart messaging and have the prescription routed to another pharmacy.  Your safety is important to us. If you have drug allergies check your prescription carefully.   For the next 24 hours you can use MyChart to ask questions about today's visit, request a non-urgent call back, or ask for a work or school excuse. You will get an email in the next two days asking about your experience. I hope that your e-visit has been valuable and will speed your recovery.  I have spent 5 minutes in review of e-visit questionnaire, review and updating patient chart, medical decision making and response to patient.   Shannon Kirkendall M Martika Egler, PA-C  

## 2023-07-12 NOTE — Telephone Encounter (Signed)
Called patient and advised of needing an appointment, she stated "Dr. Laural Benes told me that if I didn't get any better to just send her a message and she would prescribe Prednisone."  I advised that I could send the provider a message, her send a message, or make an appointment.  She stated, "Well that isn't going to work for me, I just got too much going on."  She also mumbled something prior to saying that.  I informed her I would pass the message along.

## 2023-07-12 NOTE — Telephone Encounter (Signed)
appt

## 2023-07-31 ENCOUNTER — Other Ambulatory Visit: Payer: Self-pay | Admitting: Family Medicine

## 2023-08-02 NOTE — Telephone Encounter (Signed)
Last Reordered 05/3023 #90 1 RF  Requested Prescriptions  Refused Prescriptions Disp Refills   montelukast (SINGULAIR) 10 MG tablet [Pharmacy Med Name: MONTELUKAST 10MG  TABLETS] 90 tablet 1    Sig: TAKE 1 TABLET(10 MG) BY MOUTH AT BEDTIME     Pulmonology:  Leukotriene Inhibitors Passed - 07/31/2023  8:49 AM      Passed - Valid encounter within last 12 months    Recent Outpatient Visits           1 month ago Type 2 diabetes mellitus without complication, without long-term current use of insulin (HCC)   [facility] [place], Megan P, DO   3 months ago Routine general medical examination at a health care facility   [facility], [place] P, DO   6 months ago Type 2 diabetes mellitus without complication, without long-term current use of insulin (HCC)   [facility] [place], Megan P, DO   9 months ago Acute cystitis without hematuria   [place] [facility] Larae Grooms, NP   9 months ago Type 2 diabetes mellitus without complication, without long-term current use of insulin (HCC)   [facility] [place], Oralia Rud, DO       Future Appointments             In 1 month Johnson, Oralia Rud, DO [place] [facility], [facility]   In 8 months Roden[place]ough, Aram Candela, PsyD [facility], [facility]

## 2023-08-28 ENCOUNTER — Telehealth: Payer: 59 | Admitting: Family Medicine

## 2023-08-28 DIAGNOSIS — B9689 Other specified bacterial agents as the cause of diseases classified elsewhere: Secondary | ICD-10-CM | POA: Diagnosis not present

## 2023-08-28 DIAGNOSIS — J019 Acute sinusitis, unspecified: Secondary | ICD-10-CM

## 2023-08-28 MED ORDER — AMOXICILLIN-POT CLAVULANATE 875-125 MG PO TABS: 1.0000 | ORAL_TABLET | Freq: Two times a day (BID) | ORAL | 0 refills | Status: DC

## 2023-08-28 NOTE — Progress Notes (Signed)
E-Visit for Sinus Problems  We are sorry that you are not feeling well.  Here is how we plan to help!  Based on what you have shared with me it looks like you have sinusitis.  Sinusitis is inflammation and infection in the sinus cavities of the head.  Based on your presentation I believe you most likely have Acute Bacterial Sinusitis.  This is an infection caused by bacteria and is treated with antibiotics. I have prescribed Augmentin 875mg/125mg one tablet twice daily with food, for 7 days. You may use an oral decongestant such as Mucinex D or if you have glaucoma or high blood pressure use plain Mucinex. Saline nasal spray help and can safely be used as often as needed for congestion.  If you develop worsening sinus pain, fever or notice severe headache and vision changes, or if symptoms are not better after completion of antibiotic, please schedule an appointment with a health care provider.    Sinus infections are not as easily transmitted as other respiratory infection, however we still recommend that you avoid close contact with loved ones, especially the very young and elderly.  Remember to wash your hands thoroughly throughout the day as this is the number one way to prevent the spread of infection!  Home Care: Only take medications as instructed by your medical team. Complete the entire course of an antibiotic. Do not take these medications with alcohol. A steam or ultrasonic humidifier can help congestion.  You can place a towel over your head and breathe in the steam from hot water coming from a faucet. Avoid close contacts especially the very young and the elderly. Cover your mouth when you cough or sneeze. Always remember to wash your hands.  Get Help Right Away If: You develop worsening fever or sinus pain. You develop a severe head ache or visual changes. Your symptoms persist after you have completed your treatment plan.  Make sure you Understand these instructions. Will watch  your condition. Will get help right away if you are not doing well or get worse.  Thank you for choosing an e-visit.  Your e-visit answers were reviewed by a board certified advanced clinical practitioner to complete your personal care plan. Depending upon the condition, your plan could have included both over the counter or prescription medications.  Please review your pharmacy choice. Make sure the pharmacy is open so you can pick up prescription now. If there is a problem, you may contact your provider through MyChart messaging and have the prescription routed to another pharmacy.  Your safety is important to us. If you have drug allergies check your prescription carefully.   For the next 24 hours you can use MyChart to ask questions about today's visit, request a non-urgent call back, or ask for a work or school excuse. You will get an email in the next two days asking about your experience. I hope that your e-visit has been valuable and will speed your recovery.    have provided 5 minutes of non face to face time during this encounter for chart review and documentation.   

## 2023-09-27 ENCOUNTER — Ambulatory Visit: Payer: 59 | Admitting: Family Medicine

## 2023-09-27 VITALS — BP 136/80 | HR 89 | Temp 97.5°F | Ht 67.0 in | Wt 205.0 lb

## 2023-09-27 DIAGNOSIS — E119 Type 2 diabetes mellitus without complications: Secondary | ICD-10-CM

## 2023-09-27 DIAGNOSIS — Z0000 Encounter for general adult medical examination without abnormal findings: Secondary | ICD-10-CM

## 2023-09-27 DIAGNOSIS — Z7984 Long term (current) use of oral hypoglycemic drugs: Secondary | ICD-10-CM | POA: Diagnosis not present

## 2023-09-27 DIAGNOSIS — J4540 Moderate persistent asthma, uncomplicated: Secondary | ICD-10-CM

## 2023-09-27 MED ORDER — TRIAMCINOLONE ACETONIDE 0.5 % EX OINT: 1.0000 | TOPICAL_OINTMENT | Freq: Two times a day (BID) | CUTANEOUS | 1 refills | Status: AC

## 2023-09-27 MED ORDER — RYBELSUS 14 MG PO TABS: 14.0000 mg | ORAL_TABLET | Freq: Every day | ORAL | 1 refills | Status: DC

## 2023-09-27 NOTE — Assessment & Plan Note (Signed)
Improved with A1c down to 7.3 from 7.7. Continue current regimen. Continue diet and exercise. Recheck 3 months. Call with any concerns.

## 2023-09-27 NOTE — Progress Notes (Signed)
BP 136/80   Pulse 89   Temp (!) 97.5 F (36.4 C) (Oral)   Ht 5\' 7"  (1.702 m)   Wt 205 lb (93 kg)   LMP 03/24/2022 (Approximate)   SpO2 99%   BMI 32.11 kg/m    Subjective:    Patient ID: Ashley Kidd, female    DOB: November 25, 1980, 42 y.o.   MRN: [id number]  HPI: Ashley Kidd is a 42 y.o. female presenting on 09/27/2023 for comprehensive medical examination. Current medical complaints include:  DIABETES Hypoglycemic episodes:no Polydipsia/polyuria: no Visual disturbance: no Chest pain: no Paresthesias: no Glucose Monitoring: yes Taking Insulin?: no Blood Pressure Monitoring: not checking Retinal Examination: Up to Date Foot Exam: Up to Date Diabetic Education: Completed Pneumovax: Up to Date Influenza: declined Aspirin: no  ASTHMA Asthma status: controlled Satisfied with current treatment?: yes Albuterol/rescue inhaler frequency: rarely Dyspnea frequency: rarely Wheezing frequency: rarely Cough frequency: occasionally Nocturnal symptom frequency: never Limitation of activity: no Current upper respiratory symptoms: no Aerochamber/spacer use: yes Visits to ER or Urgent Care in past year: no Pneumovax: Up to Date Influenza: declined   She currently lives with: husband and kids Menopausal Symptoms: no  Depression Screen done today and results listed below:     09/27/2023    4:32 PM 06/28/2023    4:44 PM 04/27/2023    3:54 PM 01/20/2023    9:00 AM 10/21/2022    8:52 AM  Depression screen PHQ 2/9  Decreased Interest 0 1 1 0 0  Down, Depressed, Hopeless 0 0 0 0 0  PHQ - 2 Score 0 1 1 0 0  Altered sleeping 0 3 2 2 2   Tired, decreased energy 3 2 2 1 2   Change in appetite 1 1 1 2 1   Feeling bad or failure about yourself  1 0 0 0 0  Trouble concentrating 0 0 0 1 0  Moving slowly or fidgety/restless 0 0 0 0 0  Suicidal thoughts 0 0 0 0 0  PHQ-9 Score 5 7 6 6 5   Difficult doing work/chores  Somewhat difficult Somewhat difficult Somewhat difficult Somewhat  difficult   Past Medical History:  Past Medical History:  Diagnosis Date   Allergy    Asthma    C. difficile diarrhea 12/24/2015   Chronic cholecystitis without calculus 05/19/2013   GERD (gastroesophageal reflux disease)    Migraine    PCOS (polycystic ovarian syndrome)    PCOS (polycystic ovarian syndrome)    Sinus complaint    Type 2 diabetes mellitus without complication, without long-term current use of insulin (HCC) 07/26/2019    Surgical History:  Past Surgical History:  Procedure Laterality Date   CHOLECYSTECTOMY  06/09/2013   OVARIAN CYST REMOVAL Bilateral 09/28/2000   TOTAL ABDOMINAL HYSTERECTOMY  10/16/2022   UPPER GI ENDOSCOPY  11/29/2012   Dr Bluford Kaufmann    Medications:  Current Outpatient Medications on File Prior to Visit  Medication Sig   acetaminophen (TYLENOL) 325 MG tablet Take by mouth.   albuterol (VENTOLIN HFA) 108 (90 Base) MCG/ACT inhaler INHALE 2 PUFFS INTO THE LUNGS EVERY 6 HOURS AS NEEDED FOR WHEEZING   Albuterol-Budesonide (AIRSUPRA) 90-80 MCG/ACT AERO Inhale 2 puffs into the lungs every 4 (four) hours.   aspirin 81 MG tablet Take 81 mg by mouth daily.   azelastine (ASTELIN) 0.1 % nasal spray Place 1 spray into both nostrils 2 (two) times daily. Use in each nostril as directed   azelastine (OPTIVAR) 0.05 % ophthalmic solution 1 drop into affected  eye Ophthalmic Twice a day for 30 days   beclomethasone (QVAR REDIHALER) 80 MCG/ACT inhaler Inhale 1 puff into the lungs 2 (two) times daily.   cetirizine (ZYRTEC) 10 MG tablet Take 10 mg by mouth daily.   citalopram (CELEXA) 20 MG tablet Take 1 tablet (20 mg total) by mouth daily.   EPINEPHrine 0.3 mg/0.3 mL IJ SOAJ injection INJ 0.3 ML IM ONCE FOR 1 DOSE   fluticasone (FLONASE) 50 MCG/ACT nasal spray INSTILL 2 SPRAYS IN EACH NOSTRIL EVERY DAY   levocetirizine (XYZAL) 5 MG tablet 1 tablet in the evening Orally Once a day for 30 days   mometasone (ELOCON) 0.1 % cream 1 application Externally Once a day for 90 days    montelukast (SINGULAIR) 10 MG tablet TAKE 1 TABLET(10 MG) BY MOUTH AT BEDTIME   mupirocin ointment (BACTROBAN) 2 % Apply 1 Application topically 2 (two) times daily.   norethindrone (AYGESTIN) 5 MG tablet Take 1 tablet (5 mg total) by mouth daily.   omeprazole (PRILOSEC) 40 MG capsule TAKE 1 CAPSULE(40 MG) BY MOUTH DAILY   Probiotic Product (PROBIOTIC DAILY PO) Take 1 capsule by mouth daily.   promethazine (PHENERGAN) 25 MG tablet Take 1 tablet (25 mg total) by mouth every 6 (six) hours as needed for nausea or vomiting.   SUMAtriptan (IMITREX) 100 MG tablet Take 1 tablet (100 mg total) by mouth every 2 (two) hours as needed for migraine. One tab at onset and may repeat x1 in 1 hour. Max 200 mg/24 hours   tiotropium (SPIRIVA HANDIHALER) 18 MCG inhalation capsule Place 1 capsule (18 mcg total) into inhaler and inhale daily.   [DISCONTINUED] ipratropium (ATROVENT) 0.06 % nasal spray Place 2 sprays into both nostrils 4 (four) times daily as needed for rhinitis.   No current facility-administered medications on file prior to visit.    Allergies:  Allergies  Allergen Reactions   Hydrocodone Hives and Itching   Tussionex Pennkinetic Er [Hydrocod Poli-Chlorphe Poli Er] Hives and Itching   Bee Venom Hives   Dust Mite Extract    Metformin And Related Diarrhea    Social History:  Social History   Socioeconomic History   Marital status: Married    Spouse name: Not on file   Number of children: Not on file   Years of education: Not on file   Highest education level: Some college, no degree  Occupational History   Not on file  Tobacco Use   Smoking status: Former    Current packs/day: 0.00    Types: Cigarettes    Start date: 08/24/2000    Quit date: 08/24/2012    Years since quitting: 11.0   Smokeless tobacco: Never  Vaping Use   Vaping status: Never Used  Substance and Sexual Activity   Alcohol use: No    Alcohol/week: 0.0 standard drinks of alcohol   Drug use: No   Sexual  activity: Yes    Birth control/protection: None    Comment: Last encounter: [id number], Husband.  Other Topics Concern   Not on file  Social History Narrative   Not on file   Social Drivers of Health   Financial Resource Strain: Low Risk  (09/27/2023)   Overall Financial Resource Strain (CARDIA)    Difficulty of Paying Living Expenses: Not hard at all  Food Insecurity: No Food Insecurity (09/27/2023)   Hunger Vital Sign    Worried About Running Out of Food in the Last Year: Never true    Ran Out of Food in the  Last Year: Never true  Transportation Needs: No Transportation Needs (09/27/2023)   PRAPARE - Administrator, Civil Service (Medical): No    Lack of Transportation (Non-Medical): No  Physical Activity: Insufficiently Active (09/27/2023)   Exercise Vital Sign    Days of Exercise per Week: 4 days    Minutes of Exercise per Session: 30 min  Stress: Stress Concern Present (09/27/2023)   [employer] of Occupational Health - Occupational Stress Questionnaire    Feeling of Stress : To some extent  Social Connections: Socially Integrated (09/27/2023)   Social Connection and Isolation Panel [NHANES]    Frequency of Communication with Friends and Family: More than three times a week    Frequency of Social Gatherings with Friends and Family: Once a week    Attends Religious Services: More than 4 times per year    Active Member of [employer] or Organizations: No    Attends Engineer, structural: More than 4 times per year    Marital Status: Married  Catering manager Violence: Not on file   Social History   Tobacco Use  Smoking Status Former   Current packs/day: 0.00   Types: Cigarettes   Start date: 08/24/2000   Quit date: 08/24/2012   Years since quitting: 11.0  Smokeless Tobacco Never   Social History   Substance and Sexual Activity  Alcohol Use No   Alcohol/week: 0.0 standard drinks of alcohol    Family History:  Family History  Problem  Relation Age of Onset   Diabetes Mother    Asthma Mother    Diabetes Father    Heart attack Father    Heart disease Father    Asthma Sister    Bipolar disorder Sister    Breast cancer Cousin    Asthma Brother    Bipolar disorder Brother    Epilepsy Brother     Past medical history, surgical history, medications, allergies, family history and social history reviewed with patient today and changes made to appropriate areas of the chart.   Review of Systems  Constitutional: Negative.   HENT: Negative.    Eyes: Negative.   Respiratory: Negative.    Cardiovascular: Negative.   Gastrointestinal:  Positive for nausea. Negative for abdominal pain, blood in stool, constipation, diarrhea, heartburn, melena and vomiting.  Genitourinary: Negative.   Musculoskeletal:  Positive for joint pain. Negative for back pain, falls, myalgias and neck pain.  Skin: Negative.   Neurological: Negative.   Endo/Heme/Allergies:  Positive for environmental allergies and polydipsia. Does not bruise/bleed easily.  Psychiatric/Behavioral: Negative.     All other ROS negative except what is listed above and in the HPI.      Objective:    BP 136/80   Pulse 89   Temp (!) 97.5 F (36.4 C) (Oral)   Ht 5\' 7"  (1.702 m)   Wt 205 lb (93 kg)   LMP 03/24/2022 (Approximate)   SpO2 99%   BMI 32.11 kg/m   Wt Readings from Last 3 Encounters:  09/27/23 205 lb (93 kg)  06/28/23 211 lb (95.7 kg)  04/27/23 213 lb 3.2 oz (96.7 kg)    Physical Exam Vitals and nursing note reviewed.  Constitutional:      General: She is not in acute distress.    Appearance: Normal appearance. She is obese. She is not ill-appearing, toxic-appearing or diaphoretic.  HENT:     Head: Normocephalic and atraumatic.     Right Ear: Tympanic membrane, ear canal and external ear normal.  There is no impacted cerumen.     Left Ear: Tympanic membrane, ear canal and external ear normal. There is no impacted cerumen.     Nose: Nose normal. No  congestion or rhinorrhea.     Mouth/Throat:     Mouth: Mucous membranes are moist.     Pharynx: Oropharynx is clear. No oropharyngeal exudate or posterior oropharyngeal erythema.  Eyes:     General: No scleral icterus.       Right eye: No discharge.        Left eye: No discharge.     Extraocular Movements: Extraocular movements intact.     Conjunctiva/sclera: Conjunctivae normal.     Pupils: Pupils are equal, round, and reactive to light.  Neck:     Vascular: No carotid bruit.  Cardiovascular:     Rate and Rhythm: Normal rate and regular rhythm.     Pulses: Normal pulses.     Heart sounds: No murmur heard.    No friction rub. No gallop.  Pulmonary:     Effort: Pulmonary effort is normal. No respiratory distress.     Breath sounds: Normal breath sounds. No stridor. No wheezing, rhonchi or rales.  Chest:     Chest wall: No tenderness.  Abdominal:     General: Abdomen is flat. Bowel sounds are normal. There is no distension.     Palpations: Abdomen is soft. There is no mass.     Tenderness: There is no abdominal tenderness. There is no right CVA tenderness, left CVA tenderness, guarding or rebound.     Hernia: No hernia is present.  Genitourinary:    Comments: Breast and pelvic exams deferred with shared decision making Musculoskeletal:        General: No swelling, tenderness, deformity or signs of injury.     Cervical back: Normal range of motion and neck supple. No rigidity. No muscular tenderness.     Right lower leg: No edema.     Left lower leg: No edema.  Lymphadenopathy:     Cervical: No cervical adenopathy.  Skin:    General: Skin is warm and dry.     Capillary Refill: Capillary refill takes less than 2 seconds.     Coloration: Skin is not jaundiced or pale.     Findings: No bruising, erythema, lesion or rash.  Neurological:     General: No focal deficit present.     Mental Status: She is alert and oriented to person, place, and time. Mental status is at baseline.      Cranial Nerves: No cranial nerve deficit.     Sensory: No sensory deficit.     Motor: No weakness.     Coordination: Coordination normal.     Gait: Gait normal.     Deep Tendon Reflexes: Reflexes normal.  Psychiatric:        Mood and Affect: Mood normal.        Behavior: Behavior normal.        Thought Content: Thought content normal.        Judgment: Judgment normal.     Results for orders placed or performed in visit on 06/28/23  Hgb A1c w/o eAG   Collection Time: 06/28/23  4:29 PM  Result Value Ref Range   Hgb A1c MFr Bld 7.7 (H) 4.8 - 5.6 %      Assessment & Plan:   Problem List Items Addressed This Visit       Respiratory   Moderate persistent asthma without complication  Under good control on current regimen. Continue current regimen. Continue to monitor. Call with any concerns. Refills given.          Endocrine   Type 2 diabetes mellitus without complication, without long-term current use of insulin (HCC)   Improved with A1c down to 7.3 from 7.7. Continue current regimen. Continue diet and exercise. Recheck 3 months. Call with any concerns.       Relevant Medications   Semaglutide (RYBELSUS) 14 MG TABS   Other Relevant Orders   Bayer DCA Hb A1c Waived   CBC with Differential/Platelet   Comprehensive metabolic panel   Lipid Panel w/o Chol/HDL Ratio   TSH   Microalbumin, Urine Waived   Other Visit Diagnoses       Routine general medical examination at a health care facility    -  Primary   Vaccines up to date/declined. Screening labs checked today. Pap and mammo up to date. Continue diet and exercise. Call with any concerns.   Relevant Orders   Bayer DCA Hb A1c Waived   CBC with Differential/Platelet   Comprehensive metabolic panel   Lipid Panel w/o Chol/HDL Ratio   TSH   Microalbumin, Urine Waived        Follow up plan: Return in about 3 months (around 12/26/2023).   LABORATORY TESTING:  - Pap smear: up to date  IMMUNIZATIONS:   - Tdap:  Tetanus vaccination status reviewed: last tetanus booster within 10 years. - Influenza: Refused - Pneumovax: Up to date - Prevnar: Not applicable - COVID: Refused - HPV: Not applicable - Shingrix vaccine: Not applicable  SCREENING: -Mammogram: Up to date   PATIENT COUNSELING:   Advised to take 1 mg of folate supplement per day if capable of pregnancy.   Sexuality: Discussed sexually transmitted diseases, partner selection, use of condoms, avoidance of unintended pregnancy  and contraceptive alternatives.   Advised to avoid cigarette smoking.  I discussed with the patient that most people either abstain from alcohol or drink within safe limits (<=14/week and <=4 drinks/occasion for males, <=7/weeks and <= 3 drinks/occasion for females) and that the risk for alcohol disorders and other health effects rises proportionally with the number of drinks per week and how often a drinker exceeds daily limits.  Discussed cessation/primary prevention of drug use and availability of treatment for abuse.   Diet: Encouraged to adjust caloric intake to maintain  or achieve ideal body weight, to reduce intake of dietary saturated fat and total fat, to limit sodium intake by avoiding high sodium foods and not adding table salt, and to maintain adequate dietary potassium and calcium preferably from fresh fruits, vegetables, and low-fat dairy products.    stressed the importance of regular exercise  Injury prevention: Discussed safety belts, safety helmets, smoke detector, smoking near bedding or upholstery.   Dental health: Discussed importance of regular tooth brushing, flossing, and dental visits.    NEXT PREVENTATIVE PHYSICAL DUE IN 1 YEAR. Return in about 3 months (around 12/26/2023).

## 2023-09-27 NOTE — Assessment & Plan Note (Signed)
Under good control on current regimen. Continue current regimen. Continue to monitor. Call with any concerns. Refills given.   

## 2023-09-28 LAB — COMPREHENSIVE METABOLIC PANEL
ALT: 22 [IU]/L (ref 0–32)
AST: 15 [IU]/L (ref 0–40)
Albumin: 4.4 g/dL (ref 3.9–4.9)
Alkaline Phosphatase: 137 [IU]/L — ABNORMAL HIGH (ref 44–121)
BUN/Creatinine Ratio: 8 — ABNORMAL LOW (ref 9–23)
BUN: 6 mg/dL (ref 6–24)
Bilirubin Total: 0.3 mg/dL (ref 0.0–1.2)
CO2: 21 mmol/L (ref 20–29)
Calcium: 9.3 mg/dL (ref 8.7–10.2)
Chloride: 102 mmol/L (ref 96–106)
Creatinine, Ser: 0.8 mg/dL (ref 0.57–1.00)
Globulin, Total: 2.4 g/dL (ref 1.5–4.5)
Glucose: 133 mg/dL — ABNORMAL HIGH (ref 70–99)
Potassium: 4.3 mmol/L (ref 3.5–5.2)
Sodium: 137 mmol/L (ref 134–144)
Total Protein: 6.8 g/dL (ref 6.0–8.5)
eGFR: 94 mL/min/{1.73_m2} (ref >59)

## 2023-09-28 LAB — CBC WITH DIFFERENTIAL/PLATELET
Basophils Absolute: 0.1 10*3/uL (ref 0.0–0.2)
Basos: 1 %
EOS (ABSOLUTE): 0.1 10*3/uL (ref 0.0–0.4)
Eos: 1 %
Hematocrit: 36.5 % (ref 34.0–46.6)
Hemoglobin: 11.6 g/dL (ref 11.1–15.9)
Immature Grans (Abs): 0.1 10*3/uL (ref 0.0–0.1)
Immature Granulocytes: 1 %
Lymphocytes Absolute: 3.2 10*3/uL — ABNORMAL HIGH (ref 0.7–3.1)
Lymphs: 31 %
MCH: 23.2 pg — ABNORMAL LOW (ref 26.6–33.0)
MCHC: 31.8 g/dL (ref 31.5–35.7)
MCV: 73 fL — ABNORMAL LOW (ref 79–97)
Monocytes Absolute: 0.6 10*3/uL (ref 0.1–0.9)
Monocytes: 6 %
Neutrophils Absolute: 6.3 10*3/uL (ref 1.4–7.0)
Neutrophils: 60 %
Platelets: 591 10*3/uL — ABNORMAL HIGH (ref 150–450)
RBC: 4.99 x10E6/uL (ref 3.77–5.28)
RDW: 15.2 % (ref 11.7–15.4)
WBC: 10.3 10*3/uL (ref 3.4–10.8)

## 2023-09-28 LAB — LIPID PANEL W/O CHOL/HDL RATIO
Cholesterol, Total: 194 mg/dL (ref 100–199)
HDL: 30 mg/dL — ABNORMAL LOW (ref >39)
LDL Chol Calc (NIH): 125 mg/dL — ABNORMAL HIGH (ref 0–99)
Triglycerides: 216 mg/dL — ABNORMAL HIGH (ref 0–149)
VLDL Cholesterol Cal: 39 mg/dL (ref 5–40)

## 2023-09-28 LAB — MICROALBUMIN, URINE WAIVED
Creatinine, Urine Waived: 10 mg/dL (ref 10–300)
Microalb, Ur Waived: 10 mg/L (ref 0–19)
Microalb/Creat Ratio: <30 mg/g (ref <30)

## 2023-09-28 LAB — TSH: TSH: 1.41 u[IU]/mL (ref 0.450–4.500)

## 2023-09-28 LAB — BAYER DCA HB A1C WAIVED: HB A1C (BAYER DCA - WAIVED): 7.3 % — ABNORMAL HIGH (ref 4.8–5.6)

## 2023-10-25 ENCOUNTER — Encounter: Payer: Self-pay | Admitting: Nurse Practitioner

## 2023-10-25 ENCOUNTER — Ambulatory Visit: Payer: 59 | Admitting: Nurse Practitioner

## 2023-10-25 VITALS — BP 129/79 | HR 89 | Temp 98.5°F | Ht 67.0 in | Wt 204.4 lb

## 2023-10-25 DIAGNOSIS — M25562 Pain in left knee: Secondary | ICD-10-CM

## 2023-10-25 NOTE — Progress Notes (Signed)
BP 129/79 (BP Location: Right Arm, Patient Position: Sitting, Cuff Size: Large)   Pulse 89   Temp 98.5 F (36.9 C) (Oral)   Ht 5\' 7"  (1.702 m)   Wt 204 lb 6.4 oz (92.7 kg)   LMP 03/24/2022 (Approximate)   SpO2 99%   BMI 32.01 kg/m    Subjective:    Patient ID: Ashley Kidd, female    DOB: Jul 13, 1981, 43 y.o.   MRN: [id number]  HPI: Ashley Kidd is a 43 y.o. female  Chief Complaint  Patient presents with   Knee Pain    Started yesterday, pain radiates now to the posterior part of the patella, pain has become worse even when wearing a brace, prior treatment has been ice baths, elevation, and naproxen. Aleve too the edge off but pain still exist. Pain varies in intensity and is triggered when walking. Felt like it was going to give out a few times    KNEE PAIN Pain started yesterday. She has started exercising more.  She has been walking and using a pedal machine. Duration:  started yesterday Involved knee: left Mechanism of injury: unknown Location:posterior Onset: sudden Severity: 6/10  Quality:  sharp, burning, and stabbing Frequency: constant Radiation: yes- down into her lef Aggravating factors: weight bearing, walking, stairs, and bending  Alleviating factors: ice, NSAIDs, and brace  Status: worse Treatments attempted: ice and aleve  Relief with NSAIDs?:  moderate Weakness with weight bearing or walking: yes Sensation of giving way: yes Locking: no Popping: no Bruising: no Swelling: no Redness: no Paresthesias/decreased sensation: no Fevers: no  Relevant past medical, surgical, family and social history reviewed and updated as indicated. Interim medical history since our last visit reviewed. Allergies and medications reviewed and updated.  Review of Systems  Musculoskeletal:        Left knee pain    Per HPI unless specifically indicated above     Objective:    BP 129/79 (BP Location: Right Arm, Patient Position: Sitting, Cuff Size: Large)    Pulse 89   Temp 98.5 F (36.9 C) (Oral)   Ht 5\' 7"  (1.702 m)   Wt 204 lb 6.4 oz (92.7 kg)   LMP 03/24/2022 (Approximate)   SpO2 99%   BMI 32.01 kg/m   Wt Readings from Last 3 Encounters:  10/25/23 204 lb 6.4 oz (92.7 kg)  09/27/23 205 lb (93 kg)  06/28/23 211 lb (95.7 kg)    Physical Exam Vitals and nursing note reviewed.  Constitutional:      General: She is not in acute distress.    Appearance: Normal appearance. She is normal weight. She is not ill-appearing, toxic-appearing or diaphoretic.  HENT:     Head: Normocephalic.     Right Ear: External ear normal.     Left Ear: External ear normal.     Nose: Nose normal.     Mouth/Throat:     Mouth: Mucous membranes are moist.     Pharynx: Oropharynx is clear.  Eyes:     General:        Right eye: No discharge.        Left eye: No discharge.     Extraocular Movements: Extraocular movements intact.     Conjunctiva/sclera: Conjunctivae normal.     Pupils: Pupils are equal, round, and reactive to light.  Cardiovascular:     Rate and Rhythm: Normal rate and regular rhythm.     Heart sounds: No murmur heard. Pulmonary:     Effort: Pulmonary  effort is normal. No respiratory distress.     Breath sounds: Normal breath sounds. No wheezing or rales.  Musculoskeletal:     Cervical back: Normal range of motion and neck supple.     Left knee: No swelling, deformity, effusion, erythema or ecchymosis. Tenderness present over the LCL. No medial joint line tenderness. No LCL laxity, MCL laxity, ACL laxity or PCL laxity.Normal pulse.     Instability Tests: Anterior drawer test negative. Posterior drawer test negative. Anterior Lachman test negative. Medial McMurray test negative and lateral McMurray test negative.  Skin:    General: Skin is warm and dry.     Capillary Refill: Capillary refill takes less than 2 seconds.  Neurological:     General: No focal deficit present.     Mental Status: She is alert and oriented to person, place, and  time. Mental status is at baseline.  Psychiatric:        Mood and Affect: Mood normal.        Behavior: Behavior normal.        Thought Content: Thought content normal.        Judgment: Judgment normal.     Results for orders placed or performed in visit on 09/27/23  Bayer DCA Hb A1c Waived   Collection Time: 09/27/23  4:20 PM  Result Value Ref Range   HB A1C (BAYER DCA - WAIVED) 7.3 (H) 4.8 - 5.6 %  Microalbumin, Urine Waived   Collection Time: 09/27/23  4:20 PM  Result Value Ref Range   Microalb, Ur Waived 10 0 - 19 mg/L   Creatinine, Urine Waived 10 10 - 300 mg/dL   Microalb/Creat Ratio <30 <30 mg/g  CBC with Differential/Platelet   Collection Time: 09/27/23  4:22 PM  Result Value Ref Range   WBC 10.3 3.4 - 10.8 x10E3/uL   RBC 4.99 3.77 - 5.28 x10E6/uL   Hemoglobin 11.6 11.1 - 15.9 g/dL   Hematocrit 40.3 47.4 - 46.6 %   MCV 73 (L) 79 - 97 fL   MCH 23.2 (L) 26.6 - 33.0 pg   MCHC 31.8 31.5 - 35.7 g/dL   RDW 25.9 56.3 - 87.5 %   Platelets 591 (H) 150 - 450 x10E3/uL   Neutrophils 60 Not Estab. %   Lymphs 31 Not Estab. %   Monocytes 6 Not Estab. %   Eos 1 Not Estab. %   Basos 1 Not Estab. %   Neutrophils Absolute 6.3 1.4 - 7.0 x10E3/uL   Lymphocytes Absolute 3.2 (H) 0.7 - 3.1 x10E3/uL   Monocytes Absolute 0.6 0.1 - 0.9 x10E3/uL   EOS (ABSOLUTE) 0.1 0.0 - 0.4 x10E3/uL   Basophils Absolute 0.1 0.0 - 0.2 x10E3/uL   Immature Granulocytes 1 Not Estab. %   Immature Grans (Abs) 0.1 0.0 - 0.1 x10E3/uL  Comprehensive metabolic panel   Collection Time: 09/27/23  4:22 PM  Result Value Ref Range   Glucose 133 (H) 70 - 99 mg/dL   BUN 6 6 - 24 mg/dL   Creatinine, Ser 6.43 0.57 - 1.00 mg/dL   eGFR 94 >32 RJ/JOA/4.16   BUN/Creatinine Ratio 8 (L) 9 - 23   Sodium 137 134 - 144 mmol/L   Potassium 4.3 3.5 - 5.2 mmol/L   Chloride 102 96 - 106 mmol/L   CO2 21 20 - 29 mmol/L   Calcium 9.3 8.7 - 10.2 mg/dL   Total Protein 6.8 6.0 - 8.5 g/dL   Albumin 4.4 3.9 - 4.9 g/dL   Globulin,  Total  2.4 1.5 - 4.5 g/dL   Bilirubin Total 0.3 0.0 - 1.2 mg/dL   Alkaline Phosphatase 137 (H) 44 - 121 IU/L   AST 15 0 - 40 IU/L   ALT 22 0 - 32 IU/L  Lipid Panel w/o Chol/HDL Ratio   Collection Time: 09/27/23  4:22 PM  Result Value Ref Range   Cholesterol, Total 194 100 - 199 mg/dL   Triglycerides 409 (H) 0 - 149 mg/dL   HDL 30 (L) >81 mg/dL   VLDL Cholesterol Cal 39 5 - 40 mg/dL   LDL Chol Calc (NIH) 191 (H) 0 - 99 mg/dL  TSH   Collection Time: 09/27/23  4:22 PM  Result Value Ref Range   TSH 1.410 0.450 - 4.500 uIU/mL      Assessment & Plan:   Problem List Items Addressed This Visit   None    Follow up plan: No follow-ups on file.

## 2023-11-29 ENCOUNTER — Encounter: Payer: 59 | Attending: Psychology | Admitting: Psychology

## 2023-11-29 DIAGNOSIS — R41840 Attention and concentration deficit: Secondary | ICD-10-CM | POA: Diagnosis not present

## 2023-12-05 ENCOUNTER — Encounter: Payer: Self-pay | Admitting: Psychology
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
## 2023-12-09 DIAGNOSIS — R41840 Attention and concentration deficit: Secondary | ICD-10-CM

## 2023-12-09 NOTE — Progress Notes (Signed)
 Behavioral Observations: The patient appeared well-groomed and appropriately dressed. Her manners were polite and appropriate to the situation. The patient's attitude towards testing was positive and her effort was good. The patient remained on-task and cooperative for the duration of testing and had no difficulties with understanding testing instructions. The patient walked at a quick and steady pace and no mobility issues were observed. The patient reported that she slept about 9 hours the night prior to testing, waking up once around 3:00AM. The patient expressed some anxiety surrounding her performance on testing. No language issues were noted.    Neuropsychology Note  Ashley Kidd completed 130 minutes of neuropsychological testing with technician, Staci Acosta, BA, under the supervision of Doy Mince, PsyD., Clinical Neuropsychologist. The patient did not appear overtly distressed by the testing session, per behavioral observation or via self-report to the technician. Rest breaks were offered.   Clinical Decision Making: In considering the patient's current level of functioning, level of presumed impairment, nature of symptoms, emotional and behavioral responses during clinical interview, level of literacy, and observed level of motivation/effort, a battery of tests was selected by Dr. Matt Holmes during initial consultation on 11/29/2023. This was communicated to the technician. Communication between the neuropsychologist and technician was ongoing throughout the testing session and changes were made as deemed necessary based on patient performance on testing, technician observations and additional pertinent factors such as those listed above.  Tests Administered: Brief Visuospatial Memory Test-Revised (BVMT-R) DIRECTV Verbal Learning Test-Third Edition (CVLT-3) Conners Continuous Performance Test 3rd Edition (CPT-3) Controlled Oral Word Association Test (FAS & Animals) Delis-Kaplan  Executive Function System (D-KEFS), select subtests Rey Complex Figure Test (RCFT), select subtests Wechsler Adult Intelligence Scale-Fourth Edition (WAIS-IV), select subtests Wechsler Memory Scale-Fourth Edition (WMS-IV), select subtests  Wechsler Memory Scale-Third Edition (WMS-III), select subtests  [facility] [phone]) The Beck Depression Inventory-II (BDI-II) Beck Anxiety Inventory (BAI) PTSD Checklist for DSM-5 (PCL-5)  Results:  ATTENTION AND WORKING MEMORY      Norm Score Percentile  Range  WAIS-IV            Digit Span    ss = 13 84 %ile High Average   DSF    ss = 16 98 %ile Exceptionally High   Span:      9      DSB    ss = 9 37 %ile Average   Span:      4      DSS    ss = 11 63 %ile Average   Span:      6     WMS-III            Spatial Span    ss = 13 84 %ile High Average   SSF    ss = 11 63 %ile Average   Span:      6      SSB    ss = 14 91 %ile Above Average   Span:      6      COMPLEX ATTENTION      Norm Score Percentile Qualitative Discriptor  CPT-3            Response Style           Speed over accuracy   Detectability (reverse scored)    t = 77 99 %ile Elevated   Omission Errors    t = 58 92 %ile Slight Elevation   Commission Errors    t = 84 99 %  ile Elevated   Perseverations    t = 64 92 %ile Elevated   HRT (mean reaction time)    t = 57 80 %ile Slow   HRT SD (RT variability)    t = 57 84 %ile Slight Elevation   Variability (in RT consistency)    t = 74 96 %ile Elevated   HRT Block Change (across test)    t = 50 49 %ile Average   HRT ISI Change (by stimuli rate)    t = 57 77 %ile Slight Elevation   PROCESSING SPEED      Norm Score Percentile  Range  WAIS-IV            Coding    ss = 12 75 %ile High Average   Symbol Search    ss = 12 75 %ile High Average   LANGUAGE           COWAT            FAS    t = 38 12 %ile Low Average   Animals    t = 48 42 %ile Average  DKEFS - Verbal Fluency            Category Switching    ss = 10 50 %ile  Average   Switching Accuracy    ss = 11 63 %ile Average  WAIS- IV             Similarities    ss = 10 50 %ile Average   EXECUTIVE FUNCTIONING      Norm Score Percentile  Range  DKEFS - Color-Word Interference            Color Naming    ss = 12 75 %ile High Average   Word Reading    ss = 10 50 %ile Average   Inhibition    ss = 9 37 %ile Average   Errors    ss = 7 16 %ile Low Average   Inhibition Switching    ss = 11 63 %ile Average   Errors    ss = 11 63 %ile Average  DKEFS - Trails            Condition 1    ss = 11 63 %ile Average   Condition II    ss = 12 75 %ile High Average   Condition III    ss = 12 75 %ile High Average   Condition IV    ss = 13 84 %ile High Average   Condition V    ss = 11 63 %ile Average  Wisconsin Card Sorting Test (WCST)            Total Correct             WCST Total Errors    t = 37 9 %ile Low Average   WCST Perseverative Errors    t = 43 25 %ile Average   WCST Non-Perseverative Errors    t = 34 5 %ile Below Average   WCST Conceptual Responses    t = 41 18 %ile Low Average   # Categories Completed       11 to 16 %ile Low Average   Trials to First       2 to 5 %ile Below Average   FTMS       6 to 10 %ile Low to Below Average   MEMORY      Norm Score Percentile  Range  BVMT-R  Trial 1    t = 68.0 96 %ile Above Average   Trial 2    t = 61 86 %ile High Average   Trial 3    t = 62.0 88 %ile High Average   Total Recall    t = 65.0 94 %ile Above Average   Learning    t = 41.0 18 %ile Low Average   Delayed Recall    t = 47.0 37 %ile Average   % Retained        6-10 %ile Low to Below Average   Hits       >16 %ile WNL   False Alarms       >16 %ile WNL   Recognition Discriminability       >16 %ile WNL  CVLT-III            Trial 1    ss = 11.0 63 %ile Average   Trial 2    ss = 11.0 63 %ile Average   Trial 3    ss = 10.0 50 %ile Average   Trial 4    ss = 11.0 63 %ile Average   Trial 5    ss = 13.0 84 %ile High Average   Trial B    ss = 11.0 63  %ile Average   Short Delay Free Recall    ss = 13.0 84 %ile High Average   Short Delay Cued Recall    ss = 14.0 91 %ile Above Average   Long Delay Free Recall    ss = 14.0 91 %ile Above Average   Long Delay Cued Recall    ss = 13.0 84 %ile High Average   Total Hits    ss = 13.0 84 %ile High Average   Total False Positives    ss = 13.0 84 %ile High Average   Recognition Discriminability    ss = 14.0 91 %ile Above Average   Total Intrusions    ss = 14.0 91 %ile Above Average   Trials 1-5 Total Correct    SS = 107 68 %ile Average   Total Repetitions    ss = 12.0 75 %ile High Average   List B vs. Trial 1    ss = 11.0 63 %ile Average   SD (FR) vs. Trial 5 Correct    ss = 12.0 75 %ile High Average   LD (FR)vs. SD (FR)    ss = 13.0 84 %ile High Average   Wechsler Memory Scale, 4th Edition (WMS-4)           Log. Mem. Immediate Recall    ss = 14 91 %ile Above Average   Logical Memory Delayed Recall    ss = 12 75 %ile High Average   Logical Recognition        >75th  %ile High Average   VISUAL-SPATIAL      Norm Score Percentile  Range  WAIS-IV            Block Design    ss = 7 16 %ile Low Average  Rey Complex Figure Copy         2 to 5 %ile  Low to Below Average    PERSONALITY AND BEHAVIORAL FUNCTIONING        Score/Interpretation  BDI Raw         6  BDI Severity         Minimal.  BAI Raw  8  BAI Severity         Mild.  PCL-5         37  PCL-5 (cutoff >33)         Elevated    Feedback to Patient: Ashley Kidd will return on 12/16/2023 for an interactive feedback session with Dr. Matt Holmes at which time her test performances, clinical impressions and treatment recommendations will be reviewed in detail. The patient understands she can contact our office should she require our assistance before this time.  130 minutes spent face-to-face with patient administering standardized tests, 30 minutes spent scoring Radiographer, therapeutic). [CPT [id number], [id number]]  Full report to follow.

## 2023-12-10 ENCOUNTER — Encounter: Payer: 59 | Admitting: Psychology

## 2023-12-10 DIAGNOSIS — R41840 Attention and concentration deficit: Secondary | ICD-10-CM | POA: Diagnosis not present

## 2023-12-16 ENCOUNTER — Encounter: Payer: 59 | Admitting: Psychology

## 2023-12-16 DIAGNOSIS — R41840 Attention and concentration deficit: Secondary | ICD-10-CM | POA: Diagnosis not present

## 2023-12-21 ENCOUNTER — Encounter: Payer: Self-pay | Admitting: Psychology

## 2023-12-21 NOTE — Progress Notes (Signed)
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 milder difficulties with impulsivity and sustained attention.  The only other area of some difficulty involved certain areas of executive functioning.  Here, she again showed mild difficulties with basic response inhibition.  On a measure of applied reasoning she showed some difficulties that are likely secondary to inattention but also mild problems with cognitive flexibility.  She had no difficulty with executive functioning with respect to set shifting or abstract verbal reasoning.  She appeared to have a slight relative weakness with visual spatial tasks.  Endorsements on self-report measures of mood and anxiety showed mild anxiety related symptoms.  On the PTSD checklist her total score was elevated and suggested that she might benefit from trauma related treatment.  Her specific item endorsements on that measure were consistent with PTSD criteria.  With respect to ADHD, the patient described a symptom history during childhood that is suggestive of ADHD. That said, she has a notable history of childhood trauma and the  impact of this on behavior cannot be fully controlled for. The functional impact of symptoms of ADHD during her youth outside of school is difficult determine given the context in which she grew up (ie., very rigid structure in the home etc.). There is consistency between difficulties endorsed during childhood and symptoms which persist to date. There is reasonable likelihood that the patient experiences symptoms that are neurodevelopmental in nature and consistent with ADHD. However, the patient also difficulties with anxiety and emotional regulation which, while not inconsistent with ADHD, are readily able to interfere with cognitive  functioning. These difficulties are long-running and may potentially be secondary to PTSD symptomology. Of note, in discussion during the feedback appointment for the current evaluation. The patient disclosed that clinical interview had elicited thoughts about her past traumas which distressed her. She indicated that, in the time between the intake and the feedback appointment, she decided to pursue individual therapy and had already completed a preliminary interview with a therapist. Ultimately, the patient may have difficulties caused by ADHD, but is likely also experiencing cognitive difficulties related to anxiety and untreated trauma.   Based on background obtained from the patient through interview, it is clear she has made impressive progress in developing insight into her own emotional and psychological wellness on her own even prior to the initial appointment. She did so independently. The provider encouraged the patient to continue on this path, and also praised her decision to seek formal therapy as this was a recommendation the provider planned to make. It is likely that doing this work will improve the patient's emotional functioning and also reduce interference symptoms have on cognitive functioning. Whether or not ADHD is present, addressing this area is the  recommended focus with respect to next steps. Diagnostically, if ADHD symptoms persist despite improvements in other areas of mental health, that would more strongly implicate that disorder. A formal neurocognitive evaluation would not be necessary at that time, and in-fact may not provide significant diagnostic utility relative to insight a treating therapist will develop through background collected during the course of treatment. However, if desired a re-evaluation can be conducted in the future following improved management of PTSD symptomology. A final item, as discussed with the patient, relates to self-care. The patient describes sleep difficulties, fatigue, and a demanding day-to-day life that leaves little time for herself. This can readily impact concentration and attention, as well as other areas such as processing speed, independent of ADHD or other mental health concerns. The patient is encouraged to, as she did with finding a therapist, make time for her own wellness. This can improve not only cognitive functioning, it can also improve emotion regulation. That said, the patient appears to be on the right path forward, and will likely continue to progress, as she has done so far.    Diagnosis: Attention and concentration deficit Post-traumatic stress disorder (provisional diagnosis)  Recommendations:  The patient is encouraged to continue engaging with medical providers. Managing chronic health conditions is recommended given the impact physical health on cognitive health (interference from poor DM control).  Clarity regarding a formal diagnosis of ADHD would benefit from improved control of suspected PTSD symptomology. However, the patient's presentation does likely reflect some contributions from ADHD.  The patient is encouraged to continue in her efforts to connect with an individual therapist for work on mental health concerns. This will likely improve not only cognitive functioning, but  overall wellness.  The patient's sleep difficulties may also be contributing to cognitive symptoms. "Sleep-hygiene" is a term used for a range of strategies and techniques for improving sleep quality. There are many resources online which a search for "Sleep-hygiene" will produce. I've included a brief list of some tips at the end of this report which may be beneficial.  As discussed, taking time for self care and prioritizing your own wellness is important. In addition to the work you've already done, ensuring you make time to engage in stress-relieving tasks (I.e., exercising).  Cognitively, when able to control for environmental distractions, you perform very well. Continuing to recognize this tendency  and compensating for it (when possible) is recommended).  It was a pleasure to work with you for the evaluation. If you have any questions, please feel free to reach out via MyChart or phone.    This report was generated using voice recognition software. While this document has been carefully reviewed, transcription errors may be present. I apologize in advance for any inconvenience. Please contact me if further clarification is needed.             Thelma Comp, PsyD             Neuropsychologist     Sleep-Hygiene tips: Adequate sleep is important for optimal physical health and also cognitive functioning. Good sleep hygiene can be beneficial for both falling asleep and for returning to sleep after waking:  Keep a consistent sleep schedule. Get up at the same time every day, even on weekends or during vacations. Only lay in bed when sleepy, as this will help create a better association between your bed and a good night rest. Reduce light exposure; utilize dim lighting in the evening and particularly around the time you are going to bed. Bright, and even typical room lighting can impact circadian rhythms and melatonin release (both important for sleep). If you don't fall asleep after 20  minutes, get out of bed. Go do a quiet activity without a lot of light exposure. Make your bedroom quiet and relaxing. Do not use electronic devices in bed.  Keep the room at a comfortable, cool temperature. Exercise regularly and maintain a healthy diet.  Avoid consuming caffeine in the afternoon or evening. Avoid consuming alcohol before bedtime.  Avoid taking naps throughout the day, when possible. If waking and struggling to return to sleep: If unable to fall back asleep within about 20 minutes, get out of bed and do something relaxing to distract yourself, but avoid full lighting/blue light and cognitively demanding/stimulating tasks.  Engaging in relaxation exercises or meditation exercises can be helpful. A number of guided relaxation exercises can be found on youtube (identify some to use ahead of time so you don't need to search for one in the moment).

## 2023-12-22 NOTE — Progress Notes (Signed)
   NEUROPSYCHOLOGICAL EVALUATION [place]  Physical Medicine and Rehabilitation     Patient: Ashley Kidd  MRN: [id number] DOB: 1981/02/21   Service Provider/Clinical Neuropsychologist: Thelma Comp, PsyD  Date of Service: 12/16/23 Start Time: 4:00 PM End Time: 5:00 PM  Location of Service:  [facility] Physical Medicine & Rehabilitation Department [place]. [facility] [address] [place] [place], [place] [address] Phone: [phone]   Billing Code/Service: 96132/ interactive feedback appointment for neuropsychological evaluation   Individuals present: Patient was seen unaccompanied by the provider. The patient was seen face-to-face for the 60 minute appointment.    The provider reviewed and discussed the results of neuropsychological testing evaluation with the patient in full. This included overall findings, diagnosis, and treatment planning/recommendations that were derived from integration of patient data, interpretation of standardized rest results and clinical data, and clinical decision making, which are documented in the patient's electronic medical record (encounter date listed below)  The patient expressed understanding of the information reviewed. The patient was provided opportunity to ask questions which were answered by the provider. The provider worked collaboratively to tailor treatment recommendations to the patient when possible. The patient was informed they would be provided a copy of the full report and the patient was encouraged to reach out to the provider with any additional questions that might arise relating to the evaluation and findings.   The final neuropsychological evaluation report, documented in the patient's chart on 12/10/23, was amended to reflect any additional information obtained during the feedback appointment included treatment planning collaboration.    This report was generated using voice  recognition software. While this document has been carefully reviewed, transcription errors may be present. I apologize in advance for any inconvenience. Please contact me if further clarification is needed.             Thelma Comp, PsyD             Neuropsychologist

## 2023-12-31 ENCOUNTER — Ambulatory Visit: Payer: Self-pay | Admitting: Family Medicine

## 2024-01-04 ENCOUNTER — Ambulatory Visit: Admitting: Family Medicine

## 2024-01-04 ENCOUNTER — Encounter: Payer: Self-pay | Admitting: Family Medicine

## 2024-01-04 VITALS — BP 126/80 | HR 87 | Temp 98.2°F | Ht 67.0 in | Wt 197.4 lb

## 2024-01-04 DIAGNOSIS — F419 Anxiety disorder, unspecified: Secondary | ICD-10-CM | POA: Diagnosis not present

## 2024-01-04 DIAGNOSIS — E119 Type 2 diabetes mellitus without complications: Secondary | ICD-10-CM | POA: Diagnosis not present

## 2024-01-04 LAB — BAYER DCA HB A1C WAIVED: HB A1C (BAYER DCA - WAIVED): 6.4 % — ABNORMAL HIGH (ref 4.8–5.6)

## 2024-01-04 MED ORDER — SUMATRIPTAN SUCCINATE 100 MG PO TABS: 100.0000 mg | ORAL_TABLET | ORAL | 12 refills | Status: AC | PRN

## 2024-01-04 MED ORDER — OMEPRAZOLE 40 MG PO CPDR: DELAYED_RELEASE_CAPSULE | ORAL | 1 refills | Status: DC

## 2024-01-04 MED ORDER — MONTELUKAST SODIUM 10 MG PO TABS: ORAL_TABLET | ORAL | 1 refills | Status: DC

## 2024-01-04 MED ORDER — TIOTROPIUM BROMIDE MONOHYDRATE 18 MCG IN CAPS: 18.0000 ug | ORAL_CAPSULE | Freq: Every day | RESPIRATORY_TRACT | 12 refills | Status: AC

## 2024-01-04 MED ORDER — QVAR REDIHALER 80 MCG/ACT IN AERB: 1.0000 | INHALATION_SPRAY | Freq: Two times a day (BID) | RESPIRATORY_TRACT | 4 refills | Status: AC

## 2024-01-04 MED ORDER — AIRSUPRA 90-80 MCG/ACT IN AERO: 2.0000 | INHALATION_SPRAY | RESPIRATORY_TRACT | 6 refills | Status: AC

## 2024-01-04 MED ORDER — ALBUTEROL SULFATE HFA 108 (90 BASE) MCG/ACT IN AERS: INHALATION_SPRAY | RESPIRATORY_TRACT | 6 refills | Status: AC

## 2024-01-04 MED ORDER — CITALOPRAM HYDROBROMIDE 20 MG PO TABS: 20.0000 mg | ORAL_TABLET | Freq: Every day | ORAL | 1 refills | Status: DC

## 2024-01-04 NOTE — Assessment & Plan Note (Signed)
 Doing great with A1c of 6.4! Keep up the good work. Recheck 3 months. Call with any concerns.

## 2024-01-04 NOTE — Progress Notes (Signed)
 BP 126/80 (BP Location: Left Arm, Patient Position: Sitting)   Pulse 87   Temp 98.2 F (36.8 C) (Oral)   Ht 5\' 7"  (1.702 m)   Wt 197 lb 6.4 oz (89.5 kg)   LMP 03/24/2022 (Approximate)   SpO2 98%   BMI 30.92 kg/m    Subjective:    Patient ID: Ashley Kidd, female    DOB: [birth year], 43 y.o.   MRN: [id number]  HPI: Ashley Kidd is a 43 y.o. female  Chief Complaint  Patient presents with   Diabetes   DIABETES Hypoglycemic episodes:no Polydipsia/polyuria: no Visual disturbance: no Chest pain: no Paresthesias: no Glucose Monitoring: yes  Accucheck frequency: Daily Taking Insulin?: no Blood Pressure Monitoring: rarely Retinal Examination: Up to Date Foot Exam: Up to Date Diabetic Education: Completed Pneumovax: Up to Date Influenza: Up to Date Aspirin: no  ANXIETY/STRESS Duration: chronic Status:worse with her step-father's recent death Anxious mood: yes  Excessive worrying: no Irritability: no  Sweating: no Nausea: no Palpitations:no Hyperventilation: no Panic attacks: no Agoraphobia: no  Obscessions/compulsions: no Depressed mood: yes    01/04/2024    8:22 AM 10/25/2023    1:54 PM 09/27/2023    4:32 PM 06/28/2023    4:44 PM 04/27/2023    3:54 PM  Depression screen PHQ 2/9  Decreased Interest 0 1 0 1 1  Down, Depressed, Hopeless 0 0 0 0 0  PHQ - 2 Score 0 1 0 1 1  Altered sleeping 3 2 0 3 2  Tired, decreased energy 2 2 3 2 2   Change in appetite 1 0 1 1 1   Feeling bad or failure about yourself  0 0 1 0 0  Trouble concentrating 0 2 0 0 0  Moving slowly or fidgety/restless 1 0 0 0 0  Suicidal thoughts 0 0 0 0 0  PHQ-9 Score 7 7 5 7 6   Difficult doing work/chores Extremely dIfficult   Somewhat difficult Somewhat difficult   Anhedonia: no Weight changes: no Insomnia: no   Hypersomnia: no Fatigue/loss of energy: no Feelings of worthlessness: no Feelings of guilt: no Impaired concentration/indecisiveness: no Suicidal ideations: no  Crying  spells: no Recent Stressors/Life Changes: yes   Relationship problems: no   Family stress: yes     Financial stress: no    Job stress: no    Recent death/loss: yes   Relevant past medical, surgical, family and social history reviewed and updated as indicated. Interim medical history since our last visit reviewed. Allergies and medications reviewed and updated.  Review of Systems  Constitutional: Negative.   Respiratory: Negative.    Cardiovascular: Negative.   Musculoskeletal: Negative.   Skin: Negative.   Neurological: Negative.   Psychiatric/Behavioral:  Positive for dysphoric mood. Negative for agitation, behavioral problems, confusion, decreased concentration, hallucinations, self-injury, sleep disturbance and suicidal ideas. The patient is nervous/anxious. The patient is not hyperactive.     Per HPI unless specifically indicated above     Objective:    BP 126/80 (BP Location: Left Arm, Patient Position: Sitting)   Pulse 87   Temp 98.2 F (36.8 C) (Oral)   Ht 5\' 7"  (1.702 m)   Wt 197 lb 6.4 oz (89.5 kg)   LMP 03/24/2022 (Approximate)   SpO2 98%   BMI 30.92 kg/m   Wt Readings from Last 3 Encounters:  01/04/24 197 lb 6.4 oz (89.5 kg)  10/25/23 204 lb 6.4 oz (92.7 kg)  09/27/23 205 lb (93 kg)    Physical Exam Vitals and  nursing note reviewed.  Constitutional:      General: She is not in acute distress.    Appearance: Normal appearance. She is obese. She is not ill-appearing, toxic-appearing or diaphoretic.  HENT:     Head: Normocephalic and atraumatic.     Right Ear: External ear normal.     Left Ear: External ear normal.     Nose: Nose normal.     Mouth/Throat:     Mouth: Mucous membranes are moist.     Pharynx: Oropharynx is clear.  Eyes:     General: No scleral icterus.       Right eye: No discharge.        Left eye: No discharge.     Extraocular Movements: Extraocular movements intact.     Conjunctiva/sclera: Conjunctivae normal.     Pupils: Pupils  are equal, round, and reactive to light.  Cardiovascular:     Rate and Rhythm: Normal rate and regular rhythm.     Pulses: Normal pulses.     Heart sounds: Normal heart sounds. No murmur heard.    No friction rub. No gallop.  Pulmonary:     Effort: Pulmonary effort is normal. No respiratory distress.     Breath sounds: Normal breath sounds. No stridor. No wheezing, rhonchi or rales.  Chest:     Chest wall: No tenderness.  Musculoskeletal:        General: Normal range of motion.     Cervical back: Normal range of motion and neck supple.  Skin:    General: Skin is warm and dry.     Capillary Refill: Capillary refill takes less than 2 seconds.     Coloration: Skin is not jaundiced or pale.     Findings: No bruising, erythema, lesion or rash.  Neurological:     General: No focal deficit present.     Mental Status: She is alert and oriented to person, place, and time. Mental status is at baseline.  Psychiatric:        Mood and Affect: Mood normal.        Behavior: Behavior normal.        Thought Content: Thought content normal.        Judgment: Judgment normal.     Results for orders placed or performed in visit on 09/27/23  Bayer DCA Hb A1c Waived   Collection Time: 09/27/23  4:20 PM  Result Value Ref Range   HB A1C (BAYER DCA - WAIVED) 7.3 (H) 4.8 - 5.6 %  Microalbumin, Urine Waived   Collection Time: 09/27/23  4:20 PM  Result Value Ref Range   Microalb, Ur Waived 10 0 - 19 mg/L   Creatinine, Urine Waived 10 10 - 300 mg/dL   Microalb/Creat Ratio <30 <30 mg/g  CBC with Differential/Platelet   Collection Time: 09/27/23  4:22 PM  Result Value Ref Range   WBC 10.3 3.4 - 10.8 x10E3/uL   RBC 4.99 3.77 - 5.28 x10E6/uL   Hemoglobin 11.6 11.1 - 15.9 g/dL   Hematocrit 29.5 28.4 - 46.6 %   MCV 73 (L) 79 - 97 fL   MCH 23.2 (L) 26.6 - 33.0 pg   MCHC 31.8 31.5 - 35.7 g/dL   RDW 13.2 44.0 - 10.2 %   Platelets 591 (H) 150 - 450 x10E3/uL   Neutrophils 60 Not Estab. %   Lymphs 31 Not  Estab. %   Monocytes 6 Not Estab. %   Eos 1 Not Estab. %   Basos 1 Not  Estab. %   Neutrophils Absolute 6.3 1.4 - 7.0 x10E3/uL   Lymphocytes Absolute 3.2 (H) 0.7 - 3.1 x10E3/uL   Monocytes Absolute 0.6 0.1 - 0.9 x10E3/uL   EOS (ABSOLUTE) 0.1 0.0 - 0.4 x10E3/uL   Basophils Absolute 0.1 0.0 - 0.2 x10E3/uL   Immature Granulocytes 1 Not Estab. %   Immature Grans (Abs) 0.1 0.0 - 0.1 x10E3/uL  Comprehensive metabolic panel   Collection Time: 09/27/23  4:22 PM  Result Value Ref Range   Glucose 133 (H) 70 - 99 mg/dL   BUN 6 6 - 24 mg/dL   Creatinine, Ser 5.62 0.57 - 1.00 mg/dL   eGFR 94 >13 YQ/MVH/8.46   BUN/Creatinine Ratio 8 (L) 9 - 23   Sodium 137 134 - 144 mmol/L   Potassium 4.3 3.5 - 5.2 mmol/L   Chloride 102 96 - 106 mmol/L   CO2 21 20 - 29 mmol/L   Calcium 9.3 8.7 - 10.2 mg/dL   Total Protein 6.8 6.0 - 8.5 g/dL   Albumin 4.4 3.9 - 4.9 g/dL   Globulin, Total 2.4 1.5 - 4.5 g/dL   Bilirubin Total 0.3 0.0 - 1.2 mg/dL   Alkaline Phosphatase 137 (H) 44 - 121 IU/L   AST 15 0 - 40 IU/L   ALT 22 0 - 32 IU/L  Lipid Panel w/o Chol/HDL Ratio   Collection Time: 09/27/23  4:22 PM  Result Value Ref Range   Cholesterol, Total 194 100 - 199 mg/dL   Triglycerides 962 (H) 0 - 149 mg/dL   HDL 30 (L) >95 mg/dL   VLDL Cholesterol Cal 39 5 - 40 mg/dL   LDL Chol Calc (NIH) 284 (H) 0 - 99 mg/dL  TSH   Collection Time: 09/27/23  4:22 PM  Result Value Ref Range   TSH 1.410 0.450 - 4.500 uIU/mL      Assessment & Plan:   Problem List Items Addressed This Visit       Endocrine   Type 2 diabetes mellitus without complication, without long-term current use of insulin (HCC)   Doing great with A1c of 6.4! Keep up the good work. Recheck 3 months. Call with any concerns.         Other   Anxiety   In exacerbation with the death of her step-father. Would like to keep on same dose of celexa. Call with any concerns.       Relevant Medications   citalopram (CELEXA) 20 MG tablet   Other Visit  Diagnoses       Controlled type 2 diabetes mellitus without complication, without long-term current use of insulin (HCC)    -  Primary   Relevant Orders   Bayer DCA Hb A1c Waived (STAT)   Pneumococcal conjugate vaccine 20-valent (Prevnar 20) (Completed)        Follow up plan: Return in about 3 months (around 04/04/2024).

## 2024-01-04 NOTE — Assessment & Plan Note (Signed)
 In exacerbation with the death of her step-father. Would like to keep on same dose of celexa. Call with any concerns.

## 2024-02-04 ENCOUNTER — Other Ambulatory Visit: Payer: Self-pay | Admitting: Family Medicine

## 2024-02-04 LAB — OPHTHALMOLOGY REPORT-SCANNED

## 2024-02-07 NOTE — Telephone Encounter (Signed)
 Requested Prescriptions  Refused Prescriptions Disp Refills   montelukast  (SINGULAIR ) 10 MG tablet [Pharmacy Med Name: MONTELUKAST  10MG  TABLETS] 90 tablet 1    Sig: TAKE 1 TABLET(10 MG) BY MOUTH AT BEDTIME     Pulmonology:  Leukotriene Inhibitors Failed - 02/07/2024 12:24 PM      Failed - Valid encounter within last 12 months    Recent Outpatient Visits           1 month ago Controlled type 2 diabetes mellitus without complication, without long-term current use of insulin (HCC)   [place] [facility] [place], Megan P, DO               omeprazole  (PRILOSEC) 40 MG capsule [Pharmacy Med Name: OMEPRAZOLE  40MG  CAPSULES] 90 capsule 1    Sig: TAKE 1 CAPSULE(40 MG) BY MOUTH DAILY     Gastroenterology: Proton Pump Inhibitors Failed - 02/07/2024 12:24 PM      Failed - Valid encounter within last 12 months    Recent Outpatient Visits           1 month ago Controlled type 2 diabetes mellitus without complication, without long-term current use of insulin (HCC)   [place] [facility] [place], [place], DO

## 2024-03-29 ENCOUNTER — Encounter: Payer: 59 | Admitting: Psychology

## 2024-04-04 ENCOUNTER — Other Ambulatory Visit: Payer: Self-pay | Admitting: Family Medicine

## 2024-04-06 NOTE — Telephone Encounter (Signed)
 Requested medication (s) are due for refill today: yes  Requested medication (s) are on the active medication list: yes  Last refill:  04/27/23 #84 3 RF  Future visit scheduled: yes  Notes to clinic:  trade name is different form what was previously ordered   Requested Prescriptions  Pending Prescriptions Disp Refills   GALLIFREY  5 MG tablet [Pharmacy Med Name: GALLIFREY  5MG  TABLETS] 84 tablet 3    Sig: TAKE 1 TABLET(5 MG) BY MOUTH DAILY     OB/GYN: Contraceptives - Progestins Passed - 04/06/2024 11:15 AM      Passed - Last BP in normal range    BP Readings from Last 1 Encounters:  01/04/24 126/80         Passed - Valid encounter within last 12 months    Recent Outpatient Visits           3 months ago Controlled type 2 diabetes mellitus without complication, without long-term current use of insulin [facility])   [place] [facility] [place], [place], [place]              Passed - Patient is not a smoker

## 2024-04-11 ENCOUNTER — Encounter: Payer: Self-pay | Admitting: Family Medicine

## 2024-04-11 ENCOUNTER — Ambulatory Visit: Admitting: Family Medicine

## 2024-04-11 VITALS — BP 122/78 | HR 92 | Temp 98.2°F | Resp 16 | Ht 67.01 in | Wt 201.8 lb

## 2024-04-11 DIAGNOSIS — F419 Anxiety disorder, unspecified: Secondary | ICD-10-CM

## 2024-04-11 DIAGNOSIS — S99921A Unspecified injury of right foot, initial encounter: Secondary | ICD-10-CM | POA: Diagnosis not present

## 2024-04-11 DIAGNOSIS — Z789 Other specified health status: Secondary | ICD-10-CM | POA: Diagnosis not present

## 2024-04-11 DIAGNOSIS — E119 Type 2 diabetes mellitus without complications: Secondary | ICD-10-CM

## 2024-04-11 LAB — BAYER DCA HB A1C WAIVED: HB A1C (BAYER DCA - WAIVED): 6.9 % — ABNORMAL HIGH (ref 4.8–5.6)

## 2024-04-11 MED ORDER — CITALOPRAM HYDROBROMIDE 20 MG PO TABS: 40.0000 mg | ORAL_TABLET | Freq: Every day | ORAL | 1 refills | Status: DC

## 2024-04-11 NOTE — Progress Notes (Signed)
 BP 122/78 (BP Location: Left Arm, Patient Position: Sitting, Cuff Size: Normal)   Pulse 92   Temp 98.2 F (36.8 C) (Oral)   Resp 16   Ht 5' 7.01 (1.702 m)   Wt 201 lb 12.8 oz (91.5 kg)   LMP 03/24/2022 (Approximate)   SpO2 99%   BMI 31.60 kg/m    Subjective:    Patient ID: Ashley Kidd, female    DOB: [birth year], 43 y.o.   MRN: [id number]  HPI: Ashley Kidd is a 43 y.o. female  Chief Complaint  Patient presents with   Diabetes    Ranging high 140-150 am fasting. Under lots of stress.    Anxiety/Depression    Celexa  is helping to manage.    Toe Pain    Hurt past Thursday and had gotten red swollen over the weekend.    DIABETES Hypoglycemic episodes:no Polydipsia/polyuria: no Visual disturbance: no Chest pain: no Paresthesias: no Glucose Monitoring: yes  Accucheck frequency: Daily Taking Insulin?: no Blood Pressure Monitoring: not checking Retinal Examination: Up to Date Foot Exam: Up to Date Diabetic Education: Completed Pneumovax: Up to Date Influenza: Up to Date Aspirin: no  ANXIETY/STRESS- Mom is living with her and has had some falls with breaks. Having extra stress with that Duration: chronic Status:uncontrolled Anxious mood: yes  Excessive worrying: yes Irritability: yes  Sweating: no Nausea: no Palpitations:no Hyperventilation: no Panic attacks: no Agoraphobia: no  Obscessions/compulsions: no Depressed mood: yes    04/11/2024    8:20 AM 01/04/2024    8:22 AM 10/25/2023    1:54 PM 09/27/2023    4:32 PM 06/28/2023    4:44 PM  Depression screen PHQ 2/9  Decreased Interest 1 0 1 0 1  Down, Depressed, Hopeless 1 0 0 0 0  PHQ - 2 Score 2 0 1 0 1  Altered sleeping 2 3 2  0 3  Tired, decreased energy 2 2 2 3 2   Change in appetite 2 1 0 1 1  Feeling bad or failure about yourself  0 0 0 1 0  Trouble concentrating 1 0 2 0 0  Moving slowly or fidgety/restless 0 1 0 0 0  Suicidal thoughts 0 0 0 0 0  PHQ-9 Score 9 7 7 5 7   Difficult doing  work/chores Very difficult Extremely dIfficult   Somewhat difficult   Anhedonia: no Weight changes: no Insomnia: no   Hypersomnia: no Fatigue/loss of energy: no Feelings of worthlessness: no Feelings of guilt: no Impaired concentration/indecisiveness: no Suicidal ideations: no  Crying spells: no Recent Stressors/Life Changes: no   Relationship problems: no   Family stress: yes     Financial stress: no    Job stress: no    Recent death/loss: yes   Relevant past medical, surgical, family and social history reviewed and updated as indicated. Interim medical history since our last visit reviewed. Allergies and medications reviewed and updated.  Review of Systems  Constitutional: Negative.   Respiratory: Negative.    Cardiovascular: Negative.   Musculoskeletal: Negative.        R great toe pain  Skin: Negative.   Psychiatric/Behavioral:  Positive for dysphoric mood. Negative for agitation, behavioral problems, confusion, decreased concentration, hallucinations, self-injury, sleep disturbance and suicidal ideas. The patient is nervous/anxious. The patient is not hyperactive.     Per HPI unless specifically indicated above     Objective:    BP 122/78 (BP Location: Left Arm, Patient Position: Sitting, Cuff Size: Normal)   Pulse 92  Temp 98.2 F (36.8 C) (Oral)   Resp 16   Ht 5' 7.01 (1.702 m)   Wt 201 lb 12.8 oz (91.5 kg)   LMP 03/24/2022 (Approximate)   SpO2 99%   BMI 31.60 kg/m   Wt Readings from Last 3 Encounters:  04/11/24 201 lb 12.8 oz (91.5 kg)  01/04/24 197 lb 6.4 oz (89.5 kg)  10/25/23 204 lb 6.4 oz (92.7 kg)    Physical Exam Vitals and nursing note reviewed.  Constitutional:      General: She is not in acute distress.    Appearance: Normal appearance. She is obese. She is not ill-appearing, toxic-appearing or diaphoretic.  HENT:     Head: Normocephalic and atraumatic.     Right Ear: External ear normal.     Left Ear: External ear normal.     Nose:  Nose normal.     Mouth/Throat:     Mouth: Mucous membranes are moist.     Pharynx: Oropharynx is clear.  Eyes:     General: No scleral icterus.       Right eye: No discharge.        Left eye: No discharge.     Extraocular Movements: Extraocular movements intact.     Conjunctiva/sclera: Conjunctivae normal.     Pupils: Pupils are equal, round, and reactive to light.  Cardiovascular:     Rate and Rhythm: Normal rate and regular rhythm.     Pulses: Normal pulses.     Heart sounds: Normal heart sounds. No murmur heard.    No friction rub. No gallop.  Pulmonary:     Effort: Pulmonary effort is normal. No respiratory distress.     Breath sounds: Normal breath sounds. No stridor. No wheezing, rhonchi or rales.  Chest:     Chest wall: No tenderness.  Musculoskeletal:        General: Normal range of motion.     Cervical back: Normal range of motion and neck supple.  Skin:    General: Skin is warm and dry.     Capillary Refill: Capillary refill takes less than 2 seconds.     Coloration: Skin is not jaundiced or pale.     Findings: No bruising, erythema, lesion or rash.  Neurological:     General: No focal deficit present.     Mental Status: She is alert and oriented to person, place, and time. Mental status is at baseline.  Psychiatric:        Mood and Affect: Mood normal.        Behavior: Behavior normal.        Thought Content: Thought content normal.        Judgment: Judgment normal.     Results for orders placed or performed in visit on 01/04/24  Bayer DCA Hb A1c Waived (STAT)   Collection Time: 01/04/24  8:27 AM  Result Value Ref Range   HB A1C (BAYER DCA - WAIVED) 6.4 (H) 4.8 - 5.6 %      Assessment & Plan:   Problem List Items Addressed This Visit       Endocrine   Controlled type 2 diabetes mellitus without complication, without long-term current use of insulin (HCC) - Primary   Up slightly with A1c of 6.9 due to anxiety. Will continue current regimen. Continue to  monitor. Call with any concerns. Continue to monitor.       Relevant Orders   Bayer DCA Hb A1c Waived   CBC with Differential/Platelet   Comprehensive  metabolic panel with GFR   Lipid Panel w/o Chol/HDL Ratio     Other   Anxiety   Will increase her celexa  to 40mg  and recheck in 3 months. Call with any concerns.       Relevant Medications   citalopram  (CELEXA ) 20 MG tablet   Other Visit Diagnoses       Injury of toe on right foot, initial encounter       Looks good. No infection. Continue to monitor.     Hepatitis B vaccination status unknown       Will check labs today. Await results. Treat as needed.   Relevant Orders   Hepatitis B surface antibody,quantitative        Follow up plan: Return in about 3 months (around 07/12/2024).

## 2024-04-11 NOTE — Assessment & Plan Note (Signed)
 Will increase her celexa  to 40mg  and recheck in 3 months. Call with any concerns.

## 2024-04-11 NOTE — Assessment & Plan Note (Signed)
 Up slightly with A1c of 6.9 due to anxiety. Will continue current regimen. Continue to monitor. Call with any concerns. Continue to monitor.

## 2024-04-12 LAB — LIPID PANEL W/O CHOL/HDL RATIO

## 2024-04-13 LAB — LIPID PANEL W/O CHOL/HDL RATIO
Cholesterol, Total: 196 mg/dL (ref 100–199)
HDL: 34 mg/dL — AB (ref >39)
LDL Chol Calc (NIH): 138 mg/dL — AB (ref 0–99)
Triglycerides: 131 mg/dL (ref 0–149)
VLDL Cholesterol Cal: 24 mg/dL (ref 5–40)

## 2024-04-13 LAB — CBC WITH DIFFERENTIAL/PLATELET
Basophils Absolute: 0.1 x10E3/uL (ref 0.0–0.2)
Basos: 1 %
EOS (ABSOLUTE): 0.1 x10E3/uL (ref 0.0–0.4)
Eos: 2 %
Hematocrit: 37.9 % (ref 34.0–46.6)
Hemoglobin: 11.7 g/dL (ref 11.1–15.9)
Immature Grans (Abs): 0 x10E3/uL (ref 0.0–0.1)
Immature Granulocytes: 0 %
Lymphocytes Absolute: 2.5 x10E3/uL (ref 0.7–3.1)
Lymphs: 30 %
MCH: 23.3 pg — ABNORMAL LOW (ref 26.6–33.0)
MCHC: 30.9 g/dL — ABNORMAL LOW (ref 31.5–35.7)
MCV: 76 fL — ABNORMAL LOW (ref 79–97)
Monocytes Absolute: 0.6 x10E3/uL (ref 0.1–0.9)
Monocytes: 7 %
Neutrophils Absolute: 4.9 x10E3/uL (ref 1.4–7.0)
Neutrophils: 60 %
Platelets: 520 x10E3/uL — ABNORMAL HIGH (ref 150–450)
RBC: 5.02 x10E6/uL (ref 3.77–5.28)
RDW: 15.6 % — ABNORMAL HIGH (ref 11.7–15.4)
WBC: 8.2 x10E3/uL (ref 3.4–10.8)

## 2024-04-13 LAB — COMPREHENSIVE METABOLIC PANEL WITH GFR
ALT: 19 IU/L (ref 0–32)
AST: 20 IU/L (ref 0–40)
Albumin: 4.4 g/dL (ref 3.9–4.9)
Alkaline Phosphatase: 129 IU/L — AB (ref 44–121)
BUN/Creatinine Ratio: 10 (ref 9–23)
BUN: 8 mg/dL (ref 6–24)
Bilirubin Total: 0.4 mg/dL (ref 0.0–1.2)
CO2: 18 mmol/L — AB (ref 20–29)
Calcium: 9.6 mg/dL (ref 8.7–10.2)
Chloride: 103 mmol/L (ref 96–106)
Creatinine, Ser: 0.82 mg/dL (ref 0.57–1.00)
Globulin, Total: 2.6 g/dL (ref 1.5–4.5)
Glucose: 128 mg/dL — AB (ref 70–99)
Potassium: 5.1 mmol/L (ref 3.5–5.2)
Sodium: 138 mmol/L (ref 134–144)
Total Protein: 7 g/dL (ref 6.0–8.5)
eGFR: 92 mL/min/1.73 (ref >59)

## 2024-04-13 LAB — HEPATITIS B SURFACE ANTIBODY, QUANTITATIVE: Hepatitis B Surf Ab Quant: <3.5 m[IU]/mL — ABNORMAL LOW

## 2024-04-19 ENCOUNTER — Ambulatory Visit: Payer: Self-pay | Admitting: Family Medicine

## 2024-05-08 ENCOUNTER — Ambulatory Visit
Admission: EM | Admit: 2024-05-08 | Discharge: 2024-05-08 | Disposition: A | Discharge status: Home / Self Care | Attending: Emergency Medicine | Admitting: Emergency Medicine

## 2024-05-08 ENCOUNTER — Ambulatory Visit: Payer: Self-pay

## 2024-05-08 DIAGNOSIS — R030 Elevated blood-pressure reading, without diagnosis of hypertension: Secondary | ICD-10-CM

## 2024-05-08 DIAGNOSIS — J069 Acute upper respiratory infection, unspecified: Secondary | ICD-10-CM

## 2024-05-08 DIAGNOSIS — B349 Viral infection, unspecified: Secondary | ICD-10-CM | POA: Diagnosis present

## 2024-05-08 LAB — RESP PANEL BY RT-PCR (FLU A&B, COVID) ARPGX2
Influenza A by PCR: NEGATIVE
Influenza B by PCR: NEGATIVE
SARS Coronavirus 2 by RT PCR: NEGATIVE

## 2024-05-08 LAB — GROUP A STREP BY PCR: Group A Strep by PCR: NOT DETECTED

## 2024-05-08 MED ORDER — BENZONATATE 100 MG PO CAPS: 100.0000 mg | ORAL_CAPSULE | Freq: Three times a day (TID) | ORAL | 0 refills | Status: DC

## 2024-05-08 NOTE — Telephone Encounter (Signed)
 FYI Only or Action Required?: FYI only for provider.  Patient was last seen in primary care on 04/11/2024 by Vicci Duwaine SQUIBB, DO.  Called Nurse Triage reporting Sore Throat.  Symptoms began several days ago.  Interventions attempted: Rest, hydration, or home remedies.  Symptoms are: unchanged.  Triage Disposition: See Physician Within 24 Hours  Patient/caregiver understands and will follow disposition?: YesCopied from CRM #[id number]. Topic: Clinical - Red Word Triage >> May 08, 2024  8:45 AM Avram MATSU wrote: Red Word that prompted transfer to Nurse Triage: cold, coughing, pain in ear, headache, sore throat, can't smell. Reason for Disposition  Earache also present  Answer Assessment - Initial Assessment Questions Started Friday feeling run down. Pt had headache, bilateral ear pain, chills, cough then sore throat started. Pt has been nausea at times. Coughing makes throat hurt worse. No office appts. RN advised UC, Pt stated she will go.      1. ONSET: When did the throat start hurting? (Hours or days ago)      Friday night 2. SEVERITY: How bad is the sore throat? (Scale 1-10; mild, moderate or severe)     2 3. STREP EXPOSURE: Has there been any exposure to strep within the past week? If Yes, ask: What type of contact occurred?      Not sure 4.  VIRAL SYMPTOMS: Are there any symptoms of a cold, such as a runny nose, cough, hoarse voice or red eyes?      hoarse 5. FEVER: Do you have a fever? If Yes, ask: What is your temperature, how was it measured, and when did it start?     Has chills  6. PUS ON THE TONSILS: Is there pus on the tonsils in the back of your throat?     denies 7. OTHER SYMPTOMS: Do you have any other symptoms? (e.g., difficulty breathing, headache, rash)     Headache, loss of smell  Protocols used: Sore Throat-A-AH

## 2024-05-08 NOTE — Discharge Instructions (Addendum)
 Your covid,flu, strep are all negative. Most likely you have a viral illness: no antibiotic is indicated at this time, May treat with OTC meds of choice(allergy med of choice, fluticasone  nasal spray , chloraseptic throat lozenges. May use tessalon  as prescribed . Make sure to drink plenty of fluids to stay hydrated(pedialyte, gatorade, water, popsicles,jello,etc), avoid caffeine  products. Follow up with PCP.

## 2024-05-08 NOTE — ED Provider Notes (Signed)
 [facility]    CSN: [id number] Arrival date & time: 05/08/24  1012      History   Chief Complaint Chief Complaint  Patient presents with   Sore Throat   Cough   Fever   Nausea   Headache    HPI Ashley Kidd is a 43 y.o. female.   43 year old female, Ashley Kidd, presents to urgent care for evaluation of sore throat ,cough ,fever, nausea, headache x 2.5 days. Pt states she recently went to [place], unknown illness exposure. Pt is taking otc Mucinex for symptoms management.   Pt reports BS in 130's today.  The history is provided by the patient. No language interpreter was used.    Past Medical History:  Diagnosis Date   Allergy    Asthma    C. difficile diarrhea 12/24/2015   Chronic cholecystitis without calculus 05/19/2013   GERD (gastroesophageal reflux disease)    Migraine    PCOS (polycystic ovarian syndrome)    PCOS (polycystic ovarian syndrome)    Sinus complaint    Type 2 diabetes mellitus without complication, without long-term current use of insulin (HCC) 07/26/2019    Patient Active Problem List   Diagnosis Date Noted   Nonspecific syndrome suggestive of viral illness 05/08/2024   Viral URI with cough 05/08/2024   Elevated blood pressure reading 05/08/2024   Anxiety 07/21/2022   Chronic pain of right ankle 02/10/2022   Arthritis of knee 11/04/2021   Controlled type 2 diabetes mellitus without complication, without long-term current use of insulin (HCC) 07/26/2019   Moderate persistent asthma without complication 07/18/2019   Migraine    Allergy     Past Surgical History:  Procedure Laterality Date   CHOLECYSTECTOMY  06-09-13   OVARIAN CYST REMOVAL Bilateral 09/28/2000   TOTAL ABDOMINAL HYSTERECTOMY  10/16/2022   UPPER GI ENDOSCOPY  11/29/2012   Dr Ora    OB History     Gravida  0   Para  0   Term  0   Preterm  0   AB  0   Living  0      SAB  0   IAB  0   Ectopic  0   Multiple  0   Live Births            Obstetric Comments  1st Menstrual Cycle: 13 1st Pregnancy:  NA          Home Medications    Prior to Admission medications   Medication Sig Start Date End Date Taking? Authorizing Provider  aspirin 81 MG tablet Take 81 mg by mouth daily.   Yes [provider]  benzonatate  (TESSALON ) 100 MG capsule Take 1 capsule (100 mg total) by mouth every 8 (eight) hours. 05/08/24  Yes Tiran Sauseda, NP  citalopram  (CELEXA ) 20 MG tablet Take 2 tablets (40 mg total) by mouth daily. 04/11/24  Yes Johnson, Megan P, DO  fluticasone  (FLONASE ) 50 MCG/ACT nasal spray INSTILL 2 SPRAYS IN EACH NOSTRIL EVERY DAY 01/20/23  Yes Johnson, Megan P, DO  levocetirizine (XYZAL) 5 MG tablet 1 tablet in the evening Orally Once a day for 30 days 10/15/22  Yes [provider]  montelukast  (SINGULAIR ) 10 MG tablet TAKE 1 TABLET(10 MG) BY MOUTH AT BEDTIME 01/04/24  Yes Johnson, Megan P, DO  omeprazole  (PRILOSEC) 40 MG capsule TAKE 1 CAPSULE(40 MG) BY MOUTH DAILY 01/04/24  Yes Johnson, Megan P, DO  Semaglutide  (RYBELSUS ) 14 MG TABS Take 1 tablet (14 mg total) by  mouth daily. 09/27/23  Yes Johnson, Megan P, DO  acetaminophen  (TYLENOL ) 325 MG tablet Take by mouth. Patient not taking: Reported on 04/11/2024 10/16/22   [provider]  albuterol  (VENTOLIN  HFA) 108 (90 Base) MCG/ACT inhaler INHALE 2 PUFFS INTO THE LUNGS EVERY 6 HOURS AS NEEDED FOR WHEEZING 01/04/24   Johnson, Megan P, DO  Albuterol -Budesonide  (AIRSUPRA ) 90-80 MCG/ACT AERO Inhale 2 puffs into the lungs every 4 (four) hours. 01/04/24   Johnson, Megan P, DO  azelastine  (ASTELIN ) 0.1 % nasal spray Place 1 spray into both nostrils 2 (two) times daily. Use in each nostril as directed 12/31/22   Lowella Folks L, PA  azelastine  (OPTIVAR ) 0.05 % ophthalmic solution 1 drop into affected eye Ophthalmic Twice a day for 30 days 10/15/22   [provider]  beclomethasone (QVAR  REDIHALER) 80 MCG/ACT inhaler Inhale 1 puff into the lungs 2 (two)  times daily. 01/04/24   Johnson, Megan P, DO  cetirizine (ZYRTEC) 10 MG tablet Take 10 mg by mouth daily.    [provider]  EPINEPHrine  0.3 mg/0.3 mL IJ SOAJ injection INJ 0.3 ML IM ONCE FOR 1 DOSE 01/19/22   Vicci Bouchard P, DO  GALLIFREY  5 MG tablet TAKE 1 TABLET(5 MG) BY MOUTH DAILY 04/06/24   Melvin Pao, NP  mometasone (ELOCON) 0.1 % cream 1 application Externally Once a day for 90 days 10/15/22   [provider]  mupirocin  ointment (BACTROBAN ) 2 % Apply 1 Application topically 2 (two) times daily. 06/28/23   Vicci Bouchard P, DO  Probiotic Product (PROBIOTIC DAILY PO) Take 1 capsule by mouth daily.    [provider]  promethazine  (PHENERGAN ) 25 MG tablet Take 1 tablet (25 mg total) by mouth every 6 (six) hours as needed for nausea or vomiting. 01/19/22   Vicci Bouchard P, DO  SUMAtriptan  (IMITREX ) 100 MG tablet Take 1 tablet (100 mg total) by mouth every 2 (two) hours as needed for migraine. One tab at onset and may repeat x1 in 1 hour. Max 200 mg/24 hours 01/04/24   Johnson, Megan P, DO  tiotropium (SPIRIVA  HANDIHALER) 18 MCG inhalation capsule Place 1 capsule (18 mcg total) into inhaler and inhale daily. 01/04/24   Johnson, Megan P, DO  triamcinolone  ointment (KENALOG ) 0.5 % Apply 1 Application topically 2 (two) times daily. 09/27/23   Johnson, Megan P, DO  ipratropium (ATROVENT ) 0.06 % nasal spray Place 2 sprays into both nostrils 4 (four) times daily as needed for rhinitis. 10/15/20 01/11/21  Cook, Jayce G, DO    Family History Family History  Problem Relation Age of Onset   Diabetes Mother    Asthma Mother    Diabetes Father    Heart attack Father    Heart disease Father    Asthma Sister    Bipolar disorder Sister    Breast cancer Cousin    Asthma Brother    Bipolar disorder Brother    Epilepsy Brother     Social History Social History   Tobacco Use   Smoking status: Former    Current packs/day: 0.00    Types: Cigarettes    Start date:  08/24/2000    Quit date: 08/24/2012    Years since quitting: 11.7   Smokeless tobacco: Never  Vaping Use   Vaping status: Never Used  Substance Use Topics   Alcohol use: No    Alcohol/week: 0.0 standard drinks of alcohol   Drug use: No     Allergies   Hydrocodone , Tussionex pennkinetic er [employer]  poli-chlorphe poli er], Bee venom, Black walnut pollen allergy skin test, Dust mite extract, and Metformin and related   Review of Systems Review of Systems  Constitutional:  Positive for fever.  HENT:  Positive for ear pain and sore throat.   Respiratory:  Positive for cough.   Gastrointestinal:  Positive for nausea.  Neurological:  Positive for headaches.  All other systems reviewed and are negative.    Physical Exam Triage Vital Signs ED Triage Vitals  Encounter Vitals Group     BP 05/08/24 1034 (!) 155/91     Girls Systolic BP Percentile --      Girls Diastolic BP Percentile --      Boys Systolic BP Percentile --      Boys Diastolic BP Percentile --      Pulse Rate 05/08/24 1034 (!) 103     Resp 05/08/24 1034 19     Temp 05/08/24 1034 98.7 F (37.1 C)     Temp Source 05/08/24 1034 Oral     SpO2 05/08/24 1034 95 %     Weight 05/08/24 1033 200 lb (90.7 kg)     Height --      Head Circumference --      Peak Flow --      Pain Score 05/08/24 1033 4     Pain Loc --      Pain Education --      Exclude from Growth Chart --    No data found.  Updated Vital Signs BP (!) 155/91 (BP Location: Right Arm)   Pulse (!) 103   Temp 98.7 F (37.1 C) (Oral)   Resp 19   Wt 200 lb (90.7 kg)   LMP 03/24/2022 (Approximate)   SpO2 95%   BMI 31.32 kg/m   Visual Acuity Right Eye Distance:   Left Eye Distance:   Bilateral Distance:    Right Eye Near:   Left Eye Near:    Bilateral Near:     Physical Exam Vitals and nursing note reviewed.  Constitutional:      General: She is not in acute distress.    Appearance: She is well-developed and well-groomed.  HENT:      Head: Normocephalic and atraumatic.     Right Ear: Tympanic membrane is retracted.     Left Ear: Tympanic membrane is retracted.     Nose: Mucosal edema and congestion present.     Right Sinus: No maxillary sinus tenderness or frontal sinus tenderness.     Left Sinus: No maxillary sinus tenderness or frontal sinus tenderness.     Mouth/Throat:     Lips: Pink.     Mouth: Mucous membranes are moist.     Pharynx: Oropharynx is clear. Uvula midline.  Eyes:     Conjunctiva/sclera: Conjunctivae normal.  Cardiovascular:     Rate and Rhythm: Normal rate and regular rhythm.     Heart sounds: Normal heart sounds. No murmur heard. Pulmonary:     Effort: Pulmonary effort is normal. No respiratory distress.     Breath sounds: Normal breath sounds and air entry.  Abdominal:     Palpations: Abdomen is soft.     Tenderness: There is no abdominal tenderness.  Musculoskeletal:        General: No swelling.     Cervical back: Neck supple.  Skin:    General: Skin is warm and dry.     Capillary Refill: Capillary refill takes less than 2 seconds.  Neurological:     General:  No focal deficit present.     Mental Status: She is alert and oriented to person, place, and time.     GCS: GCS eye subscore is 4. GCS verbal subscore is 5. GCS motor subscore is 6.  Psychiatric:        Attention and Perception: Attention normal.        Mood and Affect: Mood normal.        Speech: Speech normal.        Behavior: Behavior normal. Behavior is cooperative.      UC Treatments / Results  Labs (all labs ordered are listed, but only abnormal results are displayed) Labs Reviewed  RESP PANEL BY RT-PCR (FLU A&B, COVID) ARPGX2  GROUP A STREP BY PCR    EKG   Radiology No results found.  Procedures Procedures (including critical care time)  Medications Ordered in UC Medications - No data to display  Initial Impression / Assessment and Plan / UC Course  I have reviewed the triage vital signs and the  nursing notes.  Pertinent labs & imaging results that were available during my care of the patient were reviewed by me and considered in my medical decision making (see chart for details).  Clinical Course as of 05/08/24 1138  Mon May 08, 2024  1121 COVID flu and strep are all negative, most likely this is a viral illness, treat with over-the-counter meds for symptom management, push fluids. [JD]    Clinical Course User Index [JD] Maxximus Gotay, Rilla, NP   Discussed exam findings and plan of care with patient, tessalon  scripted, strict go to ER precautions given.   Patient verbalized understanding to this provider.  Ddx: Viral illness, pharyngitis, elevated blood pressure Final Clinical Impressions(s) / UC Diagnoses   Final diagnoses:  Nonspecific syndrome suggestive of viral illness  Viral URI with cough  Elevated blood pressure reading     Discharge Instructions      Your covid,flu, strep are all negative. Most likely you have a viral illness: no antibiotic is indicated at this time, May treat with OTC meds of choice(allergy med of choice, fluticasone  nasal spray , chloraseptic throat lozenges. May use tessalon  as prescribed . Make sure to drink plenty of fluids to stay hydrated(pedialyte, gatorade, water, popsicles,jello,etc), avoid caffeine  products. Follow up with PCP.      ED Prescriptions     Medication Sig Dispense Auth. Provider   benzonatate  (TESSALON ) 100 MG capsule Take 1 capsule (100 mg total) by mouth every 8 (eight) hours. 21 capsule Hailly Fess, NP      PDMP not reviewed this encounter.   Aminta Rilla, NP 05/08/24 1138

## 2024-05-08 NOTE — ED Triage Notes (Signed)
 Sx 2.5 days  Dr told her to come get tested for strep-covid and flu  Headache Sorethroat Bilateral ear pain Fever Nasuea  Cough  Took mucinex  at 8 am

## 2024-05-09 ENCOUNTER — Ambulatory Visit: Payer: Self-pay

## 2024-06-15 ENCOUNTER — Encounter: Payer: Self-pay | Admitting: Family Medicine

## 2024-06-15 MED ORDER — RYBELSUS 14 MG PO TABS: 14.0000 mg | ORAL_TABLET | Freq: Every day | ORAL | 1 refills | Status: DC

## 2024-07-14 ENCOUNTER — Ambulatory Visit: Admitting: Family Medicine

## 2024-07-28 ENCOUNTER — Other Ambulatory Visit: Payer: Self-pay | Admitting: Family Medicine

## 2024-07-29 NOTE — Telephone Encounter (Signed)
 Requested Prescriptions  Pending Prescriptions Disp Refills   omeprazole  (PRILOSEC) 40 MG capsule [Pharmacy Med Name: OMEPRAZOLE  40MG  CAPSULES] 90 capsule 0    Sig: TAKE 1 CAPSULE(40 MG) BY MOUTH DAILY     Gastroenterology: Proton Pump Inhibitors Passed - 07/29/2024  9:12 AM      Passed - Valid encounter within last 12 months    Recent Outpatient Visits           3 months ago Controlled type 2 diabetes mellitus without complication, without long-term current use of insulin (HCC)   [place] [facility] [place], Megan P, DO   6 months ago Controlled type 2 diabetes mellitus without complication, without long-term current use of insulin (HCC)   [place] [facility] [place], Megan P, DO               citalopram  (CELEXA ) 20 MG tablet [Pharmacy Med Name: CITALOPRAM  20MG  TABLETS] 90 tablet 0    Sig: TAKE 1 TABLET(20 MG) BY MOUTH DAILY     Psychiatry:  Antidepressants - SSRI Passed - 07/29/2024  9:12 AM      Passed - Valid encounter within last 6 months    Recent Outpatient Visits           3 months ago Controlled type 2 diabetes mellitus without complication, without long-term current use of insulin (HCC)   [place] [facility] [place], Megan P, DO   6 months ago Controlled type 2 diabetes mellitus without complication, without long-term current use of insulin [facility])   [place] [facility] [place], [place], DO

## 2024-07-31 ENCOUNTER — Encounter: Payer: Self-pay | Admitting: Radiology

## 2024-08-05 ENCOUNTER — Other Ambulatory Visit: Payer: Self-pay | Admitting: Family Medicine

## 2024-08-07 NOTE — Telephone Encounter (Signed)
 Requested Prescriptions  Pending Prescriptions Disp Refills   montelukast  (SINGULAIR ) 10 MG tablet [Pharmacy Med Name: MONTELUKAST  10MG  TABLETS] 90 tablet 0    Sig: TAKE 1 TABLET(10 MG) BY MOUTH AT BEDTIME     Pulmonology:  Leukotriene Inhibitors Passed - 08/07/2024  2:09 PM      Passed - Valid encounter within last 12 months    Recent Outpatient Visits           3 months ago Controlled type 2 diabetes mellitus without complication, without long-term current use of insulin (HCC)   [place] [facility], Megan P, DO   7 months ago Controlled type 2 diabetes mellitus without complication, without long-term current use of insulin [facility])   [place] [facility] [place], [place], DO

## 2024-08-18 ENCOUNTER — Ambulatory Visit: Admitting: Family Medicine

## 2024-08-18 ENCOUNTER — Encounter: Payer: Self-pay | Admitting: Family Medicine

## 2024-08-18 VITALS — BP 156/90 | HR 96 | Temp 98.0°F | Ht 67.0 in | Wt 208.0 lb

## 2024-08-18 DIAGNOSIS — E119 Type 2 diabetes mellitus without complications: Secondary | ICD-10-CM

## 2024-08-18 DIAGNOSIS — R0683 Snoring: Secondary | ICD-10-CM | POA: Diagnosis not present

## 2024-08-18 DIAGNOSIS — Z7984 Long term (current) use of oral hypoglycemic drugs: Secondary | ICD-10-CM | POA: Diagnosis not present

## 2024-08-18 DIAGNOSIS — I10 Essential (primary) hypertension: Secondary | ICD-10-CM | POA: Insufficient documentation

## 2024-08-18 MED ORDER — LOSARTAN POTASSIUM 25 MG PO TABS: 25.0000 mg | ORAL_TABLET | Freq: Every day | ORAL | 0 refills | Status: DC

## 2024-08-18 NOTE — Assessment & Plan Note (Signed)
 Up slightly with A1c of 7.2. Will work on diet and exercise and recheck. Consider changing to monjaro if we can get it covered.

## 2024-08-18 NOTE — Progress Notes (Signed)
 BP (!) 156/90   Pulse 96   Temp 98 F (36.7 C) (Oral)   Ht 5' 7 (1.702 m)   Wt 208 lb (94.3 kg)   LMP 03/24/2022 (Approximate)   SpO2 99%   BMI 32.58 kg/m    Subjective:    Patient ID: Ashley Kidd, female    DOB: 07/27/1981, 43 y.o.   MRN: [id number]  HPI: Ashley Kidd is a 43 y.o. female  Chief Complaint  Patient presents with   Diabetes    BGL @ 130-145 fasting in mornings   DIABETES Hypoglycemic episodes:no Polydipsia/polyuria: no Visual disturbance: no Chest pain: no Paresthesias: no Glucose Monitoring: no  Accucheck frequency: Not Checking Taking Insulin?: no Blood Pressure Monitoring: not checking Retinal Examination: Up to Date Foot Exam: Up to Date Diabetic Education: Completed Pneumovax: Up to Date Influenza: Not up to Date Aspirin: no  HYPERTENSION  Hypertension status: uncontrolled  Satisfied with current treatment? no Duration of hypertension: months BP monitoring frequency:  not checking BP medication side effects:  no Medication compliance: excellent compliance Previous BP meds: none Aspirin: no Recurrent headaches: no Visual changes: no Palpitations: no Dyspnea: no Chest pain: no Lower extremity edema: no Dizzy/lightheaded: no  ???SLEEP APNEA Sleep apnea status: unknown Duration: months Satisfied with current treatment?:  no CPAP use:  no Last sleep study: never Treatments attempted: none Wakes feeling refreshed:  no Daytime hypersomnolence:  yes Fatigue:  yes Insomnia:  yes Good sleep hygiene:  yes Difficulty falling asleep:  yes Difficulty staying asleep:  no Snoring bothers bed partner:  yes Observed apnea by bed partner: yes Obesity:  yes Hypertension: yes  Pulmonary hypertension:  no Coronary artery disease:  no   Relevant past medical, surgical, family and social history reviewed and updated as indicated. Interim medical history since our last visit reviewed. Allergies and medications reviewed and  updated.  Review of Systems  Constitutional: Negative.   Respiratory: Negative.    Cardiovascular: Negative.   Musculoskeletal: Negative.   Neurological: Negative.   Psychiatric/Behavioral:  Positive for dysphoric mood. Negative for agitation, behavioral problems, confusion, decreased concentration, hallucinations, self-injury, sleep disturbance and suicidal ideas. The patient is nervous/anxious. The patient is not hyperactive.     Per HPI unless specifically indicated above     Objective:    BP (!) 156/90   Pulse 96   Temp 98 F (36.7 C) (Oral)   Ht 5' 7 (1.702 m)   Wt 208 lb (94.3 kg)   LMP 03/24/2022 (Approximate)   SpO2 99%   BMI 32.58 kg/m   Wt Readings from Last 3 Encounters:  08/18/24 208 lb (94.3 kg)  05/08/24 200 lb (90.7 kg)  04/11/24 201 lb 12.8 oz (91.5 kg)    Physical Exam Vitals and nursing note reviewed.  Constitutional:      General: She is not in acute distress.    Appearance: Normal appearance. She is not ill-appearing, toxic-appearing or diaphoretic.  HENT:     Head: Normocephalic and atraumatic.     Right Ear: External ear normal.     Left Ear: External ear normal.     Nose: Nose normal.     Mouth/Throat:     Mouth: Mucous membranes are moist.     Pharynx: Oropharynx is clear.  Eyes:     General: No scleral icterus.       Right eye: No discharge.        Left eye: No discharge.     Extraocular Movements: Extraocular movements  intact.     Conjunctiva/sclera: Conjunctivae normal.     Pupils: Pupils are equal, round, and reactive to light.  Cardiovascular:     Rate and Rhythm: Normal rate and regular rhythm.     Pulses: Normal pulses.     Heart sounds: Normal heart sounds. No murmur heard.    No friction rub. No gallop.  Pulmonary:     Effort: Pulmonary effort is normal. No respiratory distress.     Breath sounds: Normal breath sounds. No stridor. No wheezing, rhonchi or rales.  Chest:     Chest wall: No tenderness.  Musculoskeletal:         General: Normal range of motion.     Cervical back: Normal range of motion and neck supple.  Skin:    General: Skin is warm and dry.     Capillary Refill: Capillary refill takes less than 2 seconds.     Coloration: Skin is not jaundiced or pale.     Findings: No bruising, erythema, lesion or rash.  Neurological:     General: No focal deficit present.     Mental Status: She is alert and oriented to person, place, and time. Mental status is at baseline.  Psychiatric:        Mood and Affect: Mood normal.        Behavior: Behavior normal.        Thought Content: Thought content normal.        Judgment: Judgment normal.     Results for orders placed or performed during the hospital encounter of 05/08/24  Resp Panel by RT-PCR (Flu A&B, Covid) Anterior Nasal Swab   Collection Time: 05/08/24 10:37 AM   Specimen: Anterior Nasal Swab  Result Value Ref Range   SARS Coronavirus 2 by RT PCR NEGATIVE NEGATIVE   Influenza A by PCR NEGATIVE NEGATIVE   Influenza B by PCR NEGATIVE NEGATIVE  Group A Strep by PCR   Collection Time: 05/08/24 10:37 AM   Specimen: Throat; Sterile Swab  Result Value Ref Range   Group A Strep by PCR NOT DETECTED NOT DETECTED      Assessment & Plan:   Problem List Items Addressed This Visit       Cardiovascular and Mediastinum   Primary hypertension   Will start losartan  and recheck in 6 weeks. Call with any concerns.       Relevant Medications   losartan  (COZAAR ) 25 MG tablet     Endocrine   Controlled type 2 diabetes mellitus without complication, without long-term current use of insulin (HCC) - Primary   Up slightly with A1c of 7.2. Will work on diet and exercise and recheck. Consider changing to monjaro if we can get it covered.      Relevant Medications   losartan  (COZAAR ) 25 MG tablet   Other Relevant Orders   Bayer DCA Hb A1c Waived (STAT)   Other Visit Diagnoses       Snoring       Will check sleep study. Await results. Treat as needed.    Relevant Orders   Ambulatory referral to Sleep Studies        Follow up plan: Return in about 6 weeks (around 09/29/2024) for physical.

## 2024-08-18 NOTE — Assessment & Plan Note (Signed)
 Will start losartan  and recheck in 6 weeks. Call with any concerns.

## 2024-08-21 LAB — BAYER DCA HB A1C WAIVED: HB A1C (BAYER DCA - WAIVED): 7.2 % — ABNORMAL HIGH (ref 4.8–5.6)

## 2024-08-25 ENCOUNTER — Telehealth: Admitting: Family Medicine

## 2024-08-25 DIAGNOSIS — B9689 Other specified bacterial agents as the cause of diseases classified elsewhere: Secondary | ICD-10-CM

## 2024-08-25 MED ORDER — AMOXICILLIN-POT CLAVULANATE 875-125 MG PO TABS: 1.0000 | ORAL_TABLET | Freq: Two times a day (BID) | ORAL | 0 refills | Status: AC

## 2024-08-25 NOTE — Progress Notes (Unsigned)

## 2024-08-30 ENCOUNTER — Other Ambulatory Visit: Payer: Self-pay | Admitting: Family Medicine

## 2024-08-30 MED ORDER — CLOBETASOL PROPIONATE 0.05 % EX SOLN: Freq: Two times a day (BID) | CUTANEOUS | 3 refills | Status: AC

## 2024-09-18 ENCOUNTER — Encounter: Payer: Self-pay | Admitting: Family Medicine

## 2024-09-18 DIAGNOSIS — G4733 Obstructive sleep apnea (adult) (pediatric): Secondary | ICD-10-CM | POA: Insufficient documentation

## 2024-09-20 ENCOUNTER — Encounter: Payer: Self-pay | Admitting: Family Medicine

## 2024-09-20 ENCOUNTER — Ambulatory Visit: Admitting: Family Medicine

## 2024-09-20 VITALS — BP 158/76 | HR 103 | Temp 98.3°F | Ht 67.0 in | Wt 206.2 lb

## 2024-09-20 DIAGNOSIS — J4541 Moderate persistent asthma with (acute) exacerbation: Secondary | ICD-10-CM

## 2024-09-20 DIAGNOSIS — R051 Acute cough: Secondary | ICD-10-CM | POA: Diagnosis not present

## 2024-09-20 DIAGNOSIS — J029 Acute pharyngitis, unspecified: Secondary | ICD-10-CM

## 2024-09-20 LAB — POC COVID19 BINAXNOW: SARS Coronavirus 2 Ag: NEGATIVE

## 2024-09-20 MED ORDER — ALBUTEROL SULFATE (2.5 MG/3ML) 0.083% IN NEBU
2.5000 mg | INHALATION_SOLUTION | Freq: Once | RESPIRATORY_TRACT | Status: AC
  Administered 2024-09-20: 2.5 mg via RESPIRATORY_TRACT

## 2024-09-20 MED ORDER — PREDNISONE 10 MG PO TABS: ORAL_TABLET | ORAL | 0 refills | Status: DC

## 2024-09-20 MED ORDER — METHYLPREDNISOLONE SODIUM SUCC 40 MG IJ SOLR
40.0000 mg | Freq: Once | INTRAMUSCULAR | Status: AC
  Administered 2024-09-20: 40 mg via INTRAMUSCULAR

## 2024-09-20 NOTE — Progress Notes (Signed)
 "  BP (!) 158/76   Pulse (!) 103   Temp 98.3 F (36.8 C) (Oral)   Ht 5' 7 (1.702 m)   Wt 206 lb 3.2 oz (93.5 kg)   LMP 03/24/2022   SpO2 98%   BMI 32.30 kg/m    Subjective:    Patient ID: Ashley Kidd, female    DOB: [birth year], 43 y.o.   MRN: [id number]  HPI: Ashley Kidd is a 43 y.o. female  Chief Complaint  Patient presents with   URI    Patient states she has been having off and on symptoms for the last month. States she has been having a cough, congestion, sore throat, headache, chest congestion, and sinus pressure.    UPPER RESPIRATORY TRACT INFECTION Duration: 3-4 weeks, but then was better and started feeling worse about a week ago Worst symptom: SOB, Chills, congestion Fever: no Cough: yes Shortness of breath: yes Wheezing: yes Chest pain: yes, with cough Chest tightness: yes Chest congestion: yes Nasal congestion: yes Runny nose: yes Post nasal drip: no Sneezing: no Sore throat: no Swollen glands: no Sinus pressure: no Headache: yes Face pain: no Toothache: no Ear pain: yes, bilateral R>L  Ear pressure: no  Eyes red/itching:no Eye drainage/crusting: no  Vomiting: no Rash: no Fatigue: yes Sick contacts: yes Strep contacts: no  Context: worse Recurrent sinusitis: no Relief with OTC cold/cough medications: no  Treatments attempted: inhalers, mucinex DM   Relevant past medical, surgical, family and social history reviewed and updated as indicated. Interim medical history since our last visit reviewed. Allergies and medications reviewed and updated.  Review of Systems  Constitutional: Negative.   HENT:  Positive for congestion, postnasal drip, rhinorrhea and sinus pressure. Negative for dental problem, drooling, ear discharge, ear pain, facial swelling, hearing loss, mouth sores, nosebleeds, sinus pain, sneezing, sore throat, tinnitus, trouble swallowing and voice change.   Respiratory:  Positive for cough, chest tightness, shortness of  breath and wheezing. Negative for apnea, choking and stridor.   Cardiovascular: Negative.   Gastrointestinal: Negative.   Musculoskeletal: Negative.   Skin: Negative.   Psychiatric/Behavioral: Negative.      Per HPI unless specifically indicated above     Objective:    BP (!) 158/76   Pulse (!) 103   Temp 98.3 F (36.8 C) (Oral)   Ht 5' 7 (1.702 m)   Wt 206 lb 3.2 oz (93.5 kg)   LMP 03/24/2022   SpO2 98%   BMI 32.30 kg/m   Wt Readings from Last 3 Encounters:  09/20/24 206 lb 3.2 oz (93.5 kg)  08/18/24 208 lb (94.3 kg)  05/08/24 200 lb (90.7 kg)    Physical Exam Vitals and nursing note reviewed.  Constitutional:      General: She is not in acute distress.    Appearance: Normal appearance. She is not ill-appearing, toxic-appearing or diaphoretic.  HENT:     Head: Normocephalic and atraumatic.     Right Ear: Tympanic membrane, ear canal and external ear normal.     Left Ear: Tympanic membrane, ear canal and external ear normal.     Nose: No congestion or rhinorrhea.     Mouth/Throat:     Mouth: Mucous membranes are moist.     Pharynx: Oropharynx is clear. No oropharyngeal exudate or posterior oropharyngeal erythema.  Eyes:     General: No scleral icterus.       Right eye: No discharge.        Left eye: No discharge.  Extraocular Movements: Extraocular movements intact.     Conjunctiva/sclera: Conjunctivae normal.     Pupils: Pupils are equal, round, and reactive to light.  Cardiovascular:     Rate and Rhythm: Normal rate and regular rhythm.     Pulses: Normal pulses.     Heart sounds: Normal heart sounds. No murmur heard.    No friction rub. No gallop.  Pulmonary:     Effort: Pulmonary effort is normal. No respiratory distress.     Breath sounds: No stridor. No wheezing, rhonchi or rales.     Comments: Really tight lung sounds with scattered wheezes Chest:     Chest wall: No tenderness.  Musculoskeletal:        General: Normal range of motion.      Cervical back: Normal range of motion and neck supple.  Skin:    General: Skin is warm and dry.     Capillary Refill: Capillary refill takes less than 2 seconds.     Coloration: Skin is not jaundiced or pale.     Findings: No bruising, erythema, lesion or rash.  Neurological:     General: No focal deficit present.     Mental Status: She is alert and oriented to person, place, and time. Mental status is at baseline.  Psychiatric:        Mood and Affect: Mood normal.        Behavior: Behavior normal.        Thought Content: Thought content normal.        Judgment: Judgment normal.     Results for orders placed or performed in visit on 09/20/24  POC COVID-19   Collection Time: 09/20/24 11:35 AM  Result Value Ref Range   SARS Coronavirus 2 Ag Negative Negative      Assessment & Plan:   Problem List Items Addressed This Visit   None Visit Diagnoses       Moderate persistent asthma with exacerbation    -  Primary   Flu, strep and covid negative. Much better after neb/solumedrol. Will treat with steroids. Call if not getting better or getting worse.   Relevant Medications   methylPREDNISolone  sodium succinate (SOLU-MEDROL ) 40 mg/mL injection 40 mg (Completed)   albuterol  (PROVENTIL ) (2.5 MG/3ML) 0.083% nebulizer solution 2.5 mg (Completed)   predniSONE  (DELTASONE ) 10 MG tablet     Sore throat       Relevant Orders   Influenza A & B (STAT)   POC COVID-19 (Completed)   Rapid Strep screen([facility])     Acute cough       Relevant Orders   Influenza A & B (STAT)   POC COVID-19 (Completed)        Follow up plan: Return for As scheduled.      "

## 2024-09-23 LAB — VERITOR FLU A/B WAIVED
Influenza A: NEGATIVE
Influenza B: NEGATIVE

## 2024-09-23 LAB — CULTURE, GROUP A STREP: Strep A Culture: NEGATIVE

## 2024-09-23 LAB — RAPID STREP SCREEN (MED CTR MEBANE ONLY): Strep Gp A Ag, IA W/Reflex: NEGATIVE

## 2024-09-29 ENCOUNTER — Other Ambulatory Visit: Payer: Self-pay | Admitting: Family Medicine

## 2024-09-29 DIAGNOSIS — Z1231 Encounter for screening mammogram for malignant neoplasm of breast: Secondary | ICD-10-CM

## 2024-09-29 MED ORDER — TIRZEPATIDE 10 MG/0.5ML ~~LOC~~ SOAJ: 10.0000 mg | SUBCUTANEOUS | 0 refills | Status: AC

## 2024-10-02 ENCOUNTER — Telehealth: Payer: Self-pay

## 2024-10-02 ENCOUNTER — Encounter: Payer: Self-pay | Admitting: Family Medicine

## 2024-10-02 ENCOUNTER — Other Ambulatory Visit (HOSPITAL_COMMUNITY): Payer: Self-pay

## 2024-10-02 NOTE — Telephone Encounter (Signed)
 Pharmacy Patient Advocate Encounter   Received notification from Patient Advice Request messages that prior authorization for [employer] inject[employer] is required/requested.   Insurance verification completed.   The patient is insured through [facility].   Per test claim: The current 28 day co-pay is, $35.00.  No PA needed at this time. This test claim was processed through [facility]- copay amounts may vary at other pharmacies due to pharmacy/plan contracts, or as the patient moves through the different stages of their insurance plan.

## 2024-10-13 ENCOUNTER — Ambulatory Visit (INDEPENDENT_AMBULATORY_CARE_PROVIDER_SITE_OTHER): Admitting: Family Medicine

## 2024-10-13 ENCOUNTER — Encounter: Payer: Self-pay | Admitting: Family Medicine

## 2024-10-13 VITALS — BP 134/80 | HR 81 | Temp 98.0°F | Ht 67.0 in | Wt 207.2 lb

## 2024-10-13 DIAGNOSIS — Z789 Other specified health status: Secondary | ICD-10-CM | POA: Diagnosis not present

## 2024-10-13 DIAGNOSIS — Z7985 Long-term (current) use of injectable non-insulin antidiabetic drugs: Secondary | ICD-10-CM | POA: Diagnosis not present

## 2024-10-13 DIAGNOSIS — E119 Type 2 diabetes mellitus without complications: Secondary | ICD-10-CM | POA: Diagnosis not present

## 2024-10-13 DIAGNOSIS — Z0000 Encounter for general adult medical examination without abnormal findings: Secondary | ICD-10-CM

## 2024-10-13 DIAGNOSIS — G4733 Obstructive sleep apnea (adult) (pediatric): Secondary | ICD-10-CM

## 2024-10-13 DIAGNOSIS — I10 Essential (primary) hypertension: Secondary | ICD-10-CM | POA: Diagnosis not present

## 2024-10-13 DIAGNOSIS — M25521 Pain in right elbow: Secondary | ICD-10-CM

## 2024-10-13 DIAGNOSIS — F419 Anxiety disorder, unspecified: Secondary | ICD-10-CM | POA: Diagnosis not present

## 2024-10-13 LAB — MICROALBUMIN, URINE WAIVED
Creatinine, Urine Waived: 10 mg/dL (ref 10–300)
Microalb, Ur Waived: 10 mg/L (ref 0–19)
Microalb/Creat Ratio: <30 mg/g

## 2024-10-13 LAB — BAYER DCA HB A1C WAIVED: HB A1C (BAYER DCA - WAIVED): 6.7 % — ABNORMAL HIGH (ref 4.8–5.6)

## 2024-10-13 MED ORDER — DICLOFENAC SODIUM 1 % EX GEL: 4.0000 g | Freq: Four times a day (QID) | CUTANEOUS | 0 refills | Status: AC

## 2024-10-13 MED ORDER — OMEPRAZOLE 40 MG PO CPDR: DELAYED_RELEASE_CAPSULE | ORAL | 1 refills | Status: AC

## 2024-10-13 MED ORDER — CITALOPRAM HYDROBROMIDE 20 MG PO TABS: 40.0000 mg | ORAL_TABLET | Freq: Every day | ORAL | 1 refills | Status: AC

## 2024-10-13 MED ORDER — LOSARTAN POTASSIUM 25 MG PO TABS: 25.0000 mg | ORAL_TABLET | Freq: Every day | ORAL | 1 refills | Status: AC

## 2024-10-13 MED ORDER — MONTELUKAST SODIUM 10 MG PO TABS: ORAL_TABLET | ORAL | 1 refills | Status: AC

## 2024-10-13 NOTE — Assessment & Plan Note (Signed)
 Under good control on current regimen. Continue current regimen. Continue to monitor. Call with any concerns. Refills given. Labs drawn today.

## 2024-10-13 NOTE — Progress Notes (Signed)
 "  BP 134/80   Pulse 81   Temp 98 F (36.7 C) (Oral)   Ht 5' 7 (1.702 m)   Wt 207 lb 3.2 oz (94 kg)   LMP 03/24/2022   SpO2 98%   BMI 32.45 kg/m    Subjective:    Patient ID: Ashley Kidd, female    DOB: July 29, 1981, 44 y.o.   MRN: [id number]  HPI: Ashley Kidd is a 44 y.o. female presenting on 10/13/2024 for comprehensive medical examination. Current medical complaints include:  DIABETES Hypoglycemic episodes:no Polydipsia/polyuria: no Visual disturbance: no Chest pain: no Paresthesias: no Glucose Monitoring: yes  Accucheck frequency: Daily Taking Insulin?: no Blood Pressure Monitoring: rarely Retinal Examination: Up to Date Foot Exam: Up to Date Diabetic Education: Completed Pneumovax: Up to Date Influenza: Not up to Date Aspirin: no  SLEEP APNEA Sleep apnea status: better Duration: months Satisfied with current treatment?:  yes CPAP use:  yes Sleep quality with CPAP use: excellent Treament compliance:excellent compliance Last sleep study:  Treatments attempted: CPAP Wakes feeling refreshed:  yes Daytime hypersomnolence:  no Fatigue:  no Insomnia:  no Good sleep hygiene:  no Difficulty falling asleep:  no Difficulty staying asleep:  no Snoring bothers bed partner:  no Observed apnea by bed partner: yes Obesity:  yes Hypertension: yes  Pulmonary hypertension:  no Coronary artery disease:  no  HYPERTENSION / HYPERLIPIDEMIA Satisfied with current treatment? yes Duration of hypertension: chronic BP monitoring frequency: not checking BP medication side effects: no Past BP meds: losartan  Duration of hyperlipidemia: chronic Cholesterol medication side effects: no Cholesterol supplements: none Past cholesterol medications: none Medication compliance: excellent compliance Aspirin: no Recent stressors: no Recurrent headaches: no Visual changes: no Palpitations: no Dyspnea: no Chest pain: no Lower extremity edema: no Dizzy/lightheaded:  no  ANXIETY/STRESS Duration: chronic Status:controlled Anxious mood: yes  Excessive worrying: no Irritability: no  Sweating: no Nausea: no Palpitations:no Hyperventilation: no Panic attacks: no Agoraphobia: no  Obscessions/compulsions: no Depressed mood: no    04/11/2024    8:20 AM 01/04/2024    8:22 AM 10/25/2023    1:54 PM 09/27/2023    4:32 PM 06/28/2023    4:44 PM  Depression screen PHQ 2/9  Decreased Interest 1 0 1 0 1  Down, Depressed, Hopeless 1 0 0 0 0  PHQ - 2 Score 2 0 1 0 1  Altered sleeping 2 3 2  0 3  Tired, decreased energy 2 2 2 3 2   Change in appetite 2 1 0 1 1  Feeling bad or failure about yourself  0 0 0 1 0  Trouble concentrating 1 0 2 0 0  Moving slowly or fidgety/restless 0 1 0 0 0  Suicidal thoughts 0 0 0 0 0  PHQ-9 Score 9  7  7  5  7    Difficult doing work/chores Very difficult Extremely dIfficult   Somewhat difficult     Data saved with a previous flowsheet row definition   Anhedonia: no Weight changes: no Insomnia: no   Hypersomnia: no Fatigue/loss of energy: no Feelings of worthlessness: no Feelings of guilt: no Impaired concentration/indecisiveness: no Suicidal ideations: no  Crying spells: no Recent Stressors/Life Changes: no   Relationship problems: no   Family stress: no     Financial stress: no    Job stress: no    Recent death/loss: no   She currently lives with: husband and kids Menopausal Symptoms: no  Depression Screen done today and results listed below:     04/11/2024  8:20 AM 01/04/2024    8:22 AM 10/25/2023    1:54 PM 09/27/2023    4:32 PM 06/28/2023    4:44 PM  Depression screen PHQ 2/9  Decreased Interest 1 0 1 0 1  Down, Depressed, Hopeless 1 0 0 0 0  PHQ - 2 Score 2 0 1 0 1  Altered sleeping 2 3 2  0 3  Tired, decreased energy 2 2 2 3 2   Change in appetite 2 1 0 1 1  Feeling bad or failure about yourself  0 0 0 1 0  Trouble concentrating 1 0 2 0 0  Moving slowly or fidgety/restless 0 1 0 0 0  Suicidal  thoughts 0 0 0 0 0  PHQ-9 Score 9  7  7  5  7    Difficult doing work/chores Very difficult Extremely dIfficult   Somewhat difficult     Data saved with a previous flowsheet row definition     Past Medical History:  Past Medical History:  Diagnosis Date   Allergy    Asthma    C. difficile diarrhea 12/24/2015   Chronic cholecystitis without calculus 05/19/2013   GERD (gastroesophageal reflux disease)    Migraine    PCOS (polycystic ovarian syndrome)    PCOS (polycystic ovarian syndrome)    Sinus complaint    Type 2 diabetes mellitus without complication, without long-term current use of insulin (HCC) 07/26/2019    Surgical History:  Past Surgical History:  Procedure Laterality Date   CHOLECYSTECTOMY  06-09-13   OVARIAN CYST REMOVAL Bilateral 09/28/2000   TOTAL ABDOMINAL HYSTERECTOMY  10/16/2022   UPPER GI ENDOSCOPY  11/29/2012   Dr Ora    Medications:  Current Outpatient Medications on File Prior to Visit  Medication Sig   albuterol  (VENTOLIN  HFA) 108 (90 Base) MCG/ACT inhaler INHALE 2 PUFFS INTO THE LUNGS EVERY 6 HOURS AS NEEDED FOR WHEEZING   Albuterol -Budesonide  (AIRSUPRA ) 90-80 MCG/ACT AERO Inhale 2 puffs into the lungs every 4 (four) hours.   aspirin 81 MG tablet Take 81 mg by mouth daily.   azelastine  (ASTELIN ) 0.1 % nasal spray Place 1 spray into both nostrils 2 (two) times daily. Use in each nostril as directed   azelastine  (OPTIVAR ) 0.05 % ophthalmic solution 1 drop into affected eye Ophthalmic Twice a day for 30 days (Patient taking differently: Place 1 drop into both eyes as needed.)   beclomethasone (QVAR  REDIHALER) 80 MCG/ACT inhaler Inhale 1 puff into the lungs 2 (two) times daily.   cetirizine (ZYRTEC) 10 MG tablet Take 10 mg by mouth daily.   clobetasol  (TEMOVATE ) 0.05 % external solution Apply topically 2 (two) times daily.   EPINEPHrine  0.3 mg/0.3 mL IJ SOAJ injection INJ 0.3 ML IM ONCE FOR 1 DOSE   fluticasone  (FLONASE ) 50 MCG/ACT nasal spray INSTILL 2  SPRAYS IN EACH NOSTRIL EVERY DAY   GALLIFREY  5 MG tablet TAKE 1 TABLET(5 MG) BY MOUTH DAILY   ketoconazole (NIZORAL) 2 % shampoo Apply topically.   levocetirizine (XYZAL) 5 MG tablet 1 tablet in the evening Orally Once a day for 30 days   mometasone (ELOCON) 0.1 % cream 1 application Externally Once a day for 90 days   mupirocin  ointment (BACTROBAN ) 2 % Apply 1 Application topically 2 (two) times daily. (Patient taking differently: Apply 1 Application topically as needed.)   Probiotic Product (PROBIOTIC DAILY PO) Take 1 capsule by mouth daily.   promethazine  (PHENERGAN ) 25 MG tablet Take 1 tablet (25 mg total) by mouth every 6 (six) hours  as needed for nausea or vomiting.   SUMAtriptan  (IMITREX ) 100 MG tablet Take 1 tablet (100 mg total) by mouth every 2 (two) hours as needed for migraine. One tab at onset and may repeat x1 in 1 hour. Max 200 mg/24 hours   tiotropium (SPIRIVA  HANDIHALER) 18 MCG inhalation capsule Place 1 capsule (18 mcg total) into inhaler and inhale daily.   tirzepatide  (MOUNJARO ) 10 MG/0.5ML Pen Inject 10 mg into the skin once a week.   triamcinolone  ointment (KENALOG ) 0.5 % Apply 1 Application topically 2 (two) times daily.   [DISCONTINUED] ipratropium (ATROVENT ) 0.06 % nasal spray Place 2 sprays into both nostrils 4 (four) times daily as needed for rhinitis.   No current facility-administered medications on file prior to visit.    Allergies:  Allergies[1]  Social History:  Social History   Socioeconomic History   Marital status: Married    Spouse name: Not on file   Number of children: Not on file   Years of education: Not on file   Highest education level: 12th grade  Occupational History   Not on file  Tobacco Use   Smoking status: Former    Current packs/day: 0.00    Average packs/day: 0.5 packs/day for 12.0 years (6.0 ttl pk-yrs)    Types: Cigarettes    Start date: 08/24/2000    Quit date: 08/24/2012    Years since quitting: 12.1   Smokeless tobacco:  Never  Vaping Use   Vaping status: Never Used  Substance and Sexual Activity   Alcohol use: Not Currently   Drug use: Never   Sexual activity: Yes    Birth control/protection: None    Comment: Last encounter: [phone], Husband.  Other Topics Concern   Not on file  Social History Narrative   Not on file   Social Drivers of Health   Tobacco Use: Medium Risk (10/13/2024)   Patient History    Smoking Tobacco Use: Former    Smokeless Tobacco Use: Never    Passive Exposure: Not on file  Financial Resource Strain: Low Risk (08/14/2024)   Overall Financial Resource Strain (CARDIA)    Difficulty of Paying Living Expenses: Not hard at all  Food Insecurity: No Food Insecurity (08/14/2024)   Epic    Worried About Programme Researcher, Broadcasting/film/video in the Last Year: Never true    Ran Out of Food in the Last Year: Never true  Transportation Needs: No Transportation Needs (08/14/2024)   Epic    Lack of Transportation (Medical): No    Lack of Transportation (Non-Medical): No  Physical Activity: Sufficiently Active (08/14/2024)   Exercise Vital Sign    Days of Exercise per Week: 5 days    Minutes of Exercise per Session: 30 min  Stress: Stress Concern Present (08/14/2024)   [employer] of Occupational Health - Occupational Stress Questionnaire    Feeling of Stress: Rather much  Social Connections: Socially Integrated (08/14/2024)   Social Connection and Isolation Panel    Frequency of Communication with Friends and Family: Three times a week    Frequency of Social Gatherings with Friends and Family: Once a week    Attends Religious Services: More than 4 times per year    Active Member of Clubs or Organizations: Yes    Attends Banker Meetings: More than 4 times per year    Marital Status: Married  Catering Manager Violence: Not on file  Depression (PHQ2-9): Medium Risk (04/11/2024)   Depression (PHQ2-9)    PHQ-2 Score: 9  Alcohol Screen: Not on file  Housing: Low Risk  (08/14/2024)   Epic    Unable to Pay for Housing in the Last Year: No    Number of Times Moved in the Last Year: 0    Homeless in the Last Year: No  Utilities: Not on file  Health Literacy: Not on file   Tobacco Use History[2] Social History   Substance and Sexual Activity  Alcohol Use Not Currently    Family History:  Family History  Problem Relation Age of Onset   Diabetes Mother    Asthma Mother    Diabetes Father    Heart attack Father    Heart disease Father    Asthma Sister    Bipolar disorder Sister    Breast cancer Cousin    Asthma Brother    Bipolar disorder Brother    Epilepsy Brother     Past medical history, surgical history, medications, allergies, family history and social history reviewed with patient today and changes made to appropriate areas of the chart.   Review of Systems  Constitutional:  Positive for diaphoresis. Negative for chills, fever, malaise/fatigue and weight loss.  HENT: Negative.    Eyes: Negative.  Negative for blurred vision, double vision, photophobia, pain, discharge and redness.  Respiratory: Negative.    Cardiovascular: Negative.   Gastrointestinal: Negative.   Genitourinary: Negative.   Musculoskeletal:  Positive for joint pain (R elbow pain). Negative for back pain, falls, myalgias and neck pain.  Skin: Negative.   Neurological: Negative.   Endo/Heme/Allergies: Negative.   Psychiatric/Behavioral: Negative.     All other ROS negative except what is listed above and in the HPI.      Objective:    BP 134/80   Pulse 81   Temp 98 F (36.7 C) (Oral)   Ht 5' 7 (1.702 m)   Wt 207 lb 3.2 oz (94 kg)   LMP 03/24/2022   SpO2 98%   BMI 32.45 kg/m   Wt Readings from Last 3 Encounters:  10/13/24 207 lb 3.2 oz (94 kg)  09/20/24 206 lb 3.2 oz (93.5 kg)  08/18/24 208 lb (94.3 kg)    Physical Exam Vitals and nursing note reviewed.  Constitutional:      General: She is not in acute distress.    Appearance: Normal appearance.  She is not ill-appearing, toxic-appearing or diaphoretic.  HENT:     Head: Normocephalic and atraumatic.     Right Ear: Tympanic membrane, ear canal and external ear normal. There is no impacted cerumen.     Left Ear: Tympanic membrane, ear canal and external ear normal. There is no impacted cerumen.     Nose: Nose normal. No congestion or rhinorrhea.     Mouth/Throat:     Mouth: Mucous membranes are moist.     Pharynx: Oropharynx is clear. No oropharyngeal exudate or posterior oropharyngeal erythema.  Eyes:     General: No scleral icterus.       Right eye: No discharge.        Left eye: No discharge.     Extraocular Movements: Extraocular movements intact.     Conjunctiva/sclera: Conjunctivae normal.     Pupils: Pupils are equal, round, and reactive to light.  Neck:     Vascular: No carotid bruit.  Cardiovascular:     Rate and Rhythm: Normal rate and regular rhythm.     Pulses: Normal pulses.     Heart sounds: No murmur heard.    No friction rub.  No gallop.  Pulmonary:     Effort: Pulmonary effort is normal. No respiratory distress.     Breath sounds: Normal breath sounds. No stridor. No wheezing, rhonchi or rales.  Chest:     Chest wall: No tenderness.  Abdominal:     General: Abdomen is flat. Bowel sounds are normal. There is no distension.     Palpations: Abdomen is soft. There is no mass.     Tenderness: There is no abdominal tenderness. There is no right CVA tenderness, left CVA tenderness, guarding or rebound.     Hernia: No hernia is present.  Genitourinary:    Comments: Breast and pelvic exams deferred with shared decision making Musculoskeletal:        General: No swelling, tenderness, deformity or signs of injury.     Cervical back: Normal range of motion and neck supple. No rigidity. No muscular tenderness.     Right lower leg: No edema.     Left lower leg: No edema.  Lymphadenopathy:     Cervical: No cervical adenopathy.  Skin:    General: Skin is warm and dry.      Capillary Refill: Capillary refill takes less than 2 seconds.     Coloration: Skin is not jaundiced or pale.     Findings: No bruising, erythema, lesion or rash.  Neurological:     General: No focal deficit present.     Mental Status: She is alert and oriented to person, place, and time. Mental status is at baseline.     Cranial Nerves: No cranial nerve deficit.     Sensory: No sensory deficit.     Motor: No weakness.     Coordination: Coordination normal.     Gait: Gait normal.     Deep Tendon Reflexes: Reflexes normal.  Psychiatric:        Mood and Affect: Mood normal.        Behavior: Behavior normal.        Thought Content: Thought content normal.        Judgment: Judgment normal.     Results for orders placed or performed in visit on 09/20/24  Rapid Strep screen([facility])   Collection Time: 09/20/24 11:00 AM   Specimen: Other   Other  Result Value Ref Range   Strep Gp A Ag, IA W/Reflex Negative Negative  Culture, Group A Strep   Collection Time: 09/20/24 11:00 AM   Other  Result Value Ref Range   Strep A Culture Negative   Influenza A & B (STAT)   Collection Time: 09/20/24 11:00 AM  Result Value Ref Range   Influenza A Negative Negative   Influenza B Negative Negative  POC COVID-19   Collection Time: 09/20/24 11:35 AM  Result Value Ref Range   SARS Coronavirus 2 Ag Negative Negative      Assessment & Plan:   Problem List Items Addressed This Visit       Cardiovascular and Mediastinum   Primary hypertension   Under good control on current regimen. Continue current regimen. Continue to monitor. Call with any concerns. Refills given. Labs drawn today.        Relevant Medications   losartan  (COZAAR ) 25 MG tablet     Respiratory   OSA (obstructive sleep apnea)   Tolerating her CPAP well. Using it nightly. Feeling so much better! Continue CPAP. Call with any concerns. Continue mounjaro .         Endocrine   Controlled type 2 diabetes mellitus  without complication,  without long-term current use of insulin (HCC)   Tolerating switch to mounjaro  well. A1c down to 6.7. Will continue current regimen and recheck 3 months- will consider increase at that time.       Relevant Medications   losartan  (COZAAR ) 25 MG tablet   Other Relevant Orders   Bayer DCA Hb A1c Waived   Microalbumin, Urine Waived     Other   Anxiety   Under good control on current regimen. Continue current regimen. Continue to monitor. Call with any concerns. Refills given.        Relevant Medications   citalopram  (CELEXA ) 20 MG tablet   Other Visit Diagnoses       Routine general medical examination at a health care facility    -  Primary   Vaccines up to date. Screening labs checked today. Pap N/A. Mammo scheduled. Continue diet and exercise. Call with any concerns.   Relevant Orders   CBC with Differential/Platelet   Comprehensive metabolic panel with GFR   Lipid Panel w/o Chol/HDL Ratio   TSH   Bayer DCA Hb A1c Waived   Microalbumin, Urine Waived     Right elbow pain       Concern for tennis elbow. Will start stretches and voltaren . Call if not getting better or getting worse.     Not immune to hepatitis B virus       Hep B #1 given today.   Relevant Orders   Heplisav-B  (HepB-CPG) Vaccine (Completed)        Follow up plan: Return in about 3 months (around 01/11/2025).   LABORATORY TESTING:  - Pap smear: N/A  IMMUNIZATIONS:   - Tdap: Tetanus vaccination status reviewed: last tetanus booster within 10 years. - Influenza: Refused - Pneumovax: Up to date - Prevnar: Up to date - COVID: Refused - HPV: Up to date - Shingrix vaccine: Not applicable  SCREENING: -Mammogram: Scheduled    PATIENT COUNSELING:   Advised to take 1 mg of folate supplement per day if capable of pregnancy.   Sexuality: Discussed sexually transmitted diseases, partner selection, use of condoms, avoidance of unintended pregnancy  and contraceptive alternatives.    Advised to avoid cigarette smoking.  I discussed with the patient that most people either abstain from alcohol or drink within safe limits (<=14/week and <=4 drinks/occasion for males, <=7/weeks and <= 3 drinks/occasion for females) and that the risk for alcohol disorders and other health effects rises proportionally with the number of drinks per week and how often a drinker exceeds daily limits.  Discussed cessation/primary prevention of drug use and availability of treatment for abuse.   Diet: Encouraged to adjust caloric intake to maintain  or achieve ideal body weight, to reduce intake of dietary saturated fat and total fat, to limit sodium intake by avoiding high sodium foods and not adding table salt, and to maintain adequate dietary potassium and calcium preferably from fresh fruits, vegetables, and low-fat dairy products.    stressed the importance of regular exercise  Injury prevention: Discussed safety belts, safety helmets, smoke detector, smoking near bedding or upholstery.   Dental health: Discussed importance of regular tooth brushing, flossing, and dental visits.    NEXT PREVENTATIVE PHYSICAL DUE IN 1 YEAR. Return in about 3 months (around 01/11/2025).              [1]  Allergies Allergen Reactions   Hydrocodone  Hives and Itching   Tussionex Pennkinetic Er [Hydrocod Poli-Chlorphe Poli Er] Hives and Itching   Bee Venom  Hives   Black Walnut Pollen Allergy Skin Test Hives and Swelling   Dust Mite Extract    Metformin And Related Diarrhea  [2]  Social History Tobacco Use  Smoking Status Former   Current packs/day: 0.00   Average packs/day: 0.5 packs/day for 12.0 years (6.0 ttl pk-yrs)   Types: Cigarettes   Start date: 08/24/2000   Quit date: 08/24/2012   Years since quitting: 12.1  Smokeless Tobacco Never   "

## 2024-10-13 NOTE — Assessment & Plan Note (Signed)
 Tolerating her CPAP well. Using it nightly. Feeling so much better! Continue CPAP. Call with any concerns. Continue mounjaro .

## 2024-10-13 NOTE — Assessment & Plan Note (Signed)
 Under good control on current regimen. Continue current regimen. Continue to monitor. Call with any concerns. Refills given.

## 2024-10-13 NOTE — Assessment & Plan Note (Addendum)
 Tolerating switch to mounjaro  well. A1c down to 6.7. Will continue current regimen and recheck 3 months- will consider increase at that time.

## 2024-10-14 ENCOUNTER — Encounter: Payer: Self-pay | Admitting: Family Medicine

## 2024-10-14 LAB — LIPID PANEL W/O CHOL/HDL RATIO
Cholesterol, Total: 185 mg/dL (ref 100–199)
HDL: 30 mg/dL — ABNORMAL LOW
LDL Chol Calc (NIH): 130 mg/dL — ABNORMAL HIGH (ref 0–99)
Triglycerides: 138 mg/dL (ref 0–149)
VLDL Cholesterol Cal: 25 mg/dL (ref 5–40)

## 2024-10-14 LAB — CBC WITH DIFFERENTIAL/PLATELET
Basophils Absolute: 0.1 x10E3/uL (ref 0.0–0.2)
Basos: 1 %
EOS (ABSOLUTE): 0.1 x10E3/uL (ref 0.0–0.4)
Eos: 1 %
Hematocrit: 35.1 % (ref 34.0–46.6)
Hemoglobin: 10.9 g/dL — ABNORMAL LOW (ref 11.1–15.9)
Immature Grans (Abs): 0 x10E3/uL (ref 0.0–0.1)
Immature Granulocytes: 0 %
Lymphocytes Absolute: 2.9 x10E3/uL (ref 0.7–3.1)
Lymphs: 30 %
MCH: 23.5 pg — ABNORMAL LOW (ref 26.6–33.0)
MCHC: 31.1 g/dL — ABNORMAL LOW (ref 31.5–35.7)
MCV: 76 fL — ABNORMAL LOW (ref 79–97)
Monocytes Absolute: 0.5 x10E3/uL (ref 0.1–0.9)
Monocytes: 5 %
Neutrophils Absolute: 6.2 x10E3/uL (ref 1.4–7.0)
Neutrophils: 63 %
Platelets: 551 x10E3/uL — ABNORMAL HIGH (ref 150–450)
RBC: 4.63 x10E6/uL (ref 3.77–5.28)
RDW: 15.5 % — ABNORMAL HIGH (ref 11.7–15.4)
WBC: 9.8 x10E3/uL (ref 3.4–10.8)

## 2024-10-14 LAB — COMPREHENSIVE METABOLIC PANEL WITH GFR
ALT: 28 IU/L (ref 0–32)
AST: 19 IU/L (ref 0–40)
Albumin: 4.4 g/dL (ref 3.9–4.9)
Alkaline Phosphatase: 111 IU/L (ref 41–116)
BUN/Creatinine Ratio: 5 — ABNORMAL LOW (ref 9–23)
BUN: 4 mg/dL — ABNORMAL LOW (ref 6–24)
Bilirubin Total: 0.4 mg/dL (ref 0.0–1.2)
CO2: 20 mmol/L (ref 20–29)
Calcium: 9.1 mg/dL (ref 8.7–10.2)
Chloride: 100 mmol/L (ref 96–106)
Creatinine, Ser: 0.75 mg/dL (ref 0.57–1.00)
Globulin, Total: 2 g/dL (ref 1.5–4.5)
Glucose: 150 mg/dL — ABNORMAL HIGH (ref 70–99)
Potassium: 4.5 mmol/L (ref 3.5–5.2)
Sodium: 135 mmol/L (ref 134–144)
Total Protein: 6.4 g/dL (ref 6.0–8.5)
eGFR: 101 mL/min/1.73

## 2024-10-14 LAB — TSH: TSH: 1.23 u[IU]/mL (ref 0.450–4.500)

## 2024-10-15 ENCOUNTER — Ambulatory Visit: Payer: Self-pay | Admitting: Family Medicine

## 2024-10-15 DIAGNOSIS — D649 Anemia, unspecified: Secondary | ICD-10-CM

## 2024-10-15 MED ORDER — IRON (FERROUS SULFATE) 325 (65 FE) MG PO TABS: 325.0000 mg | ORAL_TABLET | Freq: Every day | ORAL | 3 refills | Status: AC

## 2024-10-16 ENCOUNTER — Other Ambulatory Visit

## 2024-10-16 DIAGNOSIS — Z1231 Encounter for screening mammogram for malignant neoplasm of breast: Secondary | ICD-10-CM

## 2024-10-16 DIAGNOSIS — D649 Anemia, unspecified: Secondary | ICD-10-CM

## 2024-10-16 NOTE — Progress Notes (Signed)
 Lab appt scheduled.

## 2024-10-17 LAB — FERRITIN: Ferritin: 15 ng/mL (ref 15–150)

## 2024-10-17 LAB — CBC WITH DIFFERENTIAL/PLATELET
Basophils Absolute: 0.1 x10E3/uL (ref 0.0–0.2)
Basos: 0 %
EOS (ABSOLUTE): 0.1 x10E3/uL (ref 0.0–0.4)
Eos: 1 %
Hematocrit: 44.5 % (ref 34.0–46.6)
Hemoglobin: 14 g/dL (ref 11.1–15.9)
Immature Grans (Abs): 0.1 x10E3/uL (ref 0.0–0.1)
Immature Granulocytes: 1 %
Lymphocytes Absolute: 1.8 x10E3/uL (ref 0.7–3.1)
Lymphs: 8 %
MCH: 24.2 pg — ABNORMAL LOW (ref 26.6–33.0)
MCHC: 31.5 g/dL (ref 31.5–35.7)
MCV: 77 fL — ABNORMAL LOW (ref 79–97)
Monocytes Absolute: 1.3 x10E3/uL — ABNORMAL HIGH (ref 0.1–0.9)
Monocytes: 6 %
Neutrophils Absolute: 20.4 x10E3/uL — ABNORMAL HIGH (ref 1.4–7.0)
Neutrophils: 84 %
Platelets: 705 x10E3/uL — ABNORMAL HIGH (ref 150–450)
RBC: 5.79 x10E6/uL — ABNORMAL HIGH (ref 3.77–5.28)
RDW: 16.2 % — ABNORMAL HIGH (ref 11.7–15.4)
WBC: 23.8 x10E3/uL (ref 3.4–10.8)

## 2024-10-17 LAB — IRON AND TIBC
Iron Saturation: 8 % — CL (ref 15–55)
Iron: 39 ug/dL (ref 27–159)
Total Iron Binding Capacity: 489 ug/dL — ABNORMAL HIGH (ref 250–450)
UIBC: 450 ug/dL — ABNORMAL HIGH (ref 131–425)

## 2024-10-17 LAB — VITAMIN B12: Vitamin B-12: 372 pg/mL (ref 232–1245)

## 2024-10-19 ENCOUNTER — Ambulatory Visit
Admission: RE | Admit: 2024-10-19 | Discharge: 2024-10-19 | Disposition: A | Discharge status: Home / Self Care | Source: Ambulatory Visit | Attending: Family Medicine | Admitting: Family Medicine

## 2024-10-19 DIAGNOSIS — Z1231 Encounter for screening mammogram for malignant neoplasm of breast: Secondary | ICD-10-CM | POA: Diagnosis present

## 2024-10-21 ENCOUNTER — Ambulatory Visit: Payer: Self-pay | Admitting: Family Medicine

## 2025-01-11 ENCOUNTER — Ambulatory Visit: Admitting: Family Medicine

## 2025-01-16 ENCOUNTER — Ambulatory Visit: Admitting: Family Medicine
